# Patient Record
Sex: Male | Born: 1945 | Race: Black or African American | Hispanic: No | Marital: Married | Smoking: Former smoker
Health system: Southern US, Community
[De-identification: ages and names within clinical notes are randomized; demographics above are authoritative.]

## PROBLEM LIST (undated history)

## (undated) DIAGNOSIS — E785 Hyperlipidemia, unspecified: Secondary | ICD-10-CM

## (undated) DIAGNOSIS — M1990 Unspecified osteoarthritis, unspecified site: Secondary | ICD-10-CM

## (undated) DIAGNOSIS — F4310 Post-traumatic stress disorder, unspecified: Secondary | ICD-10-CM

## (undated) DIAGNOSIS — Z9989 Dependence on other enabling machines and devices: Secondary | ICD-10-CM

## (undated) DIAGNOSIS — G4733 Obstructive sleep apnea (adult) (pediatric): Secondary | ICD-10-CM

## (undated) DIAGNOSIS — H9190 Unspecified hearing loss, unspecified ear: Secondary | ICD-10-CM

## (undated) DIAGNOSIS — I10 Essential (primary) hypertension: Secondary | ICD-10-CM

## (undated) DIAGNOSIS — M109 Gout, unspecified: Secondary | ICD-10-CM

## (undated) DIAGNOSIS — C801 Malignant (primary) neoplasm, unspecified: Secondary | ICD-10-CM

## (undated) DIAGNOSIS — R0602 Shortness of breath: Secondary | ICD-10-CM

## (undated) HISTORY — PX: TONSILLECTOMY: SUR1361

## (undated) HISTORY — PX: SHOULDER OPEN ROTATOR CUFF REPAIR: SHX2407

## (undated) HISTORY — PX: JOINT REPLACEMENT: SHX530

## (undated) HISTORY — PX: TOTAL KNEE ARTHROPLASTY: SHX125

---

## 1968-12-21 HISTORY — PX: INGUINAL HERNIA REPAIR: SUR1180

## 2002-01-20 ENCOUNTER — Ambulatory Visit (HOSPITAL_COMMUNITY): Admission: RE | Admit: 2002-01-20 | Discharge: 2002-01-20 | Payer: Self-pay | Admitting: Gastroenterology

## 2002-04-01 ENCOUNTER — Ambulatory Visit (HOSPITAL_COMMUNITY): Admission: RE | Admit: 2002-04-01 | Discharge: 2002-04-01 | Payer: Self-pay | Admitting: Internal Medicine

## 2002-04-01 ENCOUNTER — Encounter: Payer: Self-pay | Admitting: Internal Medicine

## 2002-04-09 ENCOUNTER — Encounter: Admission: RE | Admit: 2002-04-09 | Discharge: 2002-04-09 | Payer: Self-pay | Admitting: Internal Medicine

## 2002-04-09 ENCOUNTER — Encounter: Payer: Self-pay | Admitting: Internal Medicine

## 2002-07-26 ENCOUNTER — Encounter: Payer: Self-pay | Admitting: Specialist

## 2002-07-28 ENCOUNTER — Observation Stay (HOSPITAL_COMMUNITY): Admission: RE | Admit: 2002-07-28 | Discharge: 2002-07-29 | Payer: Self-pay | Admitting: Specialist

## 2005-02-05 ENCOUNTER — Ambulatory Visit (HOSPITAL_BASED_OUTPATIENT_CLINIC_OR_DEPARTMENT_OTHER): Admission: RE | Admit: 2005-02-05 | Discharge: 2005-02-05 | Payer: Self-pay | Admitting: Internal Medicine

## 2005-02-17 ENCOUNTER — Ambulatory Visit: Payer: Self-pay | Admitting: Internal Medicine

## 2005-03-27 ENCOUNTER — Ambulatory Visit (HOSPITAL_COMMUNITY): Admission: RE | Admit: 2005-03-27 | Discharge: 2005-03-27 | Payer: Self-pay | Admitting: Urology

## 2006-04-22 DIAGNOSIS — C801 Malignant (primary) neoplasm, unspecified: Secondary | ICD-10-CM

## 2006-04-22 HISTORY — PX: ROBOT ASSISTED LAPAROSCOPIC RADICAL PROSTATECTOMY: SHX5141

## 2006-04-22 HISTORY — DX: Malignant (primary) neoplasm, unspecified: C80.1

## 2006-10-13 ENCOUNTER — Emergency Department (HOSPITAL_COMMUNITY): Admission: EM | Admit: 2006-10-13 | Discharge: 2006-10-13 | Payer: Self-pay | Admitting: Emergency Medicine

## 2006-10-27 ENCOUNTER — Ambulatory Visit (HOSPITAL_COMMUNITY): Admission: RE | Admit: 2006-10-27 | Discharge: 2006-10-27 | Payer: Self-pay | Admitting: Urology

## 2006-11-20 ENCOUNTER — Ambulatory Visit (HOSPITAL_COMMUNITY): Admission: RE | Admit: 2006-11-20 | Discharge: 2006-11-21 | Payer: Self-pay | Admitting: Specialist

## 2007-04-06 ENCOUNTER — Ambulatory Visit (HOSPITAL_COMMUNITY): Admission: RE | Admit: 2007-04-06 | Discharge: 2007-04-06 | Payer: Self-pay | Admitting: Urology

## 2007-06-12 ENCOUNTER — Ambulatory Visit (HOSPITAL_COMMUNITY): Admission: RE | Admit: 2007-06-12 | Discharge: 2007-06-13 | Payer: Self-pay | Admitting: Specialist

## 2007-08-12 ENCOUNTER — Inpatient Hospital Stay (HOSPITAL_COMMUNITY): Admission: RE | Admit: 2007-08-12 | Discharge: 2007-08-13 | Payer: Self-pay | Admitting: Urology

## 2007-08-12 ENCOUNTER — Encounter (INDEPENDENT_AMBULATORY_CARE_PROVIDER_SITE_OTHER): Payer: Self-pay | Admitting: Urology

## 2007-09-30 ENCOUNTER — Ambulatory Visit: Admission: RE | Admit: 2007-09-30 | Discharge: 2007-12-29 | Payer: Self-pay | Admitting: Radiation Oncology

## 2007-12-30 ENCOUNTER — Ambulatory Visit: Admission: RE | Admit: 2007-12-30 | Discharge: 2008-01-19 | Payer: Self-pay | Admitting: Radiation Oncology

## 2008-02-14 IMAGING — CR DG CHEST 2V
2 series · 2 of 2 positions shown · non-contrast
Comparison: 07/26/02.

CLINICAL DATA: Preop left rotator cuff tear.
 CHEST - 2 VIEW:

[view not recorded (1 of 2)]
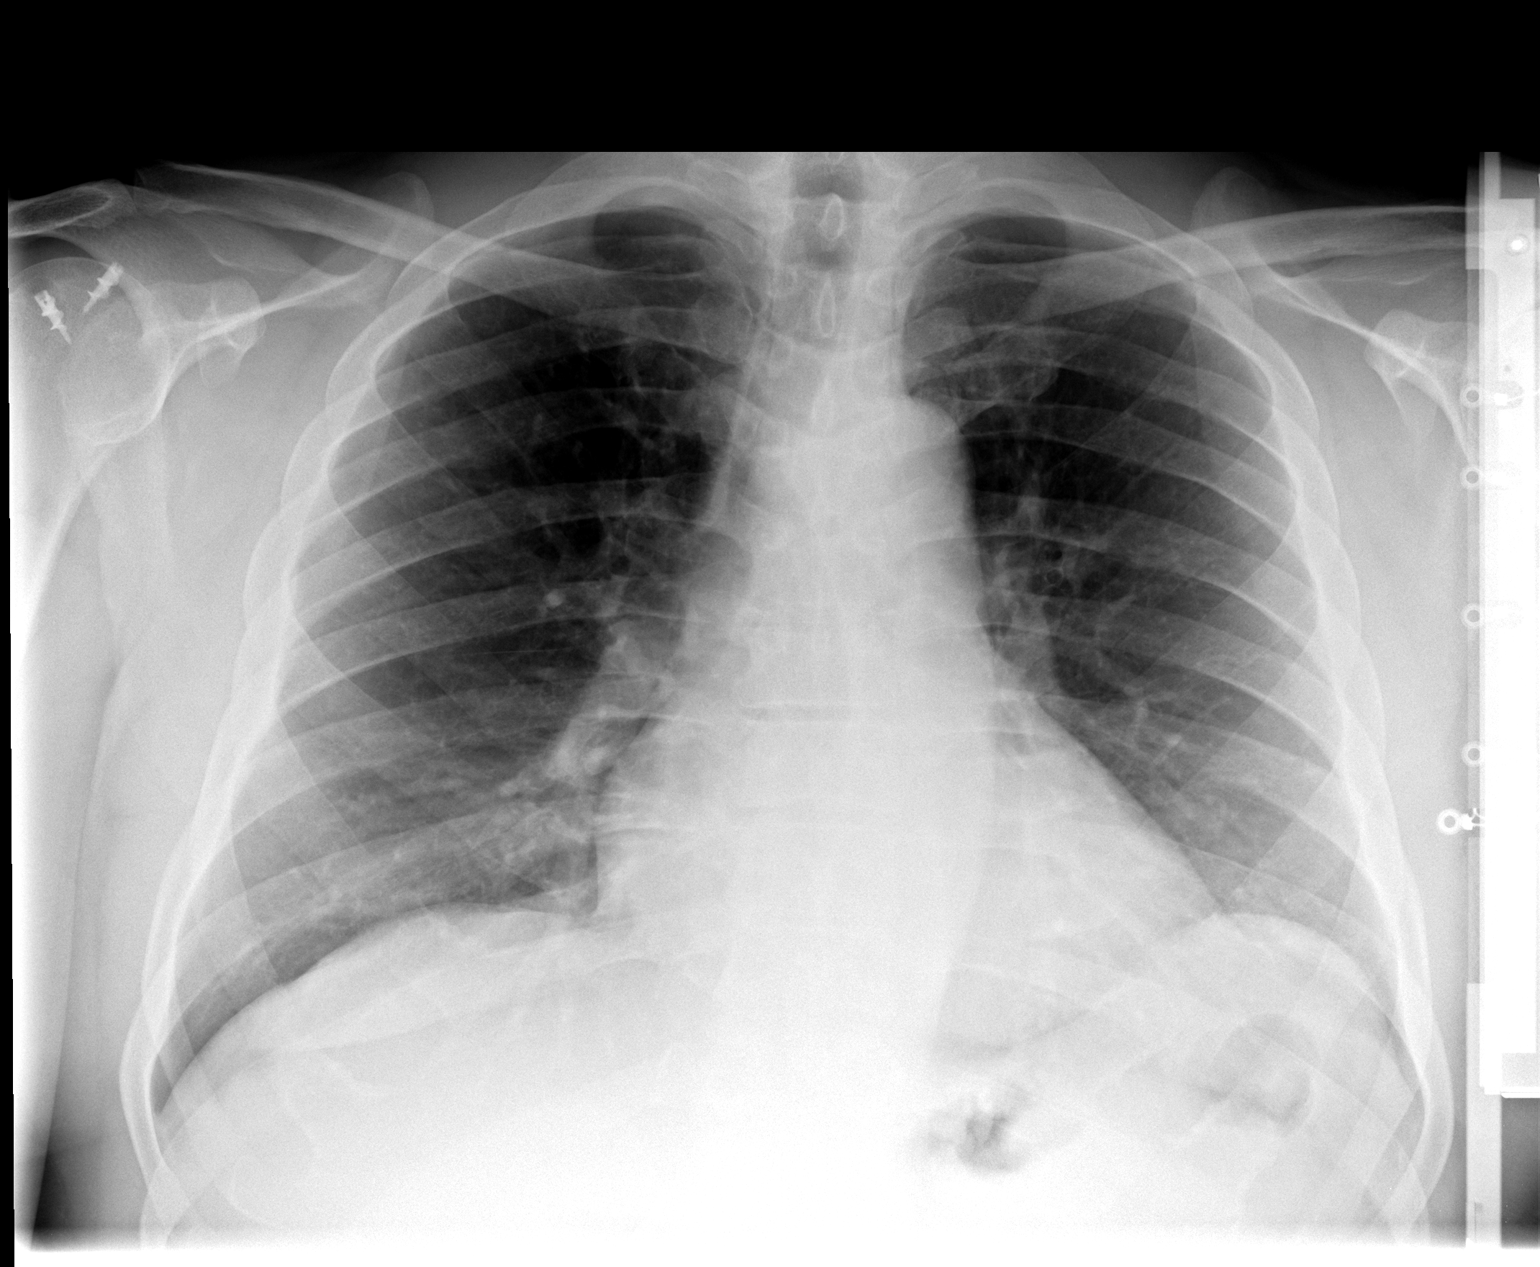

[view not recorded (2 of 2)]
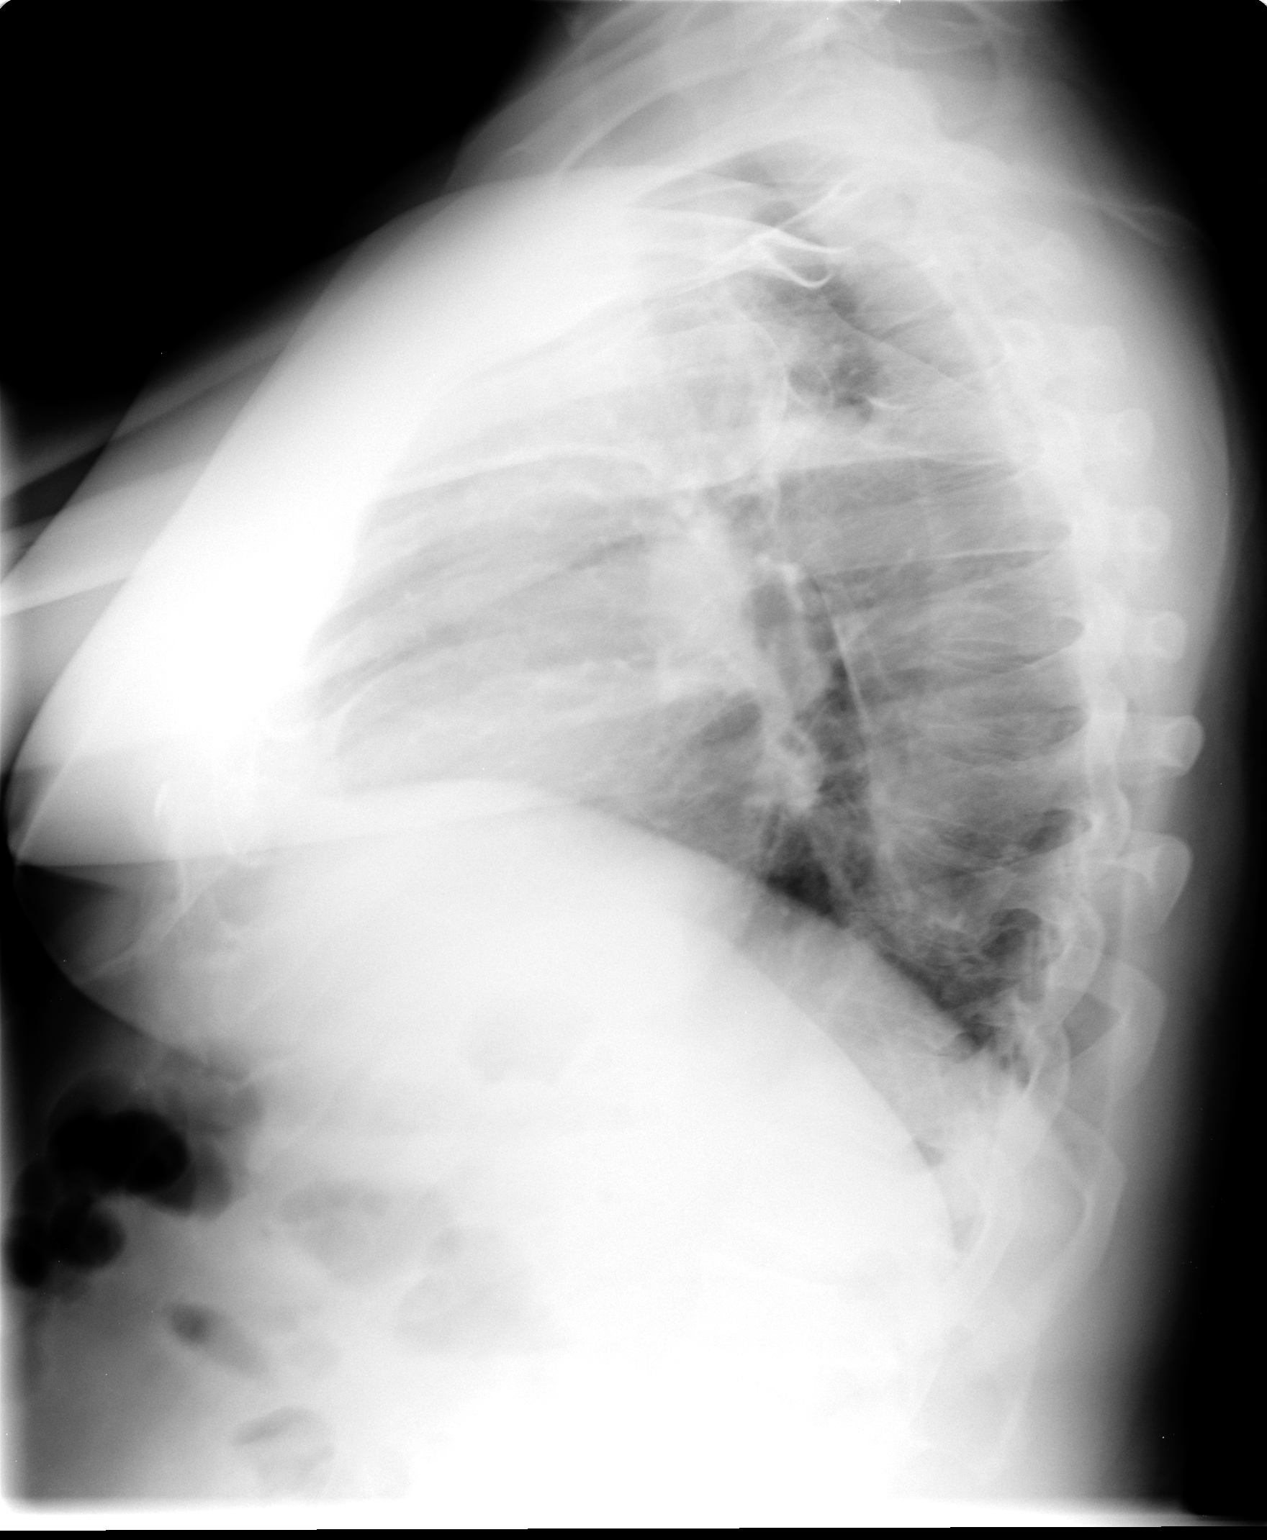

[2 of 2 positions shown; findings below may reference images not displayed]

FINDINGS: Trachea is midline. Heart size stable. Lungs are clear. No pleural fluid.
IMPRESSION: No acute findings.

## 2008-11-16 ENCOUNTER — Inpatient Hospital Stay (HOSPITAL_COMMUNITY): Admission: RE | Admit: 2008-11-16 | Discharge: 2008-11-18 | Payer: Self-pay | Admitting: Orthopedic Surgery

## 2010-05-14 ENCOUNTER — Encounter: Payer: Self-pay | Admitting: Orthopedic Surgery

## 2010-05-25 ENCOUNTER — Encounter (HOSPITAL_COMMUNITY): Payer: Medicare Other

## 2010-05-25 ENCOUNTER — Other Ambulatory Visit: Payer: Self-pay | Admitting: Orthopedic Surgery

## 2010-05-25 DIAGNOSIS — Z01812 Encounter for preprocedural laboratory examination: Secondary | ICD-10-CM | POA: Insufficient documentation

## 2010-05-25 LAB — COMPREHENSIVE METABOLIC PANEL
AST: 17 U/L (ref 0–37)
Albumin: 4.3 g/dL (ref 3.5–5.2)
Alkaline Phosphatase: 45 U/L (ref 39–117)
Chloride: 102 mEq/L (ref 96–112)
GFR calc Af Amer: 56 mL/min — ABNORMAL LOW (ref >60)
Potassium: 4.1 mEq/L (ref 3.5–5.1)
Sodium: 140 mEq/L (ref 135–145)
Total Bilirubin: 0.5 mg/dL (ref 0.3–1.2)
Total Protein: 7 g/dL (ref 6.0–8.3)

## 2010-05-25 LAB — URINALYSIS, ROUTINE W REFLEX MICROSCOPIC
Bilirubin Urine: NEGATIVE
Hgb urine dipstick: NEGATIVE
Ketones, ur: NEGATIVE mg/dL
Specific Gravity, Urine: 1.009 (ref 1.005–1.030)
Urine Glucose, Fasting: NEGATIVE mg/dL
pH: 6 (ref 5.0–8.0)

## 2010-05-25 LAB — CBC
HCT: 41.2 % (ref 39.0–52.0)
Hemoglobin: 14.1 g/dL (ref 13.0–17.0)
MCH: 27.6 pg (ref 26.0–34.0)
MCV: 80.8 fL (ref 78.0–100.0)
Platelets: 166 10*3/uL (ref 150–400)
RBC: 5.1 MIL/uL (ref 4.22–5.81)

## 2010-05-25 LAB — APTT: aPTT: 35 seconds (ref 24–37)

## 2010-05-25 LAB — PROTIME-INR: INR: 1.07 (ref 0.00–1.49)

## 2010-05-30 ENCOUNTER — Other Ambulatory Visit (HOSPITAL_COMMUNITY): Payer: Self-pay | Admitting: Orthopedic Surgery

## 2010-05-30 ENCOUNTER — Inpatient Hospital Stay (HOSPITAL_COMMUNITY)
Admission: RE | Admit: 2010-05-30 | Discharge: 2010-06-01 | DRG: 470 | Disposition: A | Discharge status: Home Health | Payer: Medicare Other | Source: Ambulatory Visit | Attending: Orthopedic Surgery | Admitting: Orthopedic Surgery

## 2010-05-30 ENCOUNTER — Inpatient Hospital Stay (HOSPITAL_COMMUNITY): Payer: Medicare Other

## 2010-05-30 ENCOUNTER — Ambulatory Visit (HOSPITAL_COMMUNITY)
Admission: RE | Admit: 2010-05-30 | Discharge: 2010-05-30 | Disposition: A | Discharge status: Home / Self Care | Payer: Medicare Other | Source: Ambulatory Visit | Attending: Orthopedic Surgery | Admitting: Orthopedic Surgery

## 2010-05-30 DIAGNOSIS — M1710 Unilateral primary osteoarthritis, unspecified knee: Principal | ICD-10-CM | POA: Diagnosis present

## 2010-05-30 DIAGNOSIS — M109 Gout, unspecified: Secondary | ICD-10-CM | POA: Diagnosis present

## 2010-05-30 DIAGNOSIS — F329 Major depressive disorder, single episode, unspecified: Secondary | ICD-10-CM | POA: Diagnosis present

## 2010-05-30 DIAGNOSIS — Z8546 Personal history of malignant neoplasm of prostate: Secondary | ICD-10-CM

## 2010-05-30 DIAGNOSIS — F3289 Other specified depressive episodes: Secondary | ICD-10-CM | POA: Diagnosis present

## 2010-05-30 DIAGNOSIS — I10 Essential (primary) hypertension: Secondary | ICD-10-CM | POA: Diagnosis present

## 2010-05-30 DIAGNOSIS — Z01812 Encounter for preprocedural laboratory examination: Secondary | ICD-10-CM

## 2010-05-30 DIAGNOSIS — F4310 Post-traumatic stress disorder, unspecified: Secondary | ICD-10-CM | POA: Diagnosis present

## 2010-05-30 DIAGNOSIS — Z96659 Presence of unspecified artificial knee joint: Secondary | ICD-10-CM

## 2010-05-30 LAB — TYPE AND SCREEN
ABO/RH(D): A POS
ABO/RH(D): A POS
Antibody Screen: NEGATIVE
Antibody Screen: NEGATIVE

## 2010-05-31 LAB — CBC
MCH: 26.5 pg (ref 26.0–34.0)
MCV: 80.6 fL (ref 78.0–100.0)
Platelets: 140 10*3/uL — ABNORMAL LOW (ref 150–400)
RBC: 4.53 MIL/uL (ref 4.22–5.81)
RDW: 13.2 % (ref 11.5–15.5)
WBC: 6.5 10*3/uL (ref 4.0–10.5)

## 2010-05-31 LAB — BASIC METABOLIC PANEL
CO2: 31 mEq/L (ref 19–32)
Calcium: 8.8 mg/dL (ref 8.4–10.5)
Creatinine, Ser: 1.28 mg/dL (ref 0.4–1.5)
GFR calc Af Amer: >60 mL/min (ref >60)
GFR calc non Af Amer: 57 mL/min — ABNORMAL LOW (ref >60)
Glucose, Bld: 142 mg/dL — ABNORMAL HIGH (ref 70–99)
Sodium: 135 mEq/L (ref 135–145)

## 2010-05-31 LAB — PROTIME-INR: Prothrombin Time: 15.9 seconds — ABNORMAL HIGH (ref 11.6–15.2)

## 2010-06-01 LAB — BASIC METABOLIC PANEL
BUN: 9 mg/dL (ref 6–23)
Calcium: 8.9 mg/dL (ref 8.4–10.5)
Creatinine, Ser: 1.16 mg/dL (ref 0.4–1.5)
GFR calc non Af Amer: >60 mL/min (ref >60)
Glucose, Bld: 117 mg/dL — ABNORMAL HIGH (ref 70–99)

## 2010-06-01 LAB — CBC
Hemoglobin: 12 g/dL — ABNORMAL LOW (ref 13.0–17.0)
MCH: 27 pg (ref 26.0–34.0)
MCHC: 33.5 g/dL (ref 30.0–36.0)
Platelets: 131 10*3/uL — ABNORMAL LOW (ref 150–400)
RDW: 13 % (ref 11.5–15.5)

## 2010-06-01 LAB — PROTIME-INR
INR: 1.24 (ref 0.00–1.49)
Prothrombin Time: 15.8 seconds — ABNORMAL HIGH (ref 11.6–15.2)

## 2010-06-11 NOTE — Op Note (Signed)
NAME:  Mathew Malone, Mathew Malone NO.:  [id number]  MEDICAL RECORD NO.:  [id number]           PATIENT TYPE:  I  LOCATION:  5004                         FACILITY:  [facility]  PHYSICIAN:  Loreta Ave, M.D. DATE OF BIRTH:  1946-04-08  DATE OF PROCEDURE:  05/30/2010 DATE OF DISCHARGE:                              OPERATIVE REPORT   PREOPERATIVE DIAGNOSIS:  End-stage degenerative arthritis, left knee varus alignment.  POSTOPERATIVE DIAGNOSIS:  End-stage degenerative arthritis, left knee varus alignment.  PROCEDURE:  Modified minimally invasive left total knee replacement. Soft tissue balancing.  Stryker triathlon prosthesis.  Cemented pegged posterior stabilized #6 femoral component.  Cemented #6 tibial component, 9-mm polyethylene insert.  Cemented resurfacing 38-mm patellar component.  SURGEON:  Loreta Ave, MD  ASSISTANT:  Genene Churn. Barry Dienes, [place], present throughout the entire case and necessary for timely completion of procedure.  ANESTHESIA:  General.  BLOOD LOSS:  Minimal.  SPECIMENS:  None.  CULTURES:  None.  COMPLICATIONS:  None.  DRESSING:  Soft compressive knee immobilizer.  DRAIN:  Hemovac x1.  TOURNIQUET TIME:  One hour and 10 minutes.  PROCEDURE:  The patient brought to the operating room, and after adequate anesthesia had been obtained, knee examined.  Just about full extension, varus alignment, correctable, flexion better than 100 degree. Tourniquet applied.  Prepped and draped in usual sterile fashion. Exsanguinated with elevation Esmarch, tourniquet inflated to 350 mmHg. Straight incision above the patella down to tibial tubercle.  Medial arthrotomy vastus splitting preserving quad tendon.  Remnants of menisci, periarticular spurs, cruciate ligaments removed.  Distal femur exposed.  Intramedullary guide.  A 10-mm resection, 5 degrees of valgus. Using epicondylar axis, sized, cut, and fitted for a posterior stabilized #6 component.   Proximal tibial resection extramedullary guide 3-degree posterior slope cut.  Sized to a #6 component as well.  Medial capsule release had already been done.  Patella exposed, posterior 10 mm removed.  Drilled, sized, and fitted for a 38-mm component.  Trial was put in place.  The #6 component on the femur, #6 on the tibia, and a 9- mm insert.  A 38 mm of the patella.  Tibia was marked for rotation. Very pleased with mechanical axis, alignment, stability, patellar tracking.  All trials had been removed and the tibia punched, rotation with several trials.  Copious irrigation with the pulse irrigating device.  Cement prepared and placed on all components, firmly seated. Polyethylene was attached to tibia, knee reduced.  Patella held with a clamp.  After the cement hardened, we examined.  Again pleased with mechanical axis, alignment, stability, motion, and tracking.  Hemovac placed through a separate stab wound.  Arthrotomy closed with #1 Vicryl. Skin and subcutaneous tissue with Vicryl and staples.  Knee injected with Marcaine and Hemovac clamp.  Sterile compressive dressing applied. Tourniquet deflated and removed.  Knee immobilizer applied.  Anesthesia reversed.  Brought to recovery room.  Tolerated surgery well.  No complications.     Loreta Ave, M.D.     DFM/MEDQ  D:  05/30/2010  T:  05/31/2010  Job:  [id number]  Electronically Signed by Reuel Boom  Enrika Aguado M.D. on 06/11/2010 02:58:55 PM

## 2010-07-29 LAB — APTT: aPTT: 42 seconds — ABNORMAL HIGH (ref 24–37)

## 2010-07-29 LAB — CBC
HCT: 32.5 % — ABNORMAL LOW (ref 39.0–52.0)
HCT: 34.8 % — ABNORMAL LOW (ref 39.0–52.0)
HCT: 40.8 % (ref 39.0–52.0)
Hemoglobin: 11.8 g/dL — ABNORMAL LOW (ref 13.0–17.0)
MCHC: 33.9 g/dL (ref 30.0–36.0)
MCHC: 33.9 g/dL (ref 30.0–36.0)
MCV: 85.9 fL (ref 78.0–100.0)
MCV: 86.3 fL (ref 78.0–100.0)
Platelets: 138 10*3/uL — ABNORMAL LOW (ref 150–400)
Platelets: 180 10*3/uL (ref 150–400)
RDW: 13.3 % (ref 11.5–15.5)
RDW: 13.3 % (ref 11.5–15.5)
WBC: 5.8 10*3/uL (ref 4.0–10.5)

## 2010-07-29 LAB — DIFFERENTIAL
Basophils Relative: 0 % (ref 0–1)
Eosinophils Relative: 2 % (ref 0–5)
Monocytes Absolute: 0.5 10*3/uL (ref 0.1–1.0)
Monocytes Relative: 8 % (ref 3–12)
Neutro Abs: 4.3 10*3/uL (ref 1.7–7.7)

## 2010-07-29 LAB — COMPREHENSIVE METABOLIC PANEL
AST: 24 U/L (ref 0–37)
Albumin: 4.4 g/dL (ref 3.5–5.2)
Alkaline Phosphatase: 46 U/L (ref 39–117)
BUN: 13 mg/dL (ref 6–23)
Chloride: 101 mEq/L (ref 96–112)
GFR calc Af Amer: >60 mL/min (ref >60)
Potassium: 4.2 mEq/L (ref 3.5–5.1)
Sodium: 137 mEq/L (ref 135–145)
Total Protein: 6.9 g/dL (ref 6.0–8.3)

## 2010-07-29 LAB — URINALYSIS, ROUTINE W REFLEX MICROSCOPIC
Bilirubin Urine: NEGATIVE
Glucose, UA: NEGATIVE mg/dL
Nitrite: NEGATIVE
Specific Gravity, Urine: 1.028 (ref 1.005–1.030)
pH: 7 (ref 5.0–8.0)

## 2010-07-29 LAB — BASIC METABOLIC PANEL
BUN: 9 mg/dL (ref 6–23)
CO2: 31 mEq/L (ref 19–32)
CO2: 31 mEq/L (ref 19–32)
Calcium: 9 mg/dL (ref 8.4–10.5)
Chloride: 100 mEq/L (ref 96–112)
Chloride: 98 mEq/L (ref 96–112)
Creatinine, Ser: 1.05 mg/dL (ref 0.4–1.5)
Glucose, Bld: 131 mg/dL — ABNORMAL HIGH (ref 70–99)
Glucose, Bld: 141 mg/dL — ABNORMAL HIGH (ref 70–99)
Potassium: 3.5 mEq/L (ref 3.5–5.1)
Sodium: 137 mEq/L (ref 135–145)

## 2010-07-29 LAB — TYPE AND SCREEN: Antibody Screen: NEGATIVE

## 2010-07-29 LAB — PROTIME-INR: Prothrombin Time: 16.1 seconds — ABNORMAL HIGH (ref 11.6–15.2)

## 2010-09-04 NOTE — Op Note (Signed)
NAME:  Mathew Malone, Mathew Malone NO.:  [id number]   MEDICAL RECORD NO.:  [id number]          PATIENT TYPE:  OIB   LOCATION:  1603                         FACILITY:  [facility]   PHYSICIAN:  Jene Every, M.D.    DATE OF BIRTH:  June 08, 1945   DATE OF PROCEDURE:  06/12/2007  DATE OF DISCHARGE:                               OPERATIVE REPORT   PREOPERATIVE DIAGNOSIS:  Impingement syndrome, rotator cuff arthropathy,  possible rotator cuff to the right shoulder.   POSTOPERATIVE DIAGNOSIS:  Impingement syndrome, rotator cuff  arthropathy, possible rotator cuff to the right shoulder, recurrent  small rotator cuff tear.   PROCEDURE PERFORMED:  Right arthroscopy, debridement of glenoid labral  tear, followed by subacromial debridement and decompression followed by  mini open re-do rotator cuff repair, debridement degenerative labrum of  the glenohumeral joint.   ANESTHESIA:  General.   SURGEON:  Jene Every, M.D.   ASSISTANT:  Roma Schanz, P.A.   BRIEF HISTORY AND INDICATION:  This is a 64 year old male with a history  of shoulder repair in the past that has had recurrent injury and pain.  MRI indicating rotator cuff arthropathy.  No evidence of definitive  tear.  He had radiographs which indicated previous suture anchors.  There was a medial suture anchor that was just a mm proud of the,  appeared to be a mm proud of the bony surface.  He was indicated for  diagnostic arthroscopy, debridement, possible open rotator cuff repair.  Risks and benefits were discussed including bleeding, infection, need  for open repair, DVT, PE or anesthetic complications, etc.   PROCEDURE IN DETAIL:  With the patient in supine beach-chair position  after adequate anesthesia, 1 gram Kefzol, the right shoulder and upper  extremity was prepped and draped in the usual sterile fashion.  We  ranged the shoulder.  There was no significant restriction in his range  of motion.  I used a standard  posterolateral incision through the skin  and portal and anterolateral incision through the skin and that portal.  We placed the arm in the 70/30 position and advanced the arthroscopic  cannula in the glenohumeral space, penetrated atraumatically.  Then made  a small anterior portal near the anterolateral aspect of the acromion  and under direct visualization passed the blunt cannula into the  glenohumeral joint just beneath the biceps tendon under direct  visualization.  Noted was just some degenerative fraying throughout the  whole biceps tendon.  There was degenerative tearing of the labrum.  Collene Mares was introduced and I shaved and debrided this to a stable base.  The base was probed and there was no detachment noted.  There was some  glenohumeral arthrosis noted.  We looked at the rotator cuff and there  appeared to be a small tearing in the rotator cuff noted.  Subscap was  unremarkable.  I redirected the camera in the subacromial space and used  the lateral portal.  There was hypertrophic synovitis and the shaver was  introduced and utilized to debride and shave this and redetached the CA  ligament.  With arthroscopic  evaluation there was a small what appeared  to be a full thickness tear longitudinally.  I then converted this into  a mini open and used the previous incision after quarter percent of  Marcaine with epinephrine was infiltrated into the joint.  I made a  small incision and developed the raphe between the anterolateral heads  and subperiosteally elevated over the lateral and anterolateral aspect  of the acromion.  I preserved the detachment and self-retaining  retractor was then placed.  The patient had a fairly tight subacromial  space and I digitally lysed adhesions here.  I removed a small spur with  a 3 mm Kerrison.  A full inspection revealed a small longitudinal near  full thickness tear of the rotator cuff.  This was debrided and repaired  side-to-side with #1  Vicryl interrupted figure-of-eight sutures and 0  Vicryl simple sutures.  I then digitally palpated the entire subacromial  surface.  The slightly proud medial suture anchor was not noted to be  causing any impingement.  I felt that to open up the cuff and remove it  would not be necessary.  There was a lateral sloping acromion though.  He had a fairly thin acromion so I did not feel that further  acromioplasty would be appropriate.  We initially and externally rotated  and completed some bursectomy and I saw no evidence of a condition or  tear.  The rotator cuff was just attenuated throughout.  I copiously  irrigated the wound.  Inspection revealed no tension on this side.  Again, it was longitudinal and side-to-side and no need for suture  anchors.  I repaired the raphe with #1 Vicryl interrupted figure-of-  eight sutures over the top and through the acromion.  Subcutaneous  tissue were reapproximated with 2-0 Vicryl and subcutaneous and skin  reapproximated with staples and dressed sterilely.  He was placed on an  abduction pillow, extubated without difficulty and transported to the  recovery room in satisfactory condition.  The patient tolerated the  procedure without complications.      Jene Every, M.D.  Electronically Signed     JB/MEDQ  D:  06/12/2007  T:  06/13/2007  Job:  [id number]

## 2010-09-04 NOTE — Op Note (Signed)
NAME:  RONTE, PARKER NO.:  [id number]   MEDICAL RECORD NO.:  [id number]          PATIENT TYPE:  OIB   LOCATION:  1534                         [facility]ACILITY:  [facility]   PHYSICIAN:  Jene Every, M.D.    DATE OF BIRTH:  [birth year]   DATE OF PROCEDURE:  11/20/2006  DATE OF DISCHARGE:                               OPERATIVE REPORT   PREOPERATIVE DIAGNOSIS:  Recurrent rotator cuff tear, left.   POSTOPERATIVE DIAGNOSIS:  Recurrent rotator cuff tear, left.   PROCEDURES:  1. Redo rotator cuff repair, left, utilizing two Mitek suture anchors.  2. Acromioplasty.   ANESTHESIA:  General.   ASSISTANT:  Roma Schanz, P.A.   BRIEF HISTORY:  The patient is a 65 year old with a history of rotator  cuff tear repaired, done well, recurrent tear was noted.  He is  indicated for repair.  Risks and benefits discussed including bleeding,  infection, suboptimal range of motion, need for revision, etc.   TECHNIQUE:  The patient in supine beach-chair position.  After induction  of adequate anesthesia and 1 gram Kefzol, he was placed in beach chair  position, left shoulder and upper extremities prepped and draped in the  usual sterile fashion.  Incision was made over the anterior aspect of  the acromion in Langer's lines utilizing preexisting surgical incision  slightly extending it distal approximated a centimeter.  Subcutaneous  tissue was dissected.  Electrocautery was used to achieve hemostasis.  The raphe between the anterior and lateral heads of the deltoid was  identified and divided.  Subperiosteal elevated from the anterolateral  aspect of the acromion.  We used a 3 mm Kerrison to remove an  anterolateral spur from the acromion.  We then used digital dissection  to lyse adhesions and to free up the subacromial space.  There was a  tear in the supraspinatus in its anterior attachment.  Slight retracted,  it was mobilized with an Allis laterally to the greater tuberosity.  There was some thinning of the lateral aspect of the tendon.  After  digital mobilization, we created a trough just at the junction between  the articular surface and the greater tuberosity.  This was with a Midwife.  Good bleeding tissue was identified.  We placed two Mitek  suture anchors approximately 2 cm apart in the bone with good purchase.  I was unable to pull out with testing.  We then advanced the tendon into  the trough and threaded the suture through good thick rotator cuff  tissue and advanced it and tied it with excellent purchase.  Full  coverage was noted, laterally was oversewn with 0 Vicryl interrupted  figure-of-eight sutures.  Full inspection revealed a complete coverage  and repair of the tear.  Next, wound was copiously irrigated.  Repaired  the raphe with #1 Vicryl figure-of-eight sutures.  Subcutaneous tissue  reapproximated with 2-0 Vicryl simple sutures as was the deltotrapezial  fascia.  Skin was reapproximate with 4-0 subcuticular  Prolene.  Wound reinforced with Steri-Strips.  Sterile dressing applied.  He was placed in an abduction pillow sling, extubated without  difficulty  and transported to the recovery room in satisfactory condition.   The patient tolerated the procedure well.  No complications.      Jene Every, M.D.  Electronically Signed     JB/MEDQ  D:  11/20/2006  T:  11/20/2006  Job:  [id number]

## 2010-09-04 NOTE — Op Note (Signed)
NAMECURLIE, Mathew NO.:  [id number]   MEDICAL RECORD NO.:  [id number]          PATIENT TYPE:  INP   LOCATION:  5030                         FACILITY:  [facility]   PHYSICIAN:  Loreta Ave, M.D. DATE OF BIRTH:  31-Oct-1945   DATE OF PROCEDURE:  11/16/2008  DATE OF DISCHARGE:                               OPERATIVE REPORT   PREOPERATIVE DIAGNOSIS:  End-stage degenerative arthritis right knee.  Varus alignment.   POSTOPERATIVE DIAGNOSIS:  End-stage degenerative arthritis right knee.  Varus alignment.   PROCEDURE:  Right total knee replacement Stryker Triathlon prosthesis.  Modified minimally invasive approach.  Cemented pegged posterior  stabilized #6 femoral component.  Cemented #6 tibial component with a 9-  mm polyethylene insert.  Cemented pegged medial offset 38-mm patellar  component.  Soft tissue balancing.   SURGEON:  Loreta Ave, MD   ASSISTANT:  Zonia Kief, PA present throughout the entire case necessary  for timely completion of procedure.   ANESTHESIA:  General.   BLOOD LOSS:  Minimal.   SPECIMENS:  None.   CULTURES:  None.   COMPLICATIONS:  None.   DRESSING:  Soft compressive with knee immobilizer.   DRAIN:  Hemovac x1.   TOURNIQUET TIME:  1 hour 10 minutes.   PROCEDURE:  The patient was brought to the operating room and placed on  the operating table in supine position.  After adequate anesthesia have  been obtained, knee examined.  Varus alignment.  A little correctable  minimal flexion contracture further flexion 100 degrees.  Stable  ligaments.  Tourniquet applied, prepped and draped in usual sterile  fashion.  Exsanguinated with elevation of Esmarch, tourniquet inflated  to 350 mmHg.  Straight incision above the patella down to the tibial  tubercle.  Medial arthrotomy vastus splitting preserving quad tendon.  Knee exposed.  Grade 4 changes most marked in the medial compartment.  Remnants of menisci, cruciate ligaments,  loose body spurs removed.  Intramedullary guide on the femur.  10-mm resection set at 5 degrees of  valgus.  Using epicondylar axis the femur was sized, cut and fitted with  a #6 component.  Extramedullary guide on the tibia.  3 degree posterior  slope cut.  Sufficient resection for a 9-mm insert.  Sized to #6  component.  Patella exposed, posterior 10 mm removed, sized, drilled and  fitted with a 38-mm component.  Trials put in place.  #6 on the femur,  #6 on the tibia, 38 mm on the patella and a 9-mm insert on the tibia.  Very pleased with mechanical axis, alignment and stability and I had  already done medial capsule release.  Tibia was marked for appropriate  rotation and hand reamed.  All trials removed.  Copious irrigation.  Cement prepared and placed on all components which were firmly seated.  Polyethylene attached to tibia and knee reduced.  Patella held in place  with a clamp.  Once the cement hardened, I reexamined the knee, again  very pleased with alignment, stability, mechanical axis and  patellofemoral tracking.  Hemovac placed and brought out through a  separate stab wound.  Arthrotomy closed with #1 Vicryl, skin and  subcutaneous tissue with Vicryl and staples.  Knee injected with  Marcaine.  Hemovac clamp.  Sterile compressive dressing applied.  Tourniquet was deflated and removed.  Knee immobilizer applied.  Anesthesia reversed.  Brought to the recovery room.  Tolerated the  surgery well.  No complications.      Loreta Ave, M.D.  Electronically Signed     DFM/MEDQ  D:  11/17/2008  T:  11/18/2008  Job:  [id number]

## 2010-09-04 NOTE — Op Note (Signed)
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   PROCEDURES:  1. Robotic assisted laparoscopic radical prostatectomy (non-nerve      sparing).  2. Bilateral laparoscopic pelvic lymphadenectomy.   SURGEON:  Heloise Purpura, M.D.   ASSISTANT:  Delman Kitten, M.D.   ANESTHESIA:  General.   COMPLICATIONS:  None.   ESTIMATED BLOOD LOSS:  200 mL.   SPECIMENS:  1. Prostate and seminal vesicles.  2. Right pelvic lymph nodes.  3. Left pelvic lymph nodes.   DISPOSITION OF SPECIMENS:  To pathology.   DRAINS:  1. 20 French Coude catheter.  2. #19 Blake pelvic drain.   INDICATION:  Mathew Malone is a 65 year old gentleman who was initially  diagnosed with prostate cancer in 2004.  He elected to delay his therapy  and presented to me recently after undergoing a repeat biopsy by Dr.  Aldean Malone which demonstrated cancer progression and development of a  higher grade tumor.  He was restaged and again appeared to have a  clinically localized adenocarcinoma.  We, therefore, discussed the  management options for treatment and he elected to proceed with surgical  therapy.  The potential risks, complications, and alternative treatment  options were discussed with the patient and informed consent was  obtained.   DESCRIPTION OF PROCEDURE:  The patient was taken to the operating room  and a general anesthetic was administered.  He was given preoperative  antibiotics, placed in the dorsal  lithotomy position, prepped and draped  in the usual sterile fashion.  Next, a preoperative time out was  performed.  A Foley catheter was inserted into the bladder and a site  was selected just superior to the umbilicus for placement of the camera  port.  This was placed using a standard open Hassan technique.  This  allowed entry into the peritoneal cavity under direct vision without  difficulty.  A 12 mm port was then placed and a pneumoperitoneum was  established.  The 0 degrees lens was used to inspect the abdomen and  there was no evidence for any intra-abdominal injuries or other  abnormalities.  The remaining ports were then placed.  Bilateral 8 mm  robotic ports were placed 10 cm lateral to and just inferior to the  camera port site.  An additional 8 mm robotic port was placed in the far  left lateral abdominal wall.  A 5 mm port was placed between the camera  port and the right robotic port.  An additional 12 mm port was placed in  the far right lateral abdominal wall for laparoscopic  assistance.  All  ports were placed under direct vision without difficulty.   The surgical cart was then docked.  With the aid of the cautery  scissors, the bladder was reflected posteriorly allowing entry into the  space of Retzius and identification of the endopelvic fascia.  The  patient was noted to have an extremely narrow pelvis with an overhanging  pubic symphysis.  The endopelvic fascia was exposed and was incised from  the apex back to the base of the prostate bilaterally and the underlying  levator muscle fibers were swept laterally off the prostate.  This  helped isolate the dorsal venous complex which was then stapled and  divided with a 45 mm flex ETS stapler.  The bladder neck was identified  with the aid of Foley catheter manipulation and was divided anteriorly,  thereby exposing the catheter.  The catheter balloon was deflated and  the catheter was brought into the operative field  and used to retract  the prostate anteriorly.  This exposed the posterior bladder neck which  was divided and dissection proceeded between the bladder and prostate  until the vasa deferentia and seminal vesicles were identified.  The  vasa deferentia were isolated, divided and lifted anteriorly.  The  seminal vesicles were dissected down to their tips with care to control  the seminal vesicle arterial blood supply and then lifted anteriorly.  This exposed the space between Denonvilliers' fascia and the anterior  rectum was then bluntly developed thereby isolating the vascular  pedicles of the prostate which were then ligated in a wide non-nerve  sparing fashion with Hem-A-Lock clips.  The urethra was then sharply  transected and the prostate specimen was disarticulated.  The pelvis was  then copiously irrigated and air was injected into the rectal catheter.  There was no evidence of a rectal injury.  Hemostasis was ensured.   Attention was then turned to the right pelvic sidewall.  The fibrofatty  tissue between the external iliac vein, confluence of the iliac vessels,  Cooper's ligament, and hypogastric artery was dissected free from the  pelvic sidewall with care to preserve the obturator nerve.  Hem-A-Lock  clips were used for lymphostasis and hemostasis.  An identical procedure  was then performed on the contralateral side.  The lymphatic packets  were marked for later identification and were placed into the Endopouch  retrieval bag along with the prostate.   Attention was turned to the urethral anastomosis.  A 2-0 Vicryl slip  knot was placed between Denonvilliers' fascia, posterior bladder neck,  and posterior urethra to reapproximate these structures.  A double-armed  3-0 Monocryl suture was then used perform a 360 degrees running tension  free anastomosis between the bladder neck and urethra.  A new 20-French  Coude catheter was inserted into the bladder and irrigated.  There  were  a few blood clots that were irrigated until the catheter was irrigating  freely and clear.  The anastomosis appeared to be watertight.  A #19  Blake drain was then brought into the pelvis via the left lateral  robotic port and appropriately positioned in the pelvis.  It was secured  to the skin with a nylon suture.   The surgical cart was then undocked.  The right lateral 12 mm port site  was closed with a 0 Vicryl suture placed with the aid of the suture  passer device.  All remaining ports were removed under direct vision.  The prostate specimen and lymph node packets were  removed within the  Endopouch retrieval bag via the periumbilical port site and this fascial  opening was closed with a running 0 Vicryl suture.  All port sites were  injected with 0.25%  Marcaine and reapproximated at the skin level with staples.  Sterile  dressings were applied.  The patient appeared to tolerate the procedure  well without complications.  He was able to be extubated and transferred  to the recovery unit in satisfactory condition.      Heloise Purpura, MD  Electronically Signed     LB/MEDQ  D:  08/12/2007  T:  08/12/2007  Job:  [id number]

## 2010-09-07 NOTE — H&P (Signed)
NAMEVASILY, FEDEWA NO.:  [id number]   MEDICAL RECORD NO.:  [id number]                   PATIENT TYPE:   LOCATION:                                       FACILITY:   PHYSICIAN:  Jene Every, M.D.                 DATE OF BIRTH:  12/01/1945   DATE OF ADMISSION:  07/28/2002  DATE OF DISCHARGE:                                HISTORY & PHYSICAL   CHIEF COMPLAINT:  Right shoulder pain.   HISTORY:  The patient is a 65 year old gentleman who initially hurt his  shoulder while working at the [employer].  He has had gradual onset of pain  in both shoulders for the past three to four years.  He states the pain is  worse with activity.  MRI of the shoulder initially done in January this  year showed just degenerative changes of the left.  MRI of the right  shoulder shows a __________ acromion with AC hypertrophy; however, no full  thickness tear was seen at that time.  On examination the patient has  positive impingement.  The patient was treated conservatively with  corticosteroid injection and physical therapy activity.  Repeat MRI was  obtained of the shoulder which demonstrates a full thickness tear  anterolaterally with a lateral sloughing acromion, mild AC arthrosis,  fibrocystic changes in the humeral head.  Due to the fact the patient has  failed conservative treatment and that repeat MRI does show a full thickness  tear, it is recommended that he undergo rotator cuff repair with subacromial  decompression.  Risks and benefits of surgery were discussed with the  patient by Jene Every, M.D. and he wishes to proceed.   PAST MEDICAL HISTORY:  Osteoarthritis.   PAST SURGICAL HISTORY:  None.   CURRENT MEDICATIONS:  1. Over-the-counter allergy medications.  2. Ultracet p.r.n.  3. Vicodin p.r.n.   ALLERGIES:  No known drug allergies.   SOCIAL HISTORY:  The patient denies any tobacco use.  He does admit to  occasional alcohol intake.  He is  married.  His wife will be his care giver  following surgery.   FAMILY HISTORY:  Father deceased at age 57 from prostate cancer.  He also  had a history of diabetes.  Mother is deceased at age 42 from acute coronary  event with CVA.   REVIEW OF SYSTEMS:  GENERAL:  No fevers, chills, night sweats, or bleeding  tendencies.  CNS:  No blurred or double vision, seizure, headache, or  paralysis.  RESPIRATORY:  No shortness of breath, productive cough, or  hemoptysis.  CARDIOVASCULAR:  No chest pain, angina, orthopnea.  GENITOURINARY:  No dysuria, hematuria, or discharge.  GASTROINTESTINAL:  No  nausea, vomiting, constipation, melena, bloody stool.  MUSCULOSKELETAL:  As  pertinent to HPI.   PHYSICAL EXAMINATION:  VITAL SIGNS:  Pulse 88, respirations 16, blood  pressure 170/100, repeat blood  pressure 180/120.  GENERAL:  This is a well-developed, well-nourished 65 year old gentleman in  no acute distress.  HEENT:  Atraumatic, normocephalic.  Pupils equal, round, reactive to light.  EOMs intact.  NECK:  Supple.  No lymphadenopathy.  No carotid bruits can be appreciated.  CHEST:  Clear to auscultation bilaterally.  No rhonchi, wheezes, rales.  HEART:  Regular rate and rhythm without murmurs, gallops, or rubs.  ABDOMEN:  Soft, nontender, nondistended.  Bowel sounds x4.  SKIN:  No rashes or lesions are noted.  EXTREMITIES:  The patient has positive impingement sign on the right with  secondary impingement sign with decreased internal rotation.   IMPRESSION:  1. Right rotator cuff repair.  2. Possible new onset hypertension.   PLAN:  The patient will be admitted to [facility] on 07/28/2002, to  undergo a right rotator cuff repair and subacromial decompression by Jene Every, M.D.  Due to the hypertensive events during history and physical, the  patient was sent over to Dr. Patty Sermons office for evaluation of this.     Roma Schanz, P.A.                   Jene Every,  M.D.    CS/MEDQ  D:  07/20/2002  T:  07/20/2002  Job:  [id number]

## 2010-09-07 NOTE — Op Note (Signed)
NAME:  Mathew Malone, MOSCATO NO.:  [id number]   MEDICAL RECORD NO.:  [id number]                   PATIENT TYPE:  OBV   LOCATION:  0452                                 FACILITY:  [facility]   PHYSICIAN:  Jene Every, M.D.                 DATE OF BIRTH:  1945-10-02   DATE OF PROCEDURE:  07/28/2002  DATE OF DISCHARGE:                                 OPERATIVE REPORT   PREOPERATIVE DIAGNOSIS:  Rotator cuff tear with impingement syndrome, left  shoulder.   POSTOPERATIVE DIAGNOSIS:  Rotator cuff tear with impingement syndrome, left  shoulder.   PROCEDURE PERFORMED:  1. Open rotator cuff repair.  2. Subacromial decompression.  3. Bursectomy.  4. Repair of rotator cuff.   ANESTHESIA:  General.   SURGEON:  Javier Docker, M.D.   ASSISTANT:  Roma Schanz, P.A.   BRIEF HISTORY AND INDICATIONS:  A 65 year old with rotator cuff tear.  Operative intervention was indicated for decompression and repair of the  rotator cuff at the lateral slope in the acromion.  Risks and benefits  discussed including bleeding, infection, damage to neurovascular structures,  no change in symptoms, need for repeat repair, etc.   TECHNIQUE:  The patient supine in beach chair position.  After induction of  adequate general anesthesia and 1 g of Kefzol, the left shoulder and upper  extremity was prepped and draped in the usual sterile fashion.  A surgical  monitor was utilized to delineate the acromion, coracoid, and AC joint.  Incision was made in Langer's lines over the anterior aspect of the  acromion.  Subcutaneous tissue was dissected.  Electrocautery was utilized  to achieve hemostasis.  Raphe between the anterior and lateral heads of the  deltoid was identified and divided, subperiosteally elevated from the  anterior aspect of the acromion to the [facility] joint through an anterolateral  aspect of the acromion.  We detached and resected the CA ligament.  There  was hypertrophic  bursa which I also incised as well.  There was a full-  thickness tear of the rotator cuff, measuring 1.5 x 1.5 cm.  The rotator  cuff edges were debrided side-to-side.  It was longitudinal amount of  retraction.  Good bleeding tissue was obtained and was repaired with #1  Vicryl interrupted figure-of-eight sutures.  We decompressed further the  subacromial region digitally and had removed the anterior spur prior to the  rotator cuff and then used a high-speed bur to perform an acromioplasty to  decompress this area.  The spur was right over the rotator cuff tear.  After  digitally mobilizing the subacromial joint, we examined the rest of the  rotator cuff, but there was no evidence of a tear and looked healthy.  There  was no impingement underneath the [facility] joint either.  Wound copiously  irrigated, full range of motion without impingement noted.  No tension upon  the repair.  Then repaired the raphe with #1 Vicryl interrupted figure-of-  eight sutures.  The subcutaneous tissue reapproximated with 2-0 Vicryl  simple sutures, and skin was reapproximated with 4-0  subcuticular Prolene.  The wound was reinforced with Steri-Strips.  Sterile  dressing applied.  Abduction pillow placed.  He was extubated without  difficulty and transported to the recovery room in satisfactory condition.   The patient tolerated the procedure well with no known complications.                                               Jene Every, M.D.    Cordelia Pen  D:  07/28/2002  T:  07/28/2002  Job:  [id number]

## 2010-09-07 NOTE — Op Note (Signed)
NAME:  Mathew Malone, Mathew Malone NO.:  [id number]   MEDICAL RECORD NO.:  [id number]                   PATIENT TYPE:  AMB   LOCATION:  ENDO                                 FACILITY:  [facility]   PHYSICIAN:  Charna Elizabeth, M.D.                   DATE OF BIRTH:  1945/10/28   DATE OF PROCEDURE:  01/20/2002  DATE OF DISCHARGE:                                 OPERATIVE REPORT   PROCEDURE PERFORMED:  Colonoscopy.   ENDOSCOPIST:  Charna Elizabeth, M.D.   INSTRUMENT USED:  Olympus video colonoscope.   INDICATIONS FOR PROCEDURE:  The patient is a 65 year old African-American  male with guaiac positive stool.  Rule out colonic polyps, masses,  hemorrhoids, etc.   PREPROCEDURE PREPARATION:  Informed consent was procured from the patient.  The patient was fasted for eight hours prior to the procedure and prepped  with a bottle of magnesium citrate and a gallon of NuLytely the night prior  to the procedure.   PREPROCEDURE PHYSICAL:  The patient had stable vital signs. Neck supple.  Chest clear to auscultation.  S1 and S2 regular.  Abdomen soft with normal  bowel sounds.   DESCRIPTION OF PROCEDURE:  The patient was placed in left lateral decubitus  position and sedated with 70 mg of Demerol and 7 mg of Versed intravenously.  Once the patient was adequately sedated and maintained on low flow oxygen  and continuous cardiac monitoring, the Olympus video colonoscope was  advanced from the rectum to the cecum with difficulty.  There was a large  amount of residual debris in the colon.  Multiple washes were done.  The  patient's position was changed from the left lateral to the supine position  to adequately reach the area of the cecal base.  The appendicular orifice  and the ileocecal valve were clearly visualized and photographed.  The  entire colonic mucosa seemed almost black in color and therefore small  lesions could have been missed.  No large masses or polyps could be seen.  There was no evidence of hemorrhoids on retroflexion.  The patient tolerated  the procedure well without complication.  No diverticular disease was  appreciated.   IMPRESSION:  1. 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     The entire colonic mucosa appeared almost black.  2. No masses or polyps seen.  3. Large amount of solid debris in the colon.  Small lesion could have     missed.   RECOMMENDATIONS:  1. Repeat guaiacs on an outpatient basis.  2.     Further recommendation made thereafter.  3. Avoid laxative use.  4. High fiber diet with liberal fluid intake has been advocated.  Further     recommendations made as needed.  Charna Elizabeth, M.D.    JM/MEDQ  D:  01/20/2002  T:  01/20/2002  Job:  [id number]   cc:   Wilson Singer, M.D.

## 2010-09-07 NOTE — Procedures (Signed)
NAME:  Mathew Malone, Mathew Malone NO.:  [id number]   MEDICAL RECORD NO.:  [id number]          PATIENT TYPE:  OUT   LOCATION:  SLEEP CENTER                 FACILITY:  [facility]   PHYSICIAN:  Clinton D. Maple Hudson, M.D. DATE OF BIRTH:  December 25, 1945   DATE OF STUDY:  02/05/2005                              NOCTURNAL POLYSOMNOGRAM   REFERRING PHYSICIAN:  Dr. Lilly Cove.   INDICATION FOR SURGERY:  Hypersomnia with sleep apnea.   EPWORTH SLEEPINESS SCORE:  18/24, BMI 32.8, weight 230 pounds.   DAYTIME MEDICATION:  Listed as high blood pressure.   SLEEP ARCHITECTURE:  Total sleep time 324 minutes with sleep efficiency 79%.  Stage I was 7%, stage II 76%, stages III and IV absent, REM 17% of total  sleep time.  Sleep latency 40 minutes, REM latency 82 minutes, awake after  sleep onset 48 minutes, arousal index increased at 70.6 per hour.  No  bedtime medication taken.   RESPIRATORY DATA:  Split study protocol.  Apnea-hypopnea index (AHI, RDI)  22.8 obstructive events per hour, indicating moderate obstructive sleep  apnea/hypopnea syndrome before CPAP.  This included 8 obstructive apneas and  38 hypopneas before CPAP.  Events were not positional.  REM AHI 32.9 per  hour. CPAP was titrated to 12 CWP, AHI 2 per hour.  A large Respironics  Comfort Light II nasal mask was used with heated humidifier and chin strap  because of oral venting.   OXYGEN DATA:  Moderate-to-loud snoring with oxygen desaturation to a nadir  of 82% before CPAP. After CPAP control, saturation held 94% to 98% on room  air.   CARDIAC DATA:  Sinus rhythm with PVCs.   MOVEMENT/PARASOMNIA:  Very frequent leg jerks with a total of 570 recorded  of which 46 were associated with arousal or awakening for periodic limb  movement with arousal index of 8.5 per hour.   IMPRESSION/RECOMMENDATION:  1.  Moderate obstructive sleep apnea/hypopnea syndrome, apnea-hypopnea index      22.8 per hour with moderate-to- loud snoring and  oxygen desaturation to      82%.  2.  Successful continuous positive airway pressure titration to 12      centimeters of water pressure, apnea-hypopnea index 2 per hour.  A large      Respironics Comfort Light II nasal mask was used with heated humidifier.      The technician also added a chin strap during the study.  3.  Periodic limb movement with arousal, 8.5 per hour.  This will be easier      to evaluate clinically once the CPAP is treated.  Specific therapy such      as Requip or clonazepam might be considered if necessary.      Clinton D. Maple Hudson, M.D.  Diplomate, Biomedical engineer of Sleep Medicine  Electronically Signed     CDY/MEDQ  D:  02/17/2005 19:04:44  T:  02/18/2005 05:09:01  Job:  [id number]

## 2010-11-30 ENCOUNTER — Ambulatory Visit (HOSPITAL_BASED_OUTPATIENT_CLINIC_OR_DEPARTMENT_OTHER)
Admission: RE | Admit: 2010-11-30 | Discharge: 2010-12-01 | Disposition: A | Discharge status: Home / Self Care | Payer: Medicare Other | Source: Ambulatory Visit | Attending: Urology | Admitting: Urology

## 2010-11-30 DIAGNOSIS — G4733 Obstructive sleep apnea (adult) (pediatric): Secondary | ICD-10-CM | POA: Insufficient documentation

## 2010-11-30 DIAGNOSIS — I10 Essential (primary) hypertension: Secondary | ICD-10-CM | POA: Insufficient documentation

## 2010-11-30 DIAGNOSIS — N529 Male erectile dysfunction, unspecified: Secondary | ICD-10-CM | POA: Insufficient documentation

## 2010-11-30 DIAGNOSIS — Z7982 Long term (current) use of aspirin: Secondary | ICD-10-CM | POA: Insufficient documentation

## 2010-11-30 DIAGNOSIS — Z79899 Other long term (current) drug therapy: Secondary | ICD-10-CM | POA: Insufficient documentation

## 2010-11-30 DIAGNOSIS — C61 Malignant neoplasm of prostate: Secondary | ICD-10-CM | POA: Insufficient documentation

## 2010-11-30 DIAGNOSIS — E7800 Pure hypercholesterolemia, unspecified: Secondary | ICD-10-CM | POA: Insufficient documentation

## 2010-11-30 LAB — POCT I-STAT, CHEM 8
BUN: 27 mg/dL — ABNORMAL HIGH (ref 6–23)
Calcium, Ion: 1.25 mmol/L (ref 1.12–1.32)
Chloride: 97 mEq/L (ref 96–112)
Creatinine, Ser: 1.6 mg/dL — ABNORMAL HIGH (ref 0.50–1.35)
Glucose, Bld: 114 mg/dL — ABNORMAL HIGH (ref 70–99)
HCT: 46 % (ref 39.0–52.0)
Hemoglobin: 15.6 g/dL (ref 13.0–17.0)
Potassium: 3.4 mEq/L — ABNORMAL LOW (ref 3.5–5.1)
Sodium: 140 mEq/L (ref 135–145)
TCO2: 31 mmol/L (ref 0–100)

## 2010-12-09 NOTE — Op Note (Signed)
NAME:  CRYSTIAN, FRITH NO.:  [id number]  MEDICAL RECORD NO.:  [id number]  LOCATION:                                 FACILITY:  PHYSICIAN:  Amreen Raczkowski C. Vernie Ammons, M.D.  DATE OF BIRTH:  Dec 12, 1945  DATE OF PROCEDURE:  11/30/2010 DATE OF DISCHARGE:                              OPERATIVE REPORT   PREOPERATIVE DIAGNOSIS:  Organic erectile dysfunction.  POSTOPERATIVE DIAGNOSIS:  Organic erectile dysfunction.  PROCEDURE:  Three-piece inflatable penile prosthesis implantation [employer]).  SURGEON:  Kelani Robart C. Vernie Ammons, MD  ASSISTANT:  Guy Sandifer.  ANESTHESIA:  General.  SPECIMENS:  None.  DRAINS:  A 16-French Foley catheter.  PROSTHESIS:  Reservoir 17 cc and rear-tip extenders 3 on each side of 3 cm, 2 cm, and 1 cm for a total of 6 cm.  BLOOD LOSS:  Minimal.  COMPLICATIONS:  None.  INDICATIONS:  The patient is a 65 year old male who underwent a radical prostatectomy and developed organic erectile dysfunction.  We have discussed the treatment options and he has elected to proceed with penile implantation, understanding the risks, complications, and alternatives.  He received Ancef 2 g and gentamicin 80 mg IV preoperatively.  DESCRIPTION OF OPERATION:  After informed consent, the patient was brought to the major OR, placed on table, administered general anesthesia.  His genitalia then was scrubbed with chlorhexidine and prepped in standard sterile fashion.  He was then draped and an official time-out was then performed.  A 16-French Foley catheter was then inserted in the bladder and the bladder was drained.  The catheter was plugged.  A scrotal incision was made in the midline following the median raphe from the base of the penis down the scrotum and combination of sharp and blunt technique was used to clear the surrounding tissue from the corpus cavernosum on each side of the corpus spongiosum.  Stay sutures were placed in the corpus cavernosum using 2-0 Vicryl  and then an incision was made in the corpora.  The corporotomy was first made on the left-hand side and lengthened with the curved Mayo scissors.  I then dilated the corpora with the Hegar dilators from 10 to 14 sequentially. This proceeded without difficulty.  I then irrigated the corpora and measured the corpora.  I noted the corpora measured 10 cm distally and 14 cm proximally.  The corpora was then irrigated again and attention was directed to the contralateral corpora, which was treated in identical fashion, dilated in identical fashion, and then measured in an identical fashion.  This side measured 10 cm distally and 14 cm proximally.  The corpora was then irrigated again and I chose an 18 cm cylinder with 6 cm total of rear-tip extenders and this was prepared.  Attention was directed to the inguinal region and through the scrotum, I was able to palpate the external inguinal ring on the right side.  I swept the cord laterally and at its most medial aspect, pierced the floor of the external inguinal ring with a clamp after completely emptying the bladder.  I then used digital dissection to develop the pocket easily.  The 75 cc reservoir was then placed through this opening in the  floor of the external inguinal ring and then I filled it with 75 cc of sterile saline.  There was no back pressure noted.  I redirected attention to the scrotum and developed a scrotal pocket in the right hemiscrotum for the pump.  I then proceeded to place the cylinders.  I placed the right cylinder first by loading the suture affixed to the distal aspect onto a Keith needle and then placing this in the [facility] inserter and passing it through the corporotomy up to the tip of the penis where the [place] needle was then advanced through the glans and the suture affixed was then clamped with a hemostat.  This was performed on the contralateral side in identical fashion.  I then placed the proximal portion of  the prosthesis with the rear-tip extenders in the corporotomy and then the distal portion first on the right side and then the left side.  I inflated the device and it appeared to inflate correctly with no kinking or aneurysmal areas along the course of the cylinders.  I, therefore, deflated the device and closed the corporotomies after irrigating again with antibiotic solution.  The corporotomies were closed with running 2-0 PDS suture.  After both corporotomies were closed, I then tested the device again by reinflating and this time I noted it did not appear to be inflating properly with some bending of the penis toward the right and it did not feel as if the cylinder was in a proper distal location on the left-hand side.  I, therefore, deflated the device and opened the left corporotomy and removed the cylinder as well as the attached rear-tip extenders.  I passed a #11 [employer] sound through the corporotomy on the left-hand side and it appeared to cross over.  I, therefore, inflated the device and with the cylinder inflated on the right-hand side, I then re-dilated the left-hand side by first passing the Metzenbaum scissors against the lateral aspect of the corpora to its most distal aspect and then dilated from 11 up to 14-French without difficulty.  Once this was performed, I noted with the cylinder inflated on the contralateral side, I could feel the Hegar dilator in a proper location at the distal aspect of the corpora on the left-hand side.  I, therefore, repassed the suture using the Keith needle and Furlow inserter and then reinserted the cylinder and then reinflated it.  In doing this, I noted a good straight erection with both cylinders equidistant beneath the glans at the end of the corpora on each side.  I therefore deflated the cylinders, closed the corporotomy on the left-hand side again and then reinflated the cylinders one last time for final test.  There was good  inflation, straight erection, and no torquing, bending, or discrepancy of the cylinders at that time.  I irrigated the scrotum copiously with antibiotic solution, dipped the pump in antibiotic solution, and agitated this and cleared it of all blood products and then inserted this in the scrotal pocket on the right- hand side.  A 3-0 chromic was then used to reapproximate the deep scrotal tissues through the tubing in order to maintain the pump in the scrotal pocket.  I then closed the scrotum in 3 layers.  The first 2 layers were running 3-0 chromic closing the deep and more superficial scrotal tissues and then I closed the scrotal skin with running 3-0 chromic.  Neosporin and Tegaderm dressing were then applied.  I then used Kerlix as a scrotal mummy wrap.  The catheter was connected to closed system drainage and the patient was awakened and taken to recovery room in stable and satisfactory condition.  He tolerated procedure well with no intraoperative complications.  He will be observed overnight and maintained on intravenous antibiotics overnight with the planned discharge in the morning.     Arian Mcquitty C. Vernie Ammons, M.D.     MCO/MEDQ  D:  11/30/2010  T:  12/01/2010  Job:  [id number]  Electronically Signed by Ihor Gully M.D. on 12/09/2010 12:58:31 PM

## 2010-12-22 HISTORY — PX: PENILE PROSTHESIS PLACEMENT: SHX739

## 2011-01-11 LAB — CBC
HCT: 42
Hemoglobin: 14.2
MCHC: 33.7
Platelets: 181
RDW: 14.3

## 2011-01-11 LAB — COMPREHENSIVE METABOLIC PANEL
Albumin: 3.8
Alkaline Phosphatase: 42
BUN: 11
Calcium: 9.3
Glucose, Bld: 131 — ABNORMAL HIGH
Potassium: 3.3 — ABNORMAL LOW
Sodium: 134 — ABNORMAL LOW
Total Protein: 6.8

## 2011-01-15 LAB — HEMOGLOBIN AND HEMATOCRIT, BLOOD
HCT: 36.7 — ABNORMAL LOW
HCT: 40.8
Hemoglobin: 12.4 — ABNORMAL LOW
Hemoglobin: 13.8

## 2011-01-15 LAB — CBC
HCT: 41.7
Hemoglobin: 13.9
MCHC: 33.4
MCV: 83.1
RBC: 5.01
RDW: 13.1

## 2011-01-15 LAB — BASIC METABOLIC PANEL
CO2: 27
Chloride: 102
GFR calc Af Amer: >60
Glucose, Bld: 119 — ABNORMAL HIGH
Sodium: 137

## 2011-01-15 LAB — POTASSIUM: Potassium: 4.1

## 2011-01-15 LAB — CREATININE, FLUID (PLEURAL, PERITONEAL, JP DRAINAGE): Creat, Fluid: 1.2

## 2011-02-04 LAB — CBC
HCT: 43.8
MCV: 81.8
Platelets: 149 — ABNORMAL LOW
RDW: 14.2 — ABNORMAL HIGH

## 2011-02-04 LAB — BASIC METABOLIC PANEL
BUN: 8
BUN: 11
CO2: 30
Calcium: 9.1
Creatinine, Ser: 1.15
GFR calc non Af Amer: >60
Glucose, Bld: 129 — ABNORMAL HIGH
Glucose, Bld: 107 — ABNORMAL HIGH
Potassium: 3.6
Sodium: 136

## 2011-02-04 LAB — APTT: aPTT: 31

## 2011-02-04 LAB — PROTIME-INR: Prothrombin Time: 13.6

## 2011-02-06 LAB — URINE MICROSCOPIC-ADD ON

## 2011-02-06 LAB — URINALYSIS, ROUTINE W REFLEX MICROSCOPIC
Bilirubin Urine: NEGATIVE
Glucose, UA: NEGATIVE
Ketones, ur: NEGATIVE
pH: 6

## 2011-02-06 LAB — URINE CULTURE: Colony Count: 15000

## 2011-03-12 ENCOUNTER — Other Ambulatory Visit: Payer: Self-pay | Admitting: Urology

## 2011-04-04 ENCOUNTER — Encounter (HOSPITAL_BASED_OUTPATIENT_CLINIC_OR_DEPARTMENT_OTHER): Payer: Self-pay | Admitting: *Deleted
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
## 2011-04-04 NOTE — Progress Notes (Signed)
To [facility] at 0815 ,Istat on arrival,  EKG,cxr with chart.Hibiclens shower evening prior and am of procedure.npo after mn.

## 2011-04-05 ENCOUNTER — Ambulatory Visit (HOSPITAL_BASED_OUTPATIENT_CLINIC_OR_DEPARTMENT_OTHER)
Admission: RE | Admit: 2011-04-05 | Discharge: 2011-04-05 | Disposition: A | Discharge status: Home / Self Care | Payer: Medicare Other | Source: Ambulatory Visit | Attending: Urology | Admitting: Urology

## 2011-04-05 ENCOUNTER — Ambulatory Visit (HOSPITAL_BASED_OUTPATIENT_CLINIC_OR_DEPARTMENT_OTHER): Payer: Medicare Other | Admitting: Anesthesiology

## 2011-04-05 ENCOUNTER — Encounter (HOSPITAL_BASED_OUTPATIENT_CLINIC_OR_DEPARTMENT_OTHER)
Admission: RE | Disposition: A | Discharge status: Home / Self Care | Payer: Self-pay | Source: Ambulatory Visit | Attending: Urology

## 2011-04-05 ENCOUNTER — Encounter (HOSPITAL_BASED_OUTPATIENT_CLINIC_OR_DEPARTMENT_OTHER): Payer: Self-pay | Admitting: Anesthesiology

## 2011-04-05 ENCOUNTER — Encounter (HOSPITAL_BASED_OUTPATIENT_CLINIC_OR_DEPARTMENT_OTHER): Payer: Self-pay | Admitting: *Deleted

## 2011-04-05 DIAGNOSIS — Y831 Surgical operation with implant of artificial internal device as the cause of abnormal reaction of the patient, or of later complication, without mention of misadventure at the time of the procedure: Secondary | ICD-10-CM | POA: Insufficient documentation

## 2011-04-05 DIAGNOSIS — I10 Essential (primary) hypertension: Secondary | ICD-10-CM | POA: Insufficient documentation

## 2011-04-05 DIAGNOSIS — T83490A Other mechanical complication of penile (implanted) prosthesis, initial encounter: Secondary | ICD-10-CM

## 2011-04-05 DIAGNOSIS — C61 Malignant neoplasm of prostate: Secondary | ICD-10-CM | POA: Insufficient documentation

## 2011-04-05 DIAGNOSIS — E7800 Pure hypercholesterolemia, unspecified: Secondary | ICD-10-CM | POA: Insufficient documentation

## 2011-04-05 DIAGNOSIS — Z79899 Other long term (current) drug therapy: Secondary | ICD-10-CM | POA: Insufficient documentation

## 2011-04-05 DIAGNOSIS — T8389XA Other specified complication of genitourinary prosthetic devices, implants and grafts, initial encounter: Secondary | ICD-10-CM | POA: Insufficient documentation

## 2011-04-05 DIAGNOSIS — F411 Generalized anxiety disorder: Secondary | ICD-10-CM | POA: Insufficient documentation

## 2011-04-05 DIAGNOSIS — N529 Male erectile dysfunction, unspecified: Secondary | ICD-10-CM | POA: Insufficient documentation

## 2011-04-05 HISTORY — DX: Essential (primary) hypertension: I10

## 2011-04-05 HISTORY — DX: Unspecified osteoarthritis, unspecified site: M19.90

## 2011-04-05 HISTORY — DX: Hyperlipidemia, unspecified: E78.5

## 2011-04-05 HISTORY — DX: Post-traumatic stress disorder, unspecified: F43.10

## 2011-04-05 HISTORY — PX: PENILE PROSTHESIS IMPLANT: SHX240

## 2011-04-05 HISTORY — DX: Malignant (primary) neoplasm, unspecified: C80.1

## 2011-04-05 HISTORY — DX: Unspecified hearing loss, unspecified ear: H91.90

## 2011-04-05 LAB — POCT I-STAT 4, (NA,K, GLUC, HGB,HCT)
Glucose, Bld: 115 mg/dL — ABNORMAL HIGH (ref 70–99)
HCT: 39 % (ref 39.0–52.0)
Hemoglobin: 13.3 g/dL (ref 13.0–17.0)
Potassium: 3.4 mEq/L — ABNORMAL LOW (ref 3.5–5.1)
Sodium: 143 mEq/L (ref 135–145)

## 2011-04-05 SURGERY — INSERTION, PENILE PROSTHESIS, INFLATABLE
Anesthesia: General | Site: Scrotum | Wound class: Clean

## 2011-04-05 MED ORDER — SODIUM CHLORIDE 0.9 % IR SOLN
Status: DC | PRN
  Administered 2011-04-05: 11:00:00

## 2011-04-05 MED ORDER — MEPERIDINE HCL 25 MG/ML IJ SOLN: 6.2500 mg | INTRAMUSCULAR | Status: DC | PRN

## 2011-04-05 MED ORDER — FENTANYL CITRATE 0.05 MG/ML IJ SOLN
INTRAMUSCULAR | Status: DC | PRN
  Administered 2011-04-05: 50 ug via INTRAVENOUS
  Administered 2011-04-05 (×2): 25 ug via INTRAVENOUS
  Administered 2011-04-05: 50 ug via INTRAVENOUS

## 2011-04-05 MED ORDER — HYDROCODONE-ACETAMINOPHEN 7.5-325 MG PO TABS: 1.0000 | ORAL_TABLET | ORAL | Status: AC | PRN

## 2011-04-05 MED ORDER — LACTATED RINGERS IV SOLN
INTRAVENOUS | Status: DC
  Administered 2011-04-05 (×3): via INTRAVENOUS

## 2011-04-05 MED ORDER — 0.9 % SODIUM CHLORIDE (POUR BTL) OPTIME
TOPICAL | Status: DC | PRN
  Administered 2011-04-05: 500 mL

## 2011-04-05 MED ORDER — LIDOCAINE HCL (CARDIAC) 20 MG/ML IV SOLN
INTRAVENOUS | Status: DC | PRN
  Administered 2011-04-05: 50 mg via INTRAVENOUS

## 2011-04-05 MED ORDER — LACTATED RINGERS IV SOLN: INTRAVENOUS | Status: DC

## 2011-04-05 MED ORDER — SUCCINYLCHOLINE CHLORIDE 20 MG/ML IJ SOLN
INTRAMUSCULAR | Status: DC | PRN
  Administered 2011-04-05: 180 mg via INTRAVENOUS

## 2011-04-05 MED ORDER — SULFAMETHOXAZOLE-TMP DS 800-160 MG PO TABS: 1.0000 | ORAL_TABLET | Freq: Two times a day (BID) | ORAL | Status: AC

## 2011-04-05 MED ORDER — DEXAMETHASONE SODIUM PHOSPHATE 4 MG/ML IJ SOLN
INTRAMUSCULAR | Status: DC | PRN
  Administered 2011-04-05: 4 mg via INTRAVENOUS

## 2011-04-05 MED ORDER — ONDANSETRON HCL 4 MG/2ML IJ SOLN
INTRAMUSCULAR | Status: DC | PRN
  Administered 2011-04-05: 4 mg via INTRAVENOUS

## 2011-04-05 MED ORDER — PROPOFOL 10 MG/ML IV EMUL
INTRAVENOUS | Status: DC | PRN
  Administered 2011-04-05: 75 mg via INTRAVENOUS
  Administered 2011-04-05: 200 mg via INTRAVENOUS
  Administered 2011-04-05: 100 mg via INTRAVENOUS
  Administered 2011-04-05: 50 mg via INTRAVENOUS

## 2011-04-05 MED ORDER — GENTAMICIN IN SALINE 1.6-0.9 MG/ML-% IV SOLN
80.0000 mg | INTRAVENOUS | Status: AC
  Administered 2011-04-05: 80 mg via INTRAVENOUS

## 2011-04-05 MED ORDER — PROMETHAZINE HCL 25 MG/ML IJ SOLN: 6.2500 mg | INTRAMUSCULAR | Status: DC | PRN

## 2011-04-05 MED ORDER — STERILE WATER FOR IRRIGATION IR SOLN
Status: DC | PRN
  Administered 2011-04-05: 500 mL

## 2011-04-05 MED ORDER — FENTANYL CITRATE 0.05 MG/ML IJ SOLN: 25.0000 ug | INTRAMUSCULAR | Status: DC | PRN

## 2011-04-05 MED ORDER — CEFAZOLIN SODIUM 1-5 GM-% IV SOLN
1.0000 g | INTRAVENOUS | Status: AC
  Administered 2011-04-05: 2 g via INTRAVENOUS

## 2011-04-05 SURGICAL SUPPLY — 63 items
ADH SKN CLS APL DERMABOND .7 (GAUZE/BANDAGES/DRESSINGS) ×1
APL SKNCLS STERI-STRIP NONHPOA (GAUZE/BANDAGES/DRESSINGS)
APPLICATOR COTTON TIP 6IN STRL (MISCELLANEOUS) IMPLANT
BAG URINE DRAINAGE (UROLOGICAL SUPPLIES) IMPLANT
BANDAGE CO FLEX L/F 2IN X 5YD (GAUZE/BANDAGES/DRESSINGS) IMPLANT
BANDAGE GAUZE ELAST BULKY 4 IN (GAUZE/BANDAGES/DRESSINGS) ×2 IMPLANT
BENZOIN TINCTURE PRP APPL 2/3 (GAUZE/BANDAGES/DRESSINGS) IMPLANT
BLADE HEX COATED 2.75 (ELECTRODE) ×2 IMPLANT
BLADE SURG 10 STRL SS (BLADE) IMPLANT
BLADE SURG 15 STRL LF DISP TIS (BLADE) ×2 IMPLANT
BLADE SURG 15 STRL SS (BLADE) ×4
BLADE SURG ROTATE 9660 (MISCELLANEOUS) IMPLANT
CANISTER SUCTION 1200CC (MISCELLANEOUS) IMPLANT
CANISTER SUCTION 2500CC (MISCELLANEOUS) IMPLANT
CATH FOLEY 2WAY SLVR  5CC 16FR (CATHETERS) ×1
CATH FOLEY 2WAY SLVR 5CC 16FR (CATHETERS) ×1 IMPLANT
CHLORAPREP W/TINT 26ML (MISCELLANEOUS) ×2 IMPLANT
CLOTH BEACON ORANGE TIMEOUT ST (SAFETY) ×2 IMPLANT
COVER MAYO STAND STRL (DRAPES) ×4 IMPLANT
COVER TABLE BACK 60X90 (DRAPES) ×2 IMPLANT
DERMABOND ADVANCED (GAUZE/BANDAGES/DRESSINGS) ×1
DERMABOND ADVANCED .7 DNX12 (GAUZE/BANDAGES/DRESSINGS) ×1 IMPLANT
DISSECTOR ROUND CHERRY 3/8 STR (MISCELLANEOUS) IMPLANT
DRAPE EXTREMITY T 121X128X90 (DRAPE) ×2 IMPLANT
DRAPE LAPAROTOMY TRNSV 102X78 (DRAPE) IMPLANT
DRSG TEGADERM 4X4.75 (GAUZE/BANDAGES/DRESSINGS) ×2 IMPLANT
ELECT REM PT RETURN 9FT ADLT (ELECTROSURGICAL) ×2
ELECTRODE REM PT RTRN 9FT ADLT (ELECTROSURGICAL) ×1 IMPLANT
GLOVE BIO SURGEON STRL SZ8 (GLOVE) ×3 IMPLANT
GLOVE INDICATOR 6.5 STRL GRN (GLOVE) ×2 IMPLANT
GOWN PREVENTION PLUS LG XLONG (DISPOSABLE) ×2 IMPLANT
GOWN STRL REIN XL XLG (GOWN DISPOSABLE) ×2 IMPLANT
IV NS IRRIG 3000ML ARTHROMATIC (IV SOLUTION) IMPLANT
NS IRRIG 500ML POUR BTL (IV SOLUTION) ×2 IMPLANT
PACK BASIN DAY SURGERY FS (CUSTOM PROCEDURE TRAY) ×2 IMPLANT
PENCIL BUTTON HOLSTER BLD 10FT (ELECTRODE) ×2 IMPLANT
PLUG CATH AND CAP STER (CATHETERS) ×2 IMPLANT
RETRACTOR WILSON SYSTEM (INSTRUMENTS) ×2 IMPLANT
SPONGE LAP 18X18 X RAY DECT (DISPOSABLE) IMPLANT
SPONGE LAP 4X18 X RAY DECT (DISPOSABLE) ×5 IMPLANT
STRIP CLOSURE SKIN 1/2X4 (GAUZE/BANDAGES/DRESSINGS) IMPLANT
SUPPORT SCROTAL LG STRP (MISCELLANEOUS) ×2 IMPLANT
SUPPORT SCROTAL LRG NO STRP (SOFTGOODS) ×1 IMPLANT
SURGILUBE 2OZ TUBE FLIPTOP (MISCELLANEOUS) ×2 IMPLANT
SUT CHROMIC 3 0 SH 27 (SUTURE) ×5 IMPLANT
SUT PDS AB 2-0 CT2 27 (SUTURE) ×4 IMPLANT
SUT VIC AB 2-0 UR6 27 (SUTURE) ×16 IMPLANT
SUT VIC AB 3-0 SH 27 (SUTURE)
SUT VIC AB 3-0 SH 27X BRD (SUTURE) IMPLANT
SUT VICRYL 0 UR6 27IN ABS (SUTURE) IMPLANT
SUT VICRYL 4-0 PS2 18IN ABS (SUTURE) IMPLANT
SYR 20CC LL (SYRINGE) ×2 IMPLANT
SYR 50ML LL SCALE MARK (SYRINGE) ×4 IMPLANT
SYR BULB IRRIGATION 50ML (SYRINGE) ×2 IMPLANT
SYRINGE 10CC LL (SYRINGE) ×2 IMPLANT
TITAN ×1 IMPLANT
TOWEL OR 17X24 6PK STRL BLUE (TOWEL DISPOSABLE) ×5 IMPLANT
TRAY DSU PREP LF (CUSTOM PROCEDURE TRAY) ×2 IMPLANT
TUBE CONNECTING 12X1/4 (SUCTIONS) ×2 IMPLANT
WATER STERILE IRR 3000ML UROMA (IV SOLUTION) IMPLANT
WATER STERILE IRR 500ML POUR (IV SOLUTION) ×2 IMPLANT
YANKAUER SUCT BULB TIP NO VENT (SUCTIONS) ×2 IMPLANT
titan ×1 IMPLANT

## 2011-04-05 NOTE — Op Note (Signed)
PATIENT:  Mathew Malone  PRE-OPERATIVE DIAGNOSIS:  Malfunctioning 3 piece inflatable penile prosthesis  POST-OPERATIVE DIAGNOSIS:  Same  PROCEDURE:  Procedure(s): 1. Scrotal exploration. 2. Replacement of penile prosthesis pump.  SURGEON:  Garnett Farm  INDICATION: He underwent IPP placement on 11/30/10. The device functioned however inflation seemed excessively difficult and it was felt revision was indicated.  ANESTHESIA:  General  EBL:  Minimal  DRAINS: None  LOCAL MEDICATIONS USED:  None  SPECIMEN: Prosthesis pump  DISPOSITION OF SPECIMEN:  Sent back to the company for analysis of malfunctioning  Description of procedure: After informed consent the patient was brought to the major or, placed on the table and administered general anesthesia. His genitalia was then sterilely scrubbed for 10 minutes and prepped followed by sterile draping. An official timeout was then performed.  A 16 French Foley catheter was then placed in the bladder the bladder was drained and the catheter was plugged. I first attempted to deflate the device and noted some resistance to deflation. I was able to get it inflated and then pumped the device. Pumped up with some resistance but completely filled and the cylinders were noted to be in good position. Equidistant in the glans and the erection was straight without any SST deformity or other abnormality. I then deflated the device again. A median raphae scrotal incision was then made and carried down to the pump which was exposed. There was a small amount of clear fluid surrounding the pump but no evidence of infection. Once the pump was freed I then re\re inflated and then deflated the device. There was some slight resistance but it seemed to inflate easily and then deflated with some slight resistance. I felt the resistance of greater than it should be. It was felt replacement of the pump was indicated.  I dissected down the tubing was attached to the pump  to the level of the corpora on each side. I did not make any corporal incisions. I then placed a shod hemostat on the tubing exiting each of the corpora and on the tubing connected to the reservoir. I then cut each of these tubes approximately 2 cm from the shod hemostat. A new pump was prepped and then placed in the location it was to lay within the scrotum. Shod hemostat applied to each of the tubes and the excess tubing was excised. I then attached the to clear tubes to the to clear tubes of the cylinders with the connection supplied and then connected the black tubing from the reservoir. I removed all of the shod hemostat and cycled the device. It inflated without significant difficulty although there was a very mild amount of resistance this was felt to be a normal resistance of the system and not abnormal in any way. I then deflated the device and allowed it to the first to deflate spontaneously and felt that about 70% of the fluid drained from the cylinders spontaneously without any pressure. I then applied pressure to the cylinders and was able to drain approximately 90% of the fluid easily indicating there was no capsule formation around the reservoir.  I developed the scrotal pocket somewhat more superficial in order to make the pump easier to use. The scrotum was then copiously irrigated with antibiotic solution. I then closed the scrotum in 3 layers. The first layer was reapproximated in the deep scrotum with running 3-0 chromic. The superficial scrotal tissue was then reapproximated again with a running 3-0 chromic and the skin was reapproximated  with running 3-0 chromic as well. I then applied antibiotic ointment, a sterile 4 x 4, and Tegaderm and then flushed Kerlix and secured this with a Kerlix wrap. The patient was then awakened after his catheter was removed and taken to the recovery room in stable and satisfactory condition. He tolerated the procedure well with no intraoperative complications.  Needle sponge and instrument counts were reportedly correct x2 at the end of the operation.  PLAN OF CARE: Discharge to home after PACU  PATIENT DISPOSITION:  PACU - hemodynamically stable.

## 2011-04-05 NOTE — Anesthesia Procedure Notes (Addendum)
Procedure Name: Intubation Date/Time: 04/05/2011 10:15 AM Performed by: Lorrin Jackson Pre-anesthesia Checklist: Patient identified, Emergency Drugs available, Suction available and Patient being monitored Patient Re-evaluated:Patient Re-evaluated prior to inductionOxygen Delivery Method: Circle System Utilized Preoxygenation: Pre-oxygenation with 100% oxygen Intubation Type: IV induction Ventilation: Mask ventilation without difficulty Laryngoscope Size: Mac and 3 Grade View: Grade I Tube type: Oral Number of attempts: 1 Airway Equipment and Method: stylet and oral airway Placement Confirmation: ETT inserted through vocal cords under direct vision,  positive ETCO2 and breath sounds checked- equal and bilateral Tube secured with: Tape Dental Injury: Teeth and Oropharynx as per pre-operative assessment  Comments: First Attempt with #4 LMA Supreme, 2nd Attempt LMA #5 without success.Continous Mask ventilation. Intubated by Dr. Delane Ginger

## 2011-04-05 NOTE — Anesthesia Postprocedure Evaluation (Signed)
  Anesthesia Post-op Note  Patient: Mathew Malone  Procedure(s) Performed:  PENILE PROTHESIS INFLATABLE -  EXPLORATION AND REVISION OF INFLATABLE PENILE   PROSTHESIS    PUMP  Patient Location: PACU  Anesthesia Type: General  Level of Consciousness: awake and alert   Airway and Oxygen Therapy: Patient Spontanous Breathing  Post-op Pain: mild  Post-op Assessment: Post-op Vital signs reviewed, Patient's Cardiovascular Status Stable, Respiratory Function Stable, Patent Airway and No signs of Nausea or vomiting  Post-op Vital Signs: stable  Complications: No apparent anesthesia complications

## 2011-04-05 NOTE — Transfer of Care (Signed)
Immediate Anesthesia Transfer of Care Note  Patient: Mathew Malone  Procedure(s) Performed:  PENILE PROTHESIS INFLATABLE -  EXPLORATION AND REVISION OF INFLATABLE PENILE   PROSTHESIS    PUMP  Patient Location: Patient transported to PACU with oxygen via face mask at 6 Liters / Min  Anesthesia Type: General  Level of Consciousness: awake and alert   Airway & Oxygen Therapy: Patient Spontanous Breathing and Patient connected to face mask oxygen Post-op Assessment: Report given to PACU RN and Post -op Vital signs reviewed and stable  Post vital signs: Reviewed and stable  Complications: No apparent anesthesia complications

## 2011-04-05 NOTE — Anesthesia Preprocedure Evaluation (Addendum)
Anesthesia Evaluation  Patient identified by MRN, date of birth, ID band Patient awake    Reviewed: Allergy & Precautions, H&P , NPO status , Patient's Chart, lab work & pertinent test results  Airway Mallampati: II TM Distance: >3 FB Neck ROM: full    Dental No notable dental hx.    Pulmonary neg pulmonary ROS, sleep apnea and Continuous Positive Airway Pressure Ventilation ,  clear to auscultation  Pulmonary exam normal       Cardiovascular Exercise Tolerance: Good hypertension, Pt. on medications neg cardio ROS regular Normal    Neuro/Psych Negative Neurological ROS  Negative Psych ROS   GI/Hepatic negative GI ROS, Neg liver ROS,   Endo/Other  Negative Endocrine ROS  Renal/GU negative Renal ROS  Genitourinary negative   Musculoskeletal   Abdominal   Peds  Hematology negative hematology ROS (+)   Anesthesia Other Findings   Reproductive/Obstetrics negative OB ROS                          Anesthesia Physical Anesthesia Plan  ASA: II  Anesthesia Plan: General   Post-op Pain Management:    Induction:   Airway Management Planned:   Additional Equipment:   Intra-op Plan:   Post-operative Plan:   Informed Consent: I have reviewed the patients History and Physical, chart, labs and discussed the procedure including the risks, benefits and alternatives for the proposed anesthesia with the patient or authorized representative who has indicated his/her understanding and acceptance.   Dental Advisory Given  Plan Discussed with: CRNA  Anesthesia Plan Comments:         Anesthesia Quick Evaluation

## 2011-04-05 NOTE — Progress Notes (Signed)
Spoke with patient's wife by phone to have her return to hospital for discharge instructions.  She will be unable to come to hospital until 2:15.

## 2011-04-05 NOTE — Progress Notes (Signed)
Reported to M. Shuntavia Yerby, RN as caregiver 

## 2011-04-10 ENCOUNTER — Encounter (HOSPITAL_BASED_OUTPATIENT_CLINIC_OR_DEPARTMENT_OTHER): Payer: Self-pay | Admitting: Urology

## 2011-05-30 ENCOUNTER — Encounter (HOSPITAL_COMMUNITY): Payer: Self-pay | Admitting: *Deleted

## 2011-05-30 ENCOUNTER — Emergency Department (HOSPITAL_COMMUNITY)
Admission: EM | Admit: 2011-05-30 | Discharge: 2011-05-30 | Disposition: A | Discharge status: Home / Self Care | Payer: No Typology Code available for payment source | Attending: Emergency Medicine | Admitting: Emergency Medicine

## 2011-05-30 ENCOUNTER — Emergency Department (HOSPITAL_COMMUNITY): Payer: No Typology Code available for payment source

## 2011-05-30 DIAGNOSIS — T148XXA Other injury of unspecified body region, initial encounter: Secondary | ICD-10-CM | POA: Insufficient documentation

## 2011-05-30 DIAGNOSIS — Z9889 Other specified postprocedural states: Secondary | ICD-10-CM | POA: Insufficient documentation

## 2011-05-30 DIAGNOSIS — M79609 Pain in unspecified limb: Secondary | ICD-10-CM | POA: Insufficient documentation

## 2011-05-30 DIAGNOSIS — I10 Essential (primary) hypertension: Secondary | ICD-10-CM | POA: Insufficient documentation

## 2011-05-30 DIAGNOSIS — S6390XA Sprain of unspecified part of unspecified wrist and hand, initial encounter: Secondary | ICD-10-CM | POA: Insufficient documentation

## 2011-05-30 DIAGNOSIS — E785 Hyperlipidemia, unspecified: Secondary | ICD-10-CM | POA: Insufficient documentation

## 2011-05-30 DIAGNOSIS — M129 Arthropathy, unspecified: Secondary | ICD-10-CM | POA: Insufficient documentation

## 2011-05-30 DIAGNOSIS — Z8546 Personal history of malignant neoplasm of prostate: Secondary | ICD-10-CM | POA: Insufficient documentation

## 2011-05-30 DIAGNOSIS — S63609A Unspecified sprain of unspecified thumb, initial encounter: Secondary | ICD-10-CM

## 2011-05-30 DIAGNOSIS — Z79899 Other long term (current) drug therapy: Secondary | ICD-10-CM | POA: Insufficient documentation

## 2011-05-30 DIAGNOSIS — M25569 Pain in unspecified knee: Secondary | ICD-10-CM | POA: Insufficient documentation

## 2011-05-30 MED ORDER — HYDROCODONE-ACETAMINOPHEN 5-325 MG PO TABS
1.0000 | ORAL_TABLET | Freq: Once | ORAL | Status: AC
  Administered 2011-05-30: 1 via ORAL
  Filled 2011-05-30: qty 1
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
## 2011-05-30 NOTE — Progress Notes (Signed)
Orthopedic Tech Progress Note Patient Details:  Mathew Malone [birth year] [id number]  Type of Splint: Thumb velcro Splint Location: right hand Splint Interventions: Application    [name] 05/30/2011, 10:36 PM
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
## 2011-06-03 NOTE — ED Provider Notes (Signed)
Medical screening examination/treatment/procedure(s) were performed by non-physician practitioner and as supervising physician I was immediately available for consultation/collaboration  Gerhard Munch, MD 06/03/11 1046

## 2011-10-16 ENCOUNTER — Other Ambulatory Visit: Payer: Self-pay | Admitting: Urology

## 2011-10-17 ENCOUNTER — Encounter (HOSPITAL_BASED_OUTPATIENT_CLINIC_OR_DEPARTMENT_OTHER): Payer: Self-pay | Admitting: *Deleted

## 2011-10-17 NOTE — Progress Notes (Addendum)
To [facility] at 0915-Istat on arrival,Ekg,Cxr in epic-Npo after Mn-to take shower with hibiclens the evening prior and morning of procedure,also bring CPAP and mask-guidelines for RCC reviewed.

## 2011-10-18 NOTE — H&P (Signed)
History of Present Illness            Mathew Malone is a 66 year old with the following urologic history:  1) Prostate cancer: He was initially diangosed in 2004 and delayed treatment. He is s/p a NNS RAL radical prostatectomy on August 12, 2007. He did undergo a preoperative staging evaluation including a bone scan and CT scan which were negative. He has received adjuvant radiation therapy (68.4 Gy) completed in September 2009. His PSA has been undetectable since treatment.  TNM stage: pT3a N0 Mx Gleason score: 4+3=7 Surgical margins: Positive (bilateral) Pretreatment PSA: 11.42  2) Erectile dysfunction: He had erectile dysfunction even prior to his prostatecotmy. In 8/12 he underwent a 3 piece inflatable penile prosthesis implantation. When he returned for followup to begin inflating the device I noted the device was cycling properly. He returned having noted he was having significant difficulty inflating the device himself and it did seem there may have been some resistance so the device was revised surgically in 12/12 with the placement of a new pump. He's had bilateral shoulder surgery and has lost strength in his hands.  Interval history: He remains unable to pump his prosthesis. He continues to note at night and in the morning that he describes having an erection despite having a prosthesis in the after he urinates his erection resolves.    Past Medical History  Problems  1. History of  Anxiety (Symptom) 300.00 2. History of  Hypercholesterolemia 272.0 3. History of  Hypertension 401.9 4. History of  Post-traumatic Stress Disorder 309.81  Surgical History Problems  1. History of  Prostatect Retropubic Radical W/ Nerve Sparing Laparoscopic 2. History of  Rotator Cuff Repair Bilateral 3. History of  Shoulder Surgery Right 4. History of  Surg Penis Insertion Of Penile Prosthesis 5. History of  Surg Penis Replacement Of Inflatable Penile Prosthesis 6. History of  Surgery Scrotum  Exploration  Current Meds 1. Aspirin Low Dose 81 MG Oral Tablet; Therapy: (Recorded:27Jun2008) to 2. Hydrochlorothiazide CAPS; Therapy: (Recorded:27Jun2008) to 3. Hydrocodone-Acetaminophen 5-500 MG Oral Tablet; TAKE 1-2 EVERY 4 HOURS PRN; Therapy:  28Apr2009 to (Last Rx:20Aug2012) 4. Myrbetriq 25 MG Oral Tablet Extended Release 24 Hour; Take 1 tablet daily; Therapy: 21Dec2012  to (Evaluate:28Dec2013)  Requested for: 02Jan2013; Last Rx:02Jan2013 5. Trimix (PGE 40, PAP 30, PHE 0.5); Prostaglandin , PAP-30 mg, PHE 1 mg.  Use as  directed. Please supply needles and syringes for the patient; Therapy: 14Nov2011 to (Last  Rx:14Nov2011) 6. Zoloft TABS; Therapy: (Recorded:03Mar2009) to  Allergies Medication  1. No Known Drug Allergies  Family History Problems  1. Family history of  Congestive Heart Failure 2. Family history of  Family Health Status Number Of Children 1 son 2 daughters  Social History Problems  1. Alcohol Use occ wine 2. Marital History - Currently Married 3. Occupation: post office 4. Tobacco Use V15.82 smoked 1ppd x 10 yrs quit 25yrs ago  Review of Systems Genitourinary, constitutional, skin, eye, otolaryngeal, hematologic/lymphatic, cardiovascular, pulmonary, endocrine, musculoskeletal, gastrointestinal, neurological and psychiatric system(s) were reviewed and pertinent findings if present are noted.  Genitourinary: penile pain and scrotal swelling.    Vitals Vital Signs  Blood Pressure: 134 / 81 Temperature: 98.9 F Heart Rate: 69  BMI Calculated: 36.65 BSA Calculated: 2.32 Height: 5 ft 10 in Weight: 256 lb   Physical Exam Constitutional: Well nourished and well developed . No acute distress.  ENT:. The ears and nose are normal in appearance.  Neck: The appearance of the neck is normal  and no neck mass is present.  Pulmonary: No respiratory distress and normal respiratory rhythm and effort.  Cardiovascular:. No peripheral edema.  Skin: Normal skin  turgor, no visible rash and no visible skin lesions.  Neuro/Psych:. Mood and affect are appropriate.  GU: His prosthesis is in good position within the corpora. There is no tenderness or sign of infection. The scrotum is also free of any sign of infection. His pump is in the right hemiscrotum. It cannot be depressed despite a great deal of effort.  Assessment Assessed  1. Mechanical Complication Of Genitourinary Device 996.30       I examined him and noted his pump is in excellent position. There is no way I can pump the device however and it is clearly not functioning properly despite the fact that I had replaced the pump. The issue is not from the reservoir back to the pump but has to be between the pump and the cylinders. This would tend to make me think that there is some kinking or scar tissue that has enveloped the tubing from the pump to the cylinders. I told him that correction of this would require another surgery and that what I would do is have to cut down to the corpora and possibly even replace the cylinders but at the very least make sure there is no kinking or compression of the tubing. We discussed the procedure in detail and I have gone over the risks and complications. Answered all his questions as well. In the meantime I gave to give him some samples of Levitra and see if this works to his satisfaction.   Plan    1. Samples of Levitra 20 mg were given today. 2. He is scheduled him for exploration and replacement of his prosthesis.

## 2011-10-21 ENCOUNTER — Encounter (HOSPITAL_BASED_OUTPATIENT_CLINIC_OR_DEPARTMENT_OTHER)
Admission: RE | Disposition: A | Discharge status: Home / Self Care | Payer: Self-pay | Source: Ambulatory Visit | Attending: Urology

## 2011-10-21 ENCOUNTER — Encounter (HOSPITAL_BASED_OUTPATIENT_CLINIC_OR_DEPARTMENT_OTHER): Payer: Self-pay | Admitting: Anesthesiology

## 2011-10-21 ENCOUNTER — Ambulatory Visit (HOSPITAL_BASED_OUTPATIENT_CLINIC_OR_DEPARTMENT_OTHER): Payer: Medicare Other | Admitting: Anesthesiology

## 2011-10-21 ENCOUNTER — Ambulatory Visit (HOSPITAL_BASED_OUTPATIENT_CLINIC_OR_DEPARTMENT_OTHER)
Admission: RE | Admit: 2011-10-21 | Discharge: 2011-10-21 | Disposition: A | Discharge status: Home / Self Care | Payer: Medicare Other | Source: Ambulatory Visit | Attending: Urology | Admitting: Urology

## 2011-10-21 ENCOUNTER — Encounter (HOSPITAL_BASED_OUTPATIENT_CLINIC_OR_DEPARTMENT_OTHER): Payer: Self-pay | Admitting: *Deleted

## 2011-10-21 DIAGNOSIS — C61 Malignant neoplasm of prostate: Secondary | ICD-10-CM | POA: Insufficient documentation

## 2011-10-21 DIAGNOSIS — E7800 Pure hypercholesterolemia, unspecified: Secondary | ICD-10-CM | POA: Insufficient documentation

## 2011-10-21 DIAGNOSIS — N529 Male erectile dysfunction, unspecified: Secondary | ICD-10-CM | POA: Insufficient documentation

## 2011-10-21 DIAGNOSIS — Y831 Surgical operation with implant of artificial internal device as the cause of abnormal reaction of the patient, or of later complication, without mention of misadventure at the time of the procedure: Secondary | ICD-10-CM | POA: Insufficient documentation

## 2011-10-21 DIAGNOSIS — T8389XA Other specified complication of genitourinary prosthetic devices, implants and grafts, initial encounter: Secondary | ICD-10-CM | POA: Insufficient documentation

## 2011-10-21 DIAGNOSIS — T83490A Other mechanical complication of penile (implanted) prosthesis, initial encounter: Secondary | ICD-10-CM

## 2011-10-21 DIAGNOSIS — I10 Essential (primary) hypertension: Secondary | ICD-10-CM | POA: Insufficient documentation

## 2011-10-21 DIAGNOSIS — Z79899 Other long term (current) drug therapy: Secondary | ICD-10-CM | POA: Insufficient documentation

## 2011-10-21 HISTORY — PX: PENILE PROSTHESIS IMPLANT: SHX240

## 2011-10-21 LAB — POCT I-STAT 4, (NA,K, GLUC, HGB,HCT)
Glucose, Bld: 109 mg/dL — ABNORMAL HIGH (ref 70–99)
HCT: 44 % (ref 39.0–52.0)
Hemoglobin: 15 g/dL (ref 13.0–17.0)
Potassium: 3.1 mEq/L — ABNORMAL LOW (ref 3.5–5.1)
Sodium: 141 mEq/L (ref 135–145)

## 2011-10-21 SURGERY — INSERTION, PENILE PROSTHESIS, INFLATABLE
Anesthesia: General | Site: Penis | Wound class: Clean

## 2011-10-21 MED ORDER — PROPOFOL 10 MG/ML IV EMUL
INTRAVENOUS | Status: DC | PRN
  Administered 2011-10-21: 300 mg via INTRAVENOUS
  Administered 2011-10-21 (×2): 100 mg via INTRAVENOUS

## 2011-10-21 MED ORDER — SODIUM CHLORIDE 0.9 % IR SOLN
Status: DC | PRN
  Administered 2011-10-21: 11:00:00

## 2011-10-21 MED ORDER — GENTAMICIN SULFATE 40 MG/ML IJ SOLN
120.0000 mg | INTRAVENOUS | Status: AC
  Administered 2011-10-21: 120 mg via INTRAVENOUS

## 2011-10-21 MED ORDER — ONDANSETRON HCL 4 MG/2ML IJ SOLN
INTRAMUSCULAR | Status: DC | PRN
  Administered 2011-10-21: 4 mg via INTRAVENOUS

## 2011-10-21 MED ORDER — OXYCODONE-ACETAMINOPHEN 10-325 MG PO TABS: 1.0000 | ORAL_TABLET | ORAL | Status: AC | PRN

## 2011-10-21 MED ORDER — SODIUM CHLORIDE 0.9 % IJ SOLN
INTRAMUSCULAR | Status: DC | PRN
  Administered 2011-10-21: 70 mL

## 2011-10-21 MED ORDER — CEFAZOLIN SODIUM-DEXTROSE 2-3 GM-% IV SOLR
2.0000 g | INTRAVENOUS | Status: AC
  Administered 2011-10-21: 2 g via INTRAVENOUS

## 2011-10-21 MED ORDER — CIPROFLOXACIN HCL 500 MG PO TABS: 500.0000 mg | ORAL_TABLET | Freq: Two times a day (BID) | ORAL | Status: AC

## 2011-10-21 MED ORDER — SUCCINYLCHOLINE CHLORIDE 20 MG/ML IJ SOLN
INTRAMUSCULAR | Status: DC | PRN
  Administered 2011-10-21: 100 mg via INTRAVENOUS

## 2011-10-21 MED ORDER — PROMETHAZINE HCL 25 MG/ML IJ SOLN
6.2500 mg | INTRAMUSCULAR | Status: DC | PRN
Start: 2011-10-21 — End: 2011-10-21

## 2011-10-21 MED ORDER — MIDAZOLAM HCL 5 MG/5ML IJ SOLN
INTRAMUSCULAR | Status: DC | PRN
  Administered 2011-10-21: 2 mg via INTRAVENOUS

## 2011-10-21 MED ORDER — OXYCODONE-ACETAMINOPHEN 5-325 MG PO TABS
1.0000 | ORAL_TABLET | Freq: Once | ORAL | Status: AC
  Administered 2011-10-21: 1 via ORAL

## 2011-10-21 MED ORDER — OXYCODONE HCL 5 MG PO TABS
5.0000 mg | ORAL_TABLET | Freq: Once | ORAL | Status: AC
  Administered 2011-10-21: 5 mg via ORAL

## 2011-10-21 MED ORDER — CEFAZOLIN SODIUM 1-5 GM-% IV SOLN: 1.0000 g | INTRAVENOUS | Status: DC

## 2011-10-21 MED ORDER — LIDOCAINE HCL (CARDIAC) 20 MG/ML IV SOLN
INTRAVENOUS | Status: DC | PRN
  Administered 2011-10-21: 50 mg via INTRAVENOUS

## 2011-10-21 MED ORDER — DEXAMETHASONE SODIUM PHOSPHATE 4 MG/ML IJ SOLN
INTRAMUSCULAR | Status: DC | PRN
  Administered 2011-10-21: 10 mg via INTRAVENOUS

## 2011-10-21 MED ORDER — FENTANYL CITRATE 0.05 MG/ML IJ SOLN: 25.0000 ug | INTRAMUSCULAR | Status: DC | PRN

## 2011-10-21 MED ORDER — CHLORHEXIDINE GLUCONATE 4 % EX LIQD: CUTANEOUS | Status: DC

## 2011-10-21 MED ORDER — FENTANYL CITRATE 0.05 MG/ML IJ SOLN
INTRAMUSCULAR | Status: DC | PRN
  Administered 2011-10-21 (×2): 50 ug via INTRAVENOUS
  Administered 2011-10-21: 100 ug via INTRAVENOUS

## 2011-10-21 MED ORDER — LACTATED RINGERS IV SOLN
INTRAVENOUS | Status: DC
  Administered 2011-10-21 (×2): via INTRAVENOUS

## 2011-10-21 MED ORDER — EPHEDRINE SULFATE 50 MG/ML IJ SOLN
INTRAMUSCULAR | Status: DC | PRN
  Administered 2011-10-21: 5 mg via INTRAVENOUS

## 2011-10-21 MED ORDER — BACITRACIN-NEOMYCIN-POLYMYXIN OINTMENT TUBE
TOPICAL_OINTMENT | CUTANEOUS | Status: DC | PRN
  Administered 2011-10-21: 1 via TOPICAL

## 2011-10-21 MED ORDER — 0.9 % SODIUM CHLORIDE (POUR BTL) OPTIME
TOPICAL | Status: DC | PRN
  Administered 2011-10-21: 500 mL

## 2011-10-21 SURGICAL SUPPLY — 64 items
ADH SKN CLS APL DERMABOND .7 (GAUZE/BANDAGES/DRESSINGS)
APL SKNCLS STERI-STRIP NONHPOA (GAUZE/BANDAGES/DRESSINGS)
APPLICATOR COTTON TIP 6IN STRL (MISCELLANEOUS) IMPLANT
BAG DECANTER FOR FLEXI CONT (MISCELLANEOUS) IMPLANT
BAG URINE DRAINAGE (UROLOGICAL SUPPLIES) ×1 IMPLANT
BANDAGE CO FLEX L/F 2IN X 5YD (GAUZE/BANDAGES/DRESSINGS) IMPLANT
BANDAGE GAUZE ELAST BULKY 4 IN (GAUZE/BANDAGES/DRESSINGS) ×2 IMPLANT
BENZOIN TINCTURE PRP APPL 2/3 (GAUZE/BANDAGES/DRESSINGS) ×1 IMPLANT
BLADE HEX COATED 2.75 (ELECTRODE) ×2 IMPLANT
BLADE SURG 10 STRL SS (BLADE) IMPLANT
BLADE SURG 15 STRL LF DISP TIS (BLADE) ×2 IMPLANT
BLADE SURG 15 STRL SS (BLADE) ×4
BLADE SURG ROTATE 9660 (MISCELLANEOUS) ×1 IMPLANT
CANISTER SUCTION 1200CC (MISCELLANEOUS) ×1 IMPLANT
CANISTER SUCTION 2500CC (MISCELLANEOUS) IMPLANT
CATH FOLEY 2WAY SLVR  5CC 16FR (CATHETERS) ×1
CATH FOLEY 2WAY SLVR 5CC 16FR (CATHETERS) ×1 IMPLANT
CHLORAPREP W/TINT 26ML (MISCELLANEOUS) ×2 IMPLANT
CLOTH BEACON ORANGE TIMEOUT ST (SAFETY) ×2 IMPLANT
COVER MAYO STAND STRL (DRAPES) ×4 IMPLANT
COVER TABLE BACK 60X90 (DRAPES) ×2 IMPLANT
DERMABOND ADVANCED (GAUZE/BANDAGES/DRESSINGS)
DERMABOND ADVANCED .7 DNX12 (GAUZE/BANDAGES/DRESSINGS) IMPLANT
DISSECTOR ROUND CHERRY 3/8 STR (MISCELLANEOUS) IMPLANT
DRAPE EXTREMITY T 121X128X90 (DRAPE) ×2 IMPLANT
DRAPE LAPAROTOMY TRNSV 102X78 (DRAPE) IMPLANT
DRSG TEGADERM 4X4.75 (GAUZE/BANDAGES/DRESSINGS) ×2 IMPLANT
ELECT REM PT RETURN 9FT ADLT (ELECTROSURGICAL) ×2
ELECTRODE REM PT RTRN 9FT ADLT (ELECTROSURGICAL) ×1 IMPLANT
GAUZE SPONGE 4X4 12PLY STRL LF (GAUZE/BANDAGES/DRESSINGS) ×2 IMPLANT
GLOVE BIO SURGEON STRL SZ8 (GLOVE) ×2 IMPLANT
GOWN PREVENTION PLUS LG XLONG (DISPOSABLE) ×2 IMPLANT
GOWN STRL REIN XL XLG (GOWN DISPOSABLE) ×2 IMPLANT
HOLDER FOLEY CATH W/STRAP (MISCELLANEOUS) ×1 IMPLANT
NS IRRIG 500ML POUR BTL (IV SOLUTION) ×2 IMPLANT
PACK BASIN DAY SURGERY FS (CUSTOM PROCEDURE TRAY) ×2 IMPLANT
PENCIL BUTTON HOLSTER BLD 10FT (ELECTRODE) ×2 IMPLANT
PLUG CATH AND CAP STER (CATHETERS) ×2 IMPLANT
RETRACTOR WILSON SYSTEM (INSTRUMENTS) ×2 IMPLANT
SET TITAN CYLINDER SCROTAL 18C ×1 IMPLANT
SPONGE LAP 18X18 X RAY DECT (DISPOSABLE) IMPLANT
SPONGE LAP 4X18 X RAY DECT (DISPOSABLE) ×4 IMPLANT
STRIP CLOSURE SKIN 1/2X4 (GAUZE/BANDAGES/DRESSINGS) IMPLANT
SUPPORT SCROTAL LG STRP (MISCELLANEOUS) IMPLANT
SURGILUBE 2OZ TUBE FLIPTOP (MISCELLANEOUS) ×2 IMPLANT
SUT CHROMIC 3 0 SH 27 (SUTURE) ×3 IMPLANT
SUT PDS AB 2-0 CT2 27 (SUTURE) ×6 IMPLANT
SUT VIC AB 2-0 UR6 27 (SUTURE) ×12 IMPLANT
SUT VIC AB 3-0 SH 27 (SUTURE)
SUT VIC AB 3-0 SH 27X BRD (SUTURE) IMPLANT
SUT VICRYL 0 UR6 27IN ABS (SUTURE) IMPLANT
SUT VICRYL 4-0 PS2 18IN ABS (SUTURE) IMPLANT
SYR 20CC LL (SYRINGE) ×2 IMPLANT
SYR 50ML LL SCALE MARK (SYRINGE) ×4 IMPLANT
SYR BULB IRRIGATION 50ML (SYRINGE) ×2 IMPLANT
SYRINGE 10CC LL (SYRINGE) ×2 IMPLANT
TITAN SCROTAL ZERO DEGREE ANGLE CYLINDER SET WITH PUMP 18CM ×1 IMPLANT
TOWEL OR 17X24 6PK STRL BLUE (TOWEL DISPOSABLE) ×5 IMPLANT
TRAY DSU PREP LF (CUSTOM PROCEDURE TRAY) ×2 IMPLANT
TUBE CONNECTING 12X1/4 (SUCTIONS) ×2 IMPLANT
Titan Patient record label/assembly kit ×1 IMPLANT
WATER STERILE IRR 3000ML UROMA (IV SOLUTION) IMPLANT
WATER STERILE IRR 500ML POUR (IV SOLUTION) ×2 IMPLANT
YANKAUER SUCT BULB TIP NO VENT (SUCTIONS) ×2 IMPLANT

## 2011-10-21 NOTE — Anesthesia Preprocedure Evaluation (Addendum)
Anesthesia Evaluation  Patient identified by MRN, date of birth, ID band Patient awake    Reviewed: Allergy & Precautions, H&P , NPO status , Patient's Chart, lab work & pertinent test results  Airway Mallampati: II TM Distance: >3 FB Neck ROM: Full    Dental No notable dental hx.    Pulmonary sleep apnea , former smoker breath sounds clear to auscultation  Pulmonary exam normal       Cardiovascular Exercise Tolerance: Good hypertension, Pt. on medications Rhythm:Regular Rate:Normal     Neuro/Psych PSYCHIATRIC DISORDERS PTSDnegative neurological ROS     GI/Hepatic negative GI ROS, Neg liver ROS,   Endo/Other  negative endocrine ROS  Renal/GU negative Renal ROS  negative genitourinary   Musculoskeletal negative musculoskeletal ROS (+)   Abdominal (+) + obese,   Peds negative pediatric ROS (+)  Hematology negative hematology ROS (+)   Anesthesia Other Findings   Reproductive/Obstetrics negative OB ROS                          Anesthesia Physical Anesthesia Plan  ASA: II  Anesthesia Plan: General   Post-op Pain Management:    Induction: Intravenous  Airway Management Planned:   Additional Equipment:   Intra-op Plan:   Post-operative Plan: Extubation in OR  Informed Consent: I have reviewed the patients History and Physical, chart, labs and discussed the procedure including the risks, benefits and alternatives for the proposed anesthesia with the patient or authorized representative who has indicated his/her understanding and acceptance.   Dental advisory given  Plan Discussed with: CRNA  Anesthesia Plan Comments: (In December 2012, LMA #4 supreme and LMA #5 tried without success, then intubated without difficulty.)        Anesthesia Quick Evaluation

## 2011-10-21 NOTE — Anesthesia Procedure Notes (Addendum)
Procedure Name: Intubation Date/Time: 10/21/2011 10:05 AM Performed by: Maris Berger T Pre-anesthesia Checklist: Patient identified, Emergency Drugs available, Suction available and Patient being monitored Patient Re-evaluated:Patient Re-evaluated prior to inductionOxygen Delivery Method: Circle System Utilized Preoxygenation: Pre-oxygenation with 100% oxygen Intubation Type: IV induction Ventilation: Mask ventilation without difficulty Laryngoscope Size: Mac and 3 Grade View: Grade I Tube type: Oral Tube size: 8.0 mm Number of attempts: 1 Airway Equipment and Method: stylet and Stylet Placement Confirmation: ETT inserted through vocal cords under direct vision,  positive ETCO2 and breath sounds checked- equal and bilateral Secured at: 22 cm Tube secured with: Tape Dental Injury: Teeth and Oropharynx as per pre-operative assessment  Comments: LMA 5 inserted, met with resistance after tip passed uvula, poor seal.  Removed and reinserted with resistance.  Easily mask ventilated, IV sux and intubated easily.  Epiglottis long, narrow and stiff, probably why LMA could not seat.

## 2011-10-21 NOTE — Discharge Instructions (Signed)
HOME CARE INSTRUCTIONS FOR SCROTAL PROCEDURES  Wound Care & Hygiene: You may apply an ice bag to the scrotum.  This may help decrease swelling.  You may have a dressing held in place by an athletic supporter.  You may remove the dressing in 24 hours and shower in 48 hours.  Continue to use the athletic supporter until your follow-up visit with your doctor.  Activity: Rest today - not necessarily flat bed rest.  Just take it easy.  You should not do strenuous activities until your follow-up visit with your doctor.  You may resume light activity in 48 hours.  Return to Work:  Your doctor will advise you of this depending on the type of work you do  Diet: Drink liquids or eat a light diet this evening.  You may resume a regular diet tomorrow.  General Expectations: You may have a small amount of bleeding.  The scrotum may be swollen or bruised for about a week.  Call your Doctor if these occur:  -persistent or heavy bleeding  -temperature of 101 degrees or more  -severe pain, not relieved by your pain medication  Return to Doctor's Office:  *** Call to set up and appointment.  Patient Signature:  __________________________________________________  Nurse's Signature:  __________________________________________________   Post Anesthesia Home Care Instructions  Activity: Get plenty of rest for the remainder of the day. A responsible adult should stay with you for 24 hours following the procedure.  For the next 24 hours, DO NOT: -Drive a car -Advertising copywriter -Drink alcoholic beverages -Take any medication unless instructed by your physician -Make any legal decisions or sign important papers.  Meals: Start with liquid foods such as gelatin or soup. Progress to regular foods as tolerated. Avoid greasy, spicy, heavy foods. If nausea and/or vomiting occur, drink only clear liquids until the nausea and/or vomiting subsides. Call your physician if vomiting continues.  Special  Instructions/Symptoms: Your throat may feel dry or sore from the anesthesia or the breathing tube placed in your throat during surgery. If this causes discomfort, gargle with warm salt water. The discomfort should disappear within 24 hours.

## 2011-10-21 NOTE — Op Note (Signed)
PATIENT:  Mathew Malone  PRE-OPERATIVE DIAGNOSIS:  Malfunctioning inflatable penile prosthesis  POST-OPERATIVE DIAGNOSIS:  Same  PROCEDURE:  Procedure(s): 1. Removal of pump and cylinders of 3 piece inflatable in all prosthesis (Coloplast) 2. Replacement of pump and cylinders of three-piece inflatable prosthesis.  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 He had difficulty inflating and deflating the device and therefore underwent revision by removal and replacement of his scrotal pump but still was unable to inflated deflate the device. We therefore discussed removal and replacement of all components except the reservoir if it appeared to be functioning properly. He understands and elected to proceed.  ANESTHESIA:  General  EBL:  Minimal  Device: Coloplast Titan 18 cm cylinders with 6 cm rear-tip extenders bilaterally.  LOCAL MEDICATIONS USED:  None  SPECIMEN: Explanted cylinders and pump  DISPOSITION OF SPECIMEN:  Device sent back to the company.  Description of procedure: The patient was taken to the major operating room, placed on the table and administered general anesthesia in the supine position. His genitalia was then scrubbed for 10 minutes, painted and then sterilely draped. An official timeout was then performed.  A 16 French Foley catheter was placed in the bladder and the bladder was drained and the catheter was plugged. I attempted to inflate and deflate the device and found there was very great difficulty but was able to get the device to cycle.  A midline median raphae scrotal incision was then made and I then cut down on the pump with the Bovie electrocautery unit. There was no evidence of periodic material surrounding the device. A ring retractor was used for exposure and I then cut down on the tubing from the pump to the right corpus cavernosum.  I was able to then incised the corpus cavernosum distally from the level of the tubing and placed a right angle clamp beneath the prosthesis and extracted it. I irrigated the corpus cavernosum proximally and distally with antibiotic solution. I then measured the distance proximally and distally and found, from stay sutures placed in the corpus cavernosum, he measured 12 cm proximally and 12 cm distally. I irrigated the corpus cavernosum again and directed my attention to the left cylinder tubing and cut down on to the corpus cavernosum in an identical fashion. I extracted the cylinder and all of the rear-tip extenders on both sides. I irrigated and measured this side and again found an identical measurements of 24 cm.  I placed a shod hemostat on the tubing from the reservoir. I then connected a 60 cc syringe to the tubing of the reservoir and drained the reservoir of 60 cc of clear fluid. I then filled the reservoir with 70 cc of clear sterile saline. There was no back pressure. A shod hemostat was therefore placed on the tubing and it was my impression that the reservoir was functioning properly with no evidence of leak or capsule formation.  I then chose an 18 cm cylinder and placed a 1 cm then a 2 cm then a 3 cm rear-tip extender on each side for a total of 6 cm and a total length of 24 cm. I used a Keith needle and the [employer] inserter to pass the suture affixed to the distal aspect of the right cylinder into the right distal corpus cavernosum. I then performed an identical procedure on the left-hand side. I placed the proximal portion of the cylinder in the  corporotomy and used the affixed suture to position the distal portion of the cylinder in the corpus cavernosum on both sides. I then inflated the device and found that it required 53 cc. I inflated it by pumping the pump and found no abnormal resistance. I then deflated the device and proceeded to close the corporotomies. This was performed by using  running 3-0 PDS suture. Both corporotomies were closed after irrigation with antibiotic solution and I then proceeded to attach the pump tubing and reservoir tubing.  I excised the excess tubing from the pump after having placed a shod hemostat on the tubing. I used a straight connector to connect the 2 pieces of tubing. I checked the integrity of the connection and noted it was strong. I removed the shod hemostat and again inflated and deflated the device without difficulty. I deflated the device and pumped it up about three quarters of the way to aid with hemostasis. I irrigated the scrotum once again with antibiotic solution.  I then redeveloped the scrotal pocket in the right hemiscrotum and placed the pump in this location. I irrigated the wound one last time with antibiotic irrigation and then closed the deep scrotal tissue over the tubing and pump with running 3-0 chromic suture. A second layer was then closed over this first layer with running 3-0 chromic and then the skin was closed with running 3-0 chromic. I then inflated and deflated the device one last time and it appeared to be functioning properly. Antibiotic ointment was applied to the suture line as well as a sterile gauze dressing. Fluffed Curlex and a scrotal support were then applied and the catheter was removed after the bladder was drained one final time. The patient was awakened and taken recovery room in stable and satisfactory condition. He tolerated the procedure well and there were no intraoperative complications. Needle sponge and instrument counts were correct at the end of the operation.  PLAN OF CARE: He will be discharged home after he fully recovers.  PATIENT DISPOSITION:  PACU - hemodynamically stable.

## 2011-10-21 NOTE — Anesthesia Postprocedure Evaluation (Signed)
  Anesthesia Post-op Note  Patient: Mathew Malone  Procedure(s) Performed: Procedure(s) (LRB): PENILE PROTHESIS INFLATABLE (N/A)  Patient Location: PACU  Anesthesia Type: General  Level of Consciousness: awake and alert   Airway and Oxygen Therapy: Patient Spontanous Breathing  Post-op Pain: mild  Post-op Assessment: Post-op Vital signs reviewed, Patient's Cardiovascular Status Stable, Respiratory Function Stable, Patent Airway and No signs of Nausea or vomiting  Post-op Vital Signs: stable  Complications: No apparent anesthesia complications

## 2011-10-21 NOTE — Interval H&P Note (Signed)
History and Physical Interval Note:  10/21/2011 9:41 AM  Gust Brooms III  has presented today for surgery, with the diagnosis of Malfunctioning Inflatable Penile Prosthesis  The various methods of treatment have been discussed with the patient and family. After consideration of risks, benefits and other options for treatment, the patient has consented to  Procedure(s) (LRB): PENILE PROTHESIS INFLATABLE (N/A) as a surgical intervention .  The patient's history has been reviewed, patient examined, no change in status, stable for surgery.  I have reviewed the patients' chart and labs.  Questions were answered to the patient's satisfaction.     Garnett Farm

## 2011-10-21 NOTE — Transfer of Care (Signed)
Immediate Anesthesia Transfer of Care Note  Patient: Mathew Malone  Procedure(s) Performed: Procedure(s) (LRB): PENILE PROTHESIS INFLATABLE (N/A)  Patient Location: PACU  Anesthesia Type: General  Level of Consciousness: sedated and responds to stimulation  Airway & Oxygen Therapy: Patient Spontanous Breathing and Patient connected to face mask oxygen  Post-op Assessment: Report given to PACU RN  Post vital signs: Reviewed and stable  Complications: No apparent anesthesia complications

## 2011-10-28 ENCOUNTER — Encounter (HOSPITAL_BASED_OUTPATIENT_CLINIC_OR_DEPARTMENT_OTHER): Payer: Self-pay

## 2011-10-29 ENCOUNTER — Encounter (HOSPITAL_BASED_OUTPATIENT_CLINIC_OR_DEPARTMENT_OTHER): Payer: Self-pay | Admitting: Urology

## 2012-04-01 ENCOUNTER — Other Ambulatory Visit: Payer: Self-pay | Admitting: Urology

## 2012-05-06 ENCOUNTER — Emergency Department (HOSPITAL_COMMUNITY)
Admission: EM | Admit: 2012-05-06 | Discharge: 2012-05-06 | Disposition: A | Discharge status: Home / Self Care | Payer: Medicare Other | Attending: Emergency Medicine | Admitting: Emergency Medicine

## 2012-05-06 ENCOUNTER — Other Ambulatory Visit: Payer: Self-pay

## 2012-05-06 ENCOUNTER — Encounter (HOSPITAL_COMMUNITY): Payer: Self-pay | Admitting: *Deleted

## 2012-05-06 DIAGNOSIS — Z8669 Personal history of other diseases of the nervous system and sense organs: Secondary | ICD-10-CM | POA: Insufficient documentation

## 2012-05-06 DIAGNOSIS — Z87891 Personal history of nicotine dependence: Secondary | ICD-10-CM | POA: Insufficient documentation

## 2012-05-06 DIAGNOSIS — T50901A Poisoning by unspecified drugs, medicaments and biological substances, accidental (unintentional), initial encounter: Secondary | ICD-10-CM

## 2012-05-06 DIAGNOSIS — Y9389 Activity, other specified: Secondary | ICD-10-CM | POA: Insufficient documentation

## 2012-05-06 DIAGNOSIS — Y929 Unspecified place or not applicable: Secondary | ICD-10-CM | POA: Insufficient documentation

## 2012-05-06 DIAGNOSIS — T43624A Poisoning by amphetamines, undetermined, initial encounter: Secondary | ICD-10-CM | POA: Insufficient documentation

## 2012-05-06 DIAGNOSIS — E785 Hyperlipidemia, unspecified: Secondary | ICD-10-CM | POA: Insufficient documentation

## 2012-05-06 DIAGNOSIS — Z8546 Personal history of malignant neoplasm of prostate: Secondary | ICD-10-CM | POA: Insufficient documentation

## 2012-05-06 DIAGNOSIS — F4310 Post-traumatic stress disorder, unspecified: Secondary | ICD-10-CM | POA: Insufficient documentation

## 2012-05-06 DIAGNOSIS — I10 Essential (primary) hypertension: Secondary | ICD-10-CM | POA: Insufficient documentation

## 2012-05-06 DIAGNOSIS — T43601A Poisoning by unspecified psychostimulants, accidental (unintentional), initial encounter: Secondary | ICD-10-CM | POA: Insufficient documentation

## 2012-05-06 DIAGNOSIS — Z7982 Long term (current) use of aspirin: Secondary | ICD-10-CM | POA: Insufficient documentation

## 2012-05-06 DIAGNOSIS — M129 Arthropathy, unspecified: Secondary | ICD-10-CM | POA: Insufficient documentation

## 2012-05-06 DIAGNOSIS — Z79899 Other long term (current) drug therapy: Secondary | ICD-10-CM | POA: Insufficient documentation

## 2012-05-06 LAB — CBC WITH DIFFERENTIAL/PLATELET
Basophils Absolute: 0 10*3/uL (ref 0.0–0.1)
Eosinophils Relative: 2 % (ref 0–5)
Lymphocytes Relative: 19 % (ref 12–46)
Neutro Abs: 5.8 10*3/uL (ref 1.7–7.7)
Neutrophils Relative %: 72 % (ref 43–77)
Platelets: 177 10*3/uL (ref 150–400)
RDW: 13.5 % (ref 11.5–15.5)
WBC: 8 10*3/uL (ref 4.0–10.5)

## 2012-05-06 LAB — COMPREHENSIVE METABOLIC PANEL
ALT: 18 U/L (ref 0–53)
AST: 21 U/L (ref 0–37)
CO2: 26 mEq/L (ref 19–32)
Calcium: 10.1 mg/dL (ref 8.4–10.5)
Chloride: 95 mEq/L — ABNORMAL LOW (ref 96–112)
GFR calc non Af Amer: 51 mL/min — ABNORMAL LOW (ref >90)
Sodium: 135 mEq/L (ref 135–145)

## 2012-05-06 LAB — POCT I-STAT 3, VENOUS BLOOD GAS (G3P V)
Acid-Base Excess: 3 mmol/L — ABNORMAL HIGH (ref 0.0–2.0)
O2 Saturation: 45 %
pO2, Ven: 26 mmHg — CL (ref 30.0–45.0)

## 2012-05-06 LAB — ACETAMINOPHEN LEVEL: Acetaminophen (Tylenol), Serum: <15 ug/mL (ref 10–30)

## 2012-05-06 NOTE — ED Notes (Signed)
Pt denies any pain or requests at this time, resting at present, NAD noted.

## 2012-05-06 NOTE — ED Provider Notes (Signed)
MSE was initiated and I personally evaluated the patient and placed orders (if any) at  3:12 PM on May 06, 2012.  Mathew Malone is a 67 y.o. male complaining of accidental overdose at 12:30 PM today. Patient puts his daily pills and vitamins into old bottles and takes with him when he leaves the house. He thought he was taking his daily dosage and accidentally took 14 qsymia (phentermine and topiramate extended-release) pills. Patient denies any agitation, nausea vomiting, chest pain, shortness of breath, abdominal pain, change in bowel or bladder habits.  Workup initiated a CBC, CMET, VBG, 12 lead and cardiac monitoring. VSS  The patient appears stable so that the remainder of the MSE may be completed by another provider.  Wynetta Emery, PA-C 05/06/12 1654

## 2012-05-06 NOTE — ED Notes (Signed)
Pt took 15 tablets of Qsymia 7.5mg /46mg  at 1130 by accident, pt thought he was taking his daily vitamins.  No complaints

## 2012-05-06 NOTE — ED Provider Notes (Signed)
History     CSN: [id number]  Arrival date & time 05/06/12  1425   First MD Initiated Contact with Patient 05/06/12 1434      Chief Complaint  Patient presents with  . Drug Overdose    (Consider location/radiation/quality/duration/timing/severity/associated sxs/prior treatment) Patient is a 67 y.o. male presenting with Ingested Medication. The history is provided by the patient. No language interpreter was used.  Ingestion This is a new problem. The current episode started today. The problem occurs constantly. The problem has been unchanged. Pertinent negatives include no abdominal pain, chest pain, chills, congestion, coughing, fever, headaches, nausea, rash, sore throat or vomiting. Nothing aggravates the symptoms. He has tried nothing for the symptoms. The treatment provided no relief.    Past Medical History  Diagnosis Date  . Hyperlipidemia   . Hypertension     stress test- 2011  . Cancer 2008    prostate  . Arthritis   . Sleep apnea     cpap at times  . Post traumatic stress disorder (PTSD)   . HOH (hard of hearing)     bilateral    Past Surgical History  Procedure Date  . Joint replacement     lt knee-2012  / rt knee -2011  . Shoulder arthroscopy w/ rotator cuff repair     bilateral  . Robot assisted laparoscopic radical prostatectomy 2008  . Penile prosthesis placement 12/2010  . Penile prosthesis implant 04/05/2011    Procedure: PENILE PROTHESIS INFLATABLE;  Surgeon: Garnett Farm, MD;  Location: [facility];  Service: Urology;  Laterality: N/A;   EXPLORATION AND REVISION OF INFLATABLE PENILE   PROSTHESIS    PUMP  . Penile prosthesis implant 10/21/2011    Procedure: PENILE PROTHESIS INFLATABLE;  Surgeon: Garnett Farm, MD;  Location: [facility];  Service: Urology;  Laterality: N/A;   COLOPLAST      No family history on file.  History  Substance Use Topics  . Smoking status: Former Smoker    Quit date: 04/04/1971  .  Smokeless tobacco: Not on file  . Alcohol Use: No      Review of Systems  Constitutional: Negative for fever and chills.  HENT: Negative for congestion and sore throat.   Respiratory: Negative for cough and shortness of breath.   Cardiovascular: Negative for chest pain and leg swelling.  Gastrointestinal: Negative for nausea, vomiting, abdominal pain, diarrhea and constipation.  Genitourinary: Negative for dysuria and frequency.  Skin: Negative for color change and rash.  Neurological: Negative for dizziness and headaches.  Psychiatric/Behavioral: Negative for confusion and agitation.  All other systems reviewed and are negative.    Allergies  Review of patient's allergies indicates no known allergies.  Home Medications   Current Outpatient Rx  Name  Route  Sig  Dispense  Refill  . ASCORBIC ACID 250 MG PO CHEW   Oral   Chew 250 mg by mouth daily.           . ASPIRIN 81 MG PO CHEW   Oral   Chew 81 mg by mouth daily.         Marland Kitchen CALCIUM CARBONATE-VITAMIN D 500-200 MG-UNIT PO TABS   Oral   Take 1 tablet by mouth daily.         Marland Kitchen COENZYME Q10 30 MG PO CAPS   Oral   Take 30 mg by mouth daily.          Marland Kitchen DICLOFENAC SODIUM 75 MG PO TBEC  Oral   Take 75 mg by mouth 2 (two) times daily.          Marland Kitchen GABAPENTIN 600 MG PO TABS   Oral   Take 600 mg by mouth 3 (three) times daily.          Marland Kitchen GALANTAMINE HYDROBROMIDE ER 16 MG PO CP24   Oral   Take 16 mg by mouth daily with breakfast.         . HYDROCODONE-ACETAMINOPHEN 5-500 MG PO TABS   Oral   Take 1 tablet by mouth every 6 (six) hours as needed. For pain         . METHYLCELLULOSE 1 % OP SOLN   Both Eyes   Place 1 drop into both eyes as needed. For dryness         . ADULT MULTIVITAMIN W/MINERALS CH   Oral   Take 1 tablet by mouth daily.         Marland Kitchen FISH OIL 1000 MG PO CAPS   Oral   Take 1 capsule by mouth daily.          Marland Kitchen PHENTERMINE-TOPIRAMATE 7.5-46 MG PO CP24   Oral   Take 1 capsule by  mouth daily. Take 1 capsule by mouth daily start day 15 after 14 day course.         Marland Kitchen SERTRALINE HCL 100 MG PO TABS   Oral   Take 100 mg by mouth 2 (two) times daily.          Marland Kitchen SIMVASTATIN 20 MG PO TABS   Oral   Take 20 mg by mouth at bedtime.          . TRIAMTERENE-HCTZ 75-50 MG PO TABS   Oral   Take 1 tablet by mouth 2 (two) times daily.            BP 155/91  Pulse 80  Temp 98.6 F (37 C) (Oral)  Resp 18  SpO2 99%  Physical Exam  Constitutional: He is oriented to person, place, and time. He appears well-developed and well-nourished. No distress.  HENT:  Head: Normocephalic and atraumatic.  Eyes: EOM are normal. Pupils are equal, round, and reactive to light.  Neck: Normal range of motion. Neck supple.  Cardiovascular: Normal rate and regular rhythm.   Pulmonary/Chest: Effort normal. No respiratory distress.  Abdominal: Soft. He exhibits no distension.  Musculoskeletal: Normal range of motion. He exhibits no edema.  Neurological: He is alert and oriented to person, place, and time. He has normal strength. No cranial nerve deficit or sensory deficit. Coordination normal. GCS eye subscore is 4. GCS verbal subscore is 5. GCS motor subscore is 6.  Skin: Skin is warm and dry.  Psychiatric: He has a normal mood and affect. His behavior is normal. His mood appears not anxious. His speech is not rapid and/or pressured.    ED Course  Procedures (including critical care time)  Results for orders placed during the hospital encounter of 05/06/12  CBC WITH DIFFERENTIAL      Component Value Range   WBC 8.0  4.0 - 10.5 K/uL   RBC 5.18  4.22 - 5.81 MIL/uL   Hemoglobin 14.3  13.0 - 17.0 g/dL   HCT 16.1  09.6 - 04.5 %   MCV 79.7  78.0 - 100.0 fL   MCH 27.6  26.0 - 34.0 pg   MCHC 34.6  30.0 - 36.0 g/dL   RDW 40.9  81.1 - 91.4 %   Platelets 177  150 - 400 K/uL   Neutrophils Relative 72  43 - 77 %   Neutro Abs 5.8  1.7 - 7.7 K/uL   Lymphocytes Relative 19  12 - 46 %    Lymphs Abs 1.5  0.7 - 4.0 K/uL   Monocytes Relative 7  3 - 12 %   Monocytes Absolute 0.6  0.1 - 1.0 K/uL   Eosinophils Relative 2  0 - 5 %   Eosinophils Absolute 0.1  0.0 - 0.7 K/uL   Basophils Relative 1  0 - 1 %   Basophils Absolute 0.0  0.0 - 0.1 K/uL  COMPREHENSIVE METABOLIC PANEL      Component Value Range   Sodium 135  135 - 145 mEq/L   Potassium 3.1 (*) 3.5 - 5.1 mEq/L   Chloride 95 (*) 96 - 112 mEq/L   CO2 26  19 - 32 mEq/L   Glucose, Bld 99  70 - 99 mg/dL   BUN 31 (*) 6 - 23 mg/dL   Creatinine, Ser 1.61 (*) 0.50 - 1.35 mg/dL   Calcium 09.6  8.4 - 04.5 mg/dL   Total Protein 8.1  6.0 - 8.3 g/dL   Albumin 4.5  3.5 - 5.2 g/dL   AST 21  0 - 37 U/L   ALT 18  0 - 53 U/L   Alkaline Phosphatase 61  39 - 117 U/L   Total Bilirubin 0.6  0.3 - 1.2 mg/dL   GFR calc non Af Amer 51 (*) >90 mL/min   GFR calc Af Amer 59 (*) >90 mL/min  POCT I-STAT 3, BLOOD GAS (G3P V)      Component Value Range   pH, Ven 7.372 (*) 7.250 - 7.300   pCO2, Ven 50.5 (*) 45.0 - 50.0 mmHg   pO2, Ven 26.0 (*) 30.0 - 45.0 mmHg   Bicarbonate 29.3 (*) 20.0 - 24.0 mEq/L   TCO2 31  0 - 100 mmol/L   O2 Saturation 45.0     Acid-Base Excess 3.0 (*) 0.0 - 2.0 mmol/L   Sample type VENOUS     Comment NOTIFIED PHYSICIAN    ACETAMINOPHEN LEVEL      Component Value Range   Acetaminophen (Tylenol), Serum <15.0  10 - 30 ug/mL  SALICYLATE LEVEL      Component Value Range   Salicylate Lvl <2.0 (*) 2.8 - 20.0 mg/dL    No results found.  Date: 05/07/2012  Rate: 73  Rhythm: normal sinus rhythm  QRS Axis: normal  Intervals: normal  ST/T Wave abnormalities: normal  Conduction Disutrbances:none  Narrative Interpretation:   Old EKG Reviewed: none available    No diagnosis found.    MDM  Pt w/ PMHx of HTN, HLD now w/ accidental overdose. Pt states he combines all of his pills in one bottle. Took his normal dose of Qsymia 7.5/46 - phentermine/topiramate - wt loss pill at 11am. Pt subsequently went to take the  remainder of his daily meds at 1300 and accidentally took the pills from his Qsymia bottle - 14 tablets. Pt denies agitation, chest pain, dyspnea or seizure activity. Denies any complaints whatsoever. Denies SI.   Exam: afebrile, BP 155/91, pulse 80, no resp distress or hypoxia. PERRLA - 3mm, no focal neuro deficit or termor noted.   Plan: accidental overdose. Doesn't appear to be suicide attempt. Will check tylenol, ASA level, CMP, CBC, VBG and ECG and d/w poison control.  Course: reassessed vitals stable, pt denies any complaints. Tylenol and ASA level normal, no metabolic  acidosis, blood gas and CBC unremarkable. ECG - no interval changes. Pt observed for 6hrs from onset of ingestion per recommendation of poison control w/out change in status. Stable for d/c home. Given return precautions and follow up instructions.   1. Accidental drug ingestion    Hoyle Sauer, MD [address] [facility] [place] [address] [phone]  Schedule an appointment as soon as possible for a visit         Audelia Hives, MD 05/07/12 0104

## 2012-05-06 NOTE — ED Notes (Signed)
Pt instructed to follow up with any worsening symptoms.

## 2012-05-06 NOTE — ED Notes (Addendum)
Pt states he took 14 Qsymia 7.5-46 mgby accident @ aournd 12:30. He states he mistook them for a bottle he carries his other daily medication in. Pt. Denies pain, sob, and headache at this time.

## 2012-05-07 NOTE — ED Provider Notes (Signed)
Medical screening examination/treatment/procedure(s) were performed by non-physician practitioner and as supervising physician I was immediately available for consultation/collaboration.   Lyanne Co, MD 05/07/12 [phone]

## 2012-05-08 ENCOUNTER — Encounter (HOSPITAL_BASED_OUTPATIENT_CLINIC_OR_DEPARTMENT_OTHER): Payer: Self-pay | Admitting: *Deleted

## 2012-05-08 NOTE — ED Provider Notes (Signed)
I have personally seen and examined the patient.  I have discussed the plan of care with the resident.  I have reviewed the documentation on PMH/FH/Soc. History.  I have reviewed the documentation of the resident and agree.  I have reviewed and agree with the ECG interpretation(s) documented by the resident.  Pt stable in ED.  Low suspicion for intentional OD.  Stable for d/c  Joya Gaskins, MD 05/08/12 1148

## 2012-05-08 NOTE — Progress Notes (Addendum)
Pt instructed npo p mn 1/23 x zoloft, zocor, neurontin, razadyne w sip of water.  To [facility] 1/24 @ 0615.  Needs istat on arrival. Ekg, cxr in epic.  Pt requests a return phone call on 1/23 to remind him of his preop instructions.

## 2012-05-14 NOTE — H&P (Signed)
Mathew Malone is a 67 year old male patient who underwent prosthesis implantation for erectile dysfunction.   History of Present Illness         1) Prostate cancer: He was initially diangosed in 2004 and delayed treatment. He is s/p a NNS RAL radical prostatectomy on August 12, 2007. He did undergo a preoperative staging evaluation including a bone scan and CT scan which were negative. He has received adjuvant radiation therapy (68.4 Gy) completed in September 2009. His PSA has been undetectable since treatment.  TNM stage: pT3a N0 Mx Gleason score: 4+3=7 Surgical margins: Positive (bilateral) Pretreatment PSA: 11.42  2) Erectile dysfunction: He had erectile dysfunction even prior to his prostatecotmy. In 8/12 he underwent a 3 piece inflatable penile prosthesis implantation. When he returned for followup to begin inflating the device  It was noted that the device was cycling properly. He then returned having noted he was having significant difficulty inflating the device himself and it did seem there may have been some resistance so the device was revised surgically in 12/12 with the placement of a new pump.  the device remained extremely difficult to inflate and deflate and clearly was not functioning properly. Therefore on 10/21/11 I removed and replaced his cylinders as well as pump. The device functioned completely normally at the end of his surgery.  Interval history: He reports that after he went home, having seen me last, he tried to inflate his device and after a couple of tries was unsuccessful. He's developed a little bit of mild discomfort in the area of the pump but I think that may been because he's been trying to pump it and has not been successful. He tells me that the pump now seems to be remaining depressed. In addition he said that he seemed to note less length in his penis with erection after his last surgery.    Past Medical History Problems  1. History of  Anxiety (Symptom)  300.00 2. History of  Hypercholesterolemia 272.0 3. History of  Hypertension 401.9 4. History of  Mechanical Complication Of Genitourinary Device 996.30 5. History of  Post-traumatic Stress Disorder 309.81 6. Prostate Cancer 185  Surgical History Problems  1. History of  Prostatect Retropubic Radical W/ Nerve Sparing Laparoscopic 2. History of  Removal & Replacement Of Inflatable Sphincter (Same Session) 3. History of  Rotator Cuff Repair Bilateral 4. History of  Shoulder Surgery Right 5. History of  Surg Penis Insertion Of Penile Prosthesis 6. History of  Surg Penis Replacement Of Inflatable Penile Prosthesis 7. History of  Surgery Scrotum Exploration  Current Meds 1. Aspirin Low Dose 81 MG Oral Tablet; Therapy: (Recorded:27Jun2008) to 2. Hydrochlorothiazide CAPS; Therapy: (Recorded:20Jun2013) to 3. Hydrocodone-Acetaminophen 5-500 MG Oral Tablet; TAKE 1-2 EVERY 4 HOURS PRN; Therapy:  28Apr2009 to (Last Rx:20Aug2012) 4. Myrbetriq 25 MG Oral Tablet Extended Release 24 Hour; Take 1 tablet daily; Therapy: 21Dec2012  to (Evaluate:20Nov2014)  Requested for: 25Nov2013; Last Rx:25Nov2013 5. Trimix (PGE 40, PAP 30, PHE 0.5); Prostaglandin , PAP-30 mg, PHE 1 mg.  Use as  directed. Please supply needles and syringes for the patient; Therapy: 14Nov2011 to (Last  Rx:14Nov2011) 6. Zoloft TABS; Therapy: (Recorded:03Mar2009) to  Allergies Medication  1. No Known Drug Allergies  Family History Problems  1. Family history of  Congestive Heart Failure 2. Family history of  Family Health Status Number Of Children 1 son 2 daughters  Social History Problems  1. Alcohol Use occ wine 2. Marital History - Currently Married 3. Occupation: post office  4. Tobacco Use V15.82 smoked 1ppd x 10 yrs quit 87yrs ago  Review of Systems Genitourinary, constitutional, skin, eye, otolaryngeal, hematologic/lymphatic, cardiovascular, pulmonary, endocrine, musculoskeletal, gastrointestinal,  neurological and psychiatric system(s) were reviewed and pertinent findings if present are noted.    Vitals Vital Signs  Blood Pressure: 131 / 75 Temperature: 97.6 F Heart Rate: 64  Physical Exam Constitutional: Well nourished and well developed . No acute distress.  ENT:. The ears and nose are normal in appearance.  Neck: The appearance of the neck is normal and no neck mass is present.  Pulmonary: No respiratory distress and normal respiratory rhythm and effort.  Cardiovascular:. No peripheral edema.  Genitourinary: Examination of the penis demonstrates no lesions and a normal meatus. The penis is circumcised.   Skin: Normal skin turgor, no visible rash and no visible skin lesions.  Neuro/Psych:. Mood and affect are appropriate.   Scrotum reveals no sign of induration or fluctuance. The pump is easily palpable. It's freely mobile. It is located in the right hemiscrotum and in fact when depressed it remains depressed consistent with loss of fluid.   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 Mechanical Complication Of Genitourinary Device 996.30   We discussed the fact that I did not alter the cylinder length at the time of his second procedure. He said after the prosthesis was inserted initially he had good length but after his second surgery he seemed to feel the length was not the same.  Of equal concern to me is the fact that the device seems to have malfunctioned. I'm almost certain there is a leak in the system and I therefore have discussed the fact that repair would require exploration and at the least I would change the pump however I may have to replace one or both cylinders and possibly the reservoir. I told him I did not know until I got him. I did offer a second opinion however he elected to proceed with surgical exploration. He understands that I will assess his cylinder length and the filling of the cylinders at the time of surgery. I told him that I felt that one  possible explanation for a perceived decrease in penile length may have been incomplete inflation of the cylinders.   Plan    He elected to proceed with exploration and replacement of malfunctioning components of his inflatable penile prosthesis.

## 2012-05-15 ENCOUNTER — Encounter (HOSPITAL_BASED_OUTPATIENT_CLINIC_OR_DEPARTMENT_OTHER): Admission: RE | Payer: Self-pay | Source: Ambulatory Visit

## 2012-05-15 ENCOUNTER — Ambulatory Visit (HOSPITAL_BASED_OUTPATIENT_CLINIC_OR_DEPARTMENT_OTHER)
Admission: RE | Admit: 2012-05-15 | Payer: No Typology Code available for payment source | Source: Ambulatory Visit | Admitting: Urology

## 2012-05-15 SURGERY — INSERTION, PENILE PROSTHESIS
Anesthesia: General

## 2013-04-28 ENCOUNTER — Encounter (HOSPITAL_COMMUNITY): Payer: Self-pay | Admitting: General Practice

## 2013-04-28 ENCOUNTER — Inpatient Hospital Stay (HOSPITAL_COMMUNITY)
Admission: AD | Admit: 2013-04-28 | Discharge: 2013-04-29 | DRG: 287 | Disposition: A | Discharge status: Home / Self Care | Payer: Medicare Other | Source: Ambulatory Visit | Attending: Internal Medicine | Admitting: Internal Medicine

## 2013-04-28 DIAGNOSIS — E1129 Type 2 diabetes mellitus with other diabetic kidney complication: Secondary | ICD-10-CM | POA: Diagnosis present

## 2013-04-28 DIAGNOSIS — I200 Unstable angina: Secondary | ICD-10-CM | POA: Diagnosis present

## 2013-04-28 DIAGNOSIS — E785 Hyperlipidemia, unspecified: Secondary | ICD-10-CM | POA: Diagnosis present

## 2013-04-28 DIAGNOSIS — E669 Obesity, unspecified: Secondary | ICD-10-CM | POA: Diagnosis present

## 2013-04-28 DIAGNOSIS — N189 Chronic kidney disease, unspecified: Principal | ICD-10-CM

## 2013-04-28 DIAGNOSIS — N181 Chronic kidney disease, stage 1: Secondary | ICD-10-CM | POA: Diagnosis present

## 2013-04-28 DIAGNOSIS — I509 Heart failure, unspecified: Principal | ICD-10-CM | POA: Diagnosis present

## 2013-04-28 DIAGNOSIS — I2589 Other forms of chronic ischemic heart disease: Secondary | ICD-10-CM | POA: Diagnosis present

## 2013-04-28 DIAGNOSIS — Z79899 Other long term (current) drug therapy: Secondary | ICD-10-CM

## 2013-04-28 DIAGNOSIS — I130 Hypertensive heart and chronic kidney disease with heart failure and stage 1 through stage 4 chronic kidney disease, or unspecified chronic kidney disease: Principal | ICD-10-CM | POA: Diagnosis present

## 2013-04-28 DIAGNOSIS — G4733 Obstructive sleep apnea (adult) (pediatric): Secondary | ICD-10-CM | POA: Diagnosis present

## 2013-04-28 HISTORY — DX: Dependence on other enabling machines and devices: Z99.89

## 2013-04-28 HISTORY — DX: Obstructive sleep apnea (adult) (pediatric): G47.33

## 2013-04-28 HISTORY — DX: Gout, unspecified: M10.9

## 2013-04-28 HISTORY — DX: Shortness of breath: R06.02

## 2013-04-28 LAB — CBC WITH DIFFERENTIAL/PLATELET
BASOS ABS: 0 10*3/uL (ref 0.0–0.1)
Basophils Relative: 0 % (ref 0–1)
EOS PCT: 3 % (ref 0–5)
Eosinophils Absolute: 0.2 10*3/uL (ref 0.0–0.7)
HCT: 46.4 % (ref 39.0–52.0)
Hemoglobin: 15.9 g/dL (ref 13.0–17.0)
Lymphocytes Relative: 22 % (ref 12–46)
Lymphs Abs: 1.5 10*3/uL (ref 0.7–4.0)
MCH: 26.5 pg (ref 26.0–34.0)
MCHC: 34.3 g/dL (ref 30.0–36.0)
MCV: 77.2 fL — AB (ref 78.0–100.0)
Monocytes Absolute: 0.5 10*3/uL (ref 0.1–1.0)
Monocytes Relative: 7 % (ref 3–12)
NEUTROS PCT: 68 % (ref 43–77)
Neutro Abs: 4.8 10*3/uL (ref 1.7–7.7)
Platelets: 124 10*3/uL — ABNORMAL LOW (ref 150–400)
RBC: 6.01 MIL/uL — ABNORMAL HIGH (ref 4.22–5.81)
RDW: 14.4 % (ref 11.5–15.5)
WBC: 7.1 10*3/uL (ref 4.0–10.5)

## 2013-04-28 LAB — COMPREHENSIVE METABOLIC PANEL
ALBUMIN: 3.9 g/dL (ref 3.5–5.2)
ALT: 20 U/L (ref 0–53)
AST: 18 U/L (ref 0–37)
Alkaline Phosphatase: 65 U/L (ref 39–117)
BUN: 15 mg/dL (ref 6–23)
CALCIUM: 10 mg/dL (ref 8.4–10.5)
CO2: 29 mEq/L (ref 19–32)
CREATININE: 1.34 mg/dL (ref 0.50–1.35)
Chloride: 95 mEq/L — ABNORMAL LOW (ref 96–112)
GFR calc Af Amer: 63 mL/min — ABNORMAL LOW (ref >90)
GFR, EST NON AFRICAN AMERICAN: 54 mL/min — AB (ref >90)
Glucose, Bld: 98 mg/dL (ref 70–99)
Potassium: 3.8 mEq/L (ref 3.7–5.3)
SODIUM: 140 meq/L (ref 137–147)
TOTAL PROTEIN: 7.5 g/dL (ref 6.0–8.3)
Total Bilirubin: 1 mg/dL (ref 0.3–1.2)

## 2013-04-28 LAB — PROTIME-INR
INR: 1.2 (ref 0.00–1.49)
Prothrombin Time: 14.9 seconds (ref 11.6–15.2)

## 2013-04-28 LAB — TROPONIN I

## 2013-04-28 MED ORDER — ZOLPIDEM TARTRATE 5 MG PO TABS: 5.0000 mg | ORAL_TABLET | Freq: Every evening | ORAL | Status: DC | PRN

## 2013-04-28 MED ORDER — NITROGLYCERIN IN D5W 200-5 MCG/ML-% IV SOLN
3.0000 ug/min | INTRAVENOUS | Status: DC
Start: 2013-04-28 — End: 2013-04-29

## 2013-04-28 MED ORDER — ASPIRIN 81 MG PO CHEW
324.0000 mg | CHEWABLE_TABLET | ORAL | Status: AC
  Administered 2013-04-29: 324 mg via ORAL
  Filled 2013-04-28: qty 4

## 2013-04-28 MED ORDER — GABAPENTIN 600 MG PO TABS
600.0000 mg | ORAL_TABLET | Freq: Three times a day (TID) | ORAL | Status: DC
  Administered 2013-04-29 (×2): 600 mg via ORAL
  Filled 2013-04-28 (×4): qty 1

## 2013-04-28 MED ORDER — INSULIN ASPART 100 UNIT/ML ~~LOC~~ SOLN: 0.0000 [IU] | SUBCUTANEOUS | Status: DC

## 2013-04-28 MED ORDER — NITROGLYCERIN 0.4 MG SL SUBL: 0.4000 mg | SUBLINGUAL_TABLET | SUBLINGUAL | Status: DC | PRN

## 2013-04-28 MED ORDER — ASPIRIN 81 MG PO CHEW
81.0000 mg | CHEWABLE_TABLET | Freq: Every day | ORAL | Status: DC
  Administered 2013-04-29: 81 mg via ORAL
  Filled 2013-04-28 (×2): qty 1

## 2013-04-28 MED ORDER — CARVEDILOL 6.25 MG PO TABS
6.2500 mg | ORAL_TABLET | Freq: Two times a day (BID) | ORAL | Status: DC
  Filled 2013-04-28 (×3): qty 1

## 2013-04-28 MED ORDER — COENZYME Q10 30 MG PO CAPS: 30.0000 mg | ORAL_CAPSULE | Freq: Every day | ORAL | Status: DC

## 2013-04-28 MED ORDER — POLYVINYL ALCOHOL 1.4 % OP SOLN
1.0000 [drp] | OPHTHALMIC | Status: DC | PRN
  Filled 2013-04-28: qty 15

## 2013-04-28 MED ORDER — ALPRAZOLAM 0.25 MG PO TABS: 0.2500 mg | ORAL_TABLET | Freq: Two times a day (BID) | ORAL | Status: DC | PRN

## 2013-04-28 MED ORDER — HYDROCODONE-ACETAMINOPHEN 5-325 MG PO TABS: 1.0000 | ORAL_TABLET | Freq: Four times a day (QID) | ORAL | Status: DC | PRN

## 2013-04-28 MED ORDER — HEPARIN BOLUS VIA INFUSION
4000.0000 [IU] | Freq: Once | INTRAVENOUS | Status: AC
  Administered 2013-04-28: 4000 [IU] via INTRAVENOUS
  Filled 2013-04-28: qty 4000

## 2013-04-28 MED ORDER — ONDANSETRON HCL 4 MG/2ML IJ SOLN: 4.0000 mg | Freq: Four times a day (QID) | INTRAMUSCULAR | Status: DC | PRN

## 2013-04-28 MED ORDER — SERTRALINE HCL 100 MG PO TABS
100.0000 mg | ORAL_TABLET | Freq: Two times a day (BID) | ORAL | Status: DC
  Administered 2013-04-29: 100 mg via ORAL
  Filled 2013-04-28 (×3): qty 1

## 2013-04-28 MED ORDER — HEPARIN (PORCINE) IN NACL 100-0.45 UNIT/ML-% IJ SOLN
1600.0000 [IU]/h | INTRAMUSCULAR | Status: DC
  Administered 2013-04-28: 1300 [IU]/h via INTRAVENOUS
  Filled 2013-04-28 (×2): qty 250

## 2013-04-28 MED ORDER — ACETAMINOPHEN 325 MG PO TABS: 650.0000 mg | ORAL_TABLET | ORAL | Status: DC | PRN

## 2013-04-28 MED ORDER — GALANTAMINE HYDROBROMIDE ER 16 MG PO CP24
16.0000 mg | ORAL_CAPSULE | Freq: Every day | ORAL | Status: DC
  Filled 2013-04-28 (×2): qty 1

## 2013-04-28 MED ORDER — ATORVASTATIN CALCIUM 80 MG PO TABS
80.0000 mg | ORAL_TABLET | Freq: Every day | ORAL | Status: DC
  Administered 2013-04-29: 80 mg via ORAL
  Filled 2013-04-28 (×2): qty 1

## 2013-04-28 MED ORDER — ASPIRIN 300 MG RE SUPP
300.0000 mg | RECTAL | Status: AC
  Filled 2013-04-28: qty 1

## 2013-04-28 MED ORDER — METHYLCELLULOSE 1 % OP SOLN: 1.0000 [drp] | OPHTHALMIC | Status: DC | PRN

## 2013-04-28 NOTE — Progress Notes (Signed)
ANTICOAGULATION CONSULT NOTE - Initial Consult  Pharmacy Consult for heparin Indication: chest pain/ACS  No Known Allergies  Patient Measurements:   Heparin Dosing Weight: 99.2 kg  Vital Signs: Temp: 98.3 F (36.8 C) (01/07 1900) Temp src: Oral (01/07 1900) BP: 138/86 mmHg (01/07 1900) Pulse Rate: 92 (01/07 1900)  Labs: No results found for this basename: HGB, HCT, PLT, APTT, LABPROT, INR, HEPARINUNFRC, CREATININE, CKTOTAL, CKMB, TROPONINI,  in the last 72 hours  The CrCl is unknown because both a height and weight (above a minimum accepted value) are required for this calculation.   Medical History: Past Medical History  Diagnosis Date  . Hyperlipidemia   . Hypertension     stress test- 2011  . Cancer 2008    prostate  . HOH (hard of hearing)     bilateral  . Exertional shortness of breath     "here lately" (04/28/2013)  . OSA on CPAP   . Arthritis     "fingers; knees; right shoulder" (04/28/2013)  . Gout   . Post traumatic stress disorder (PTSD)     Medications:  Prescriptions prior to admission  Medication Sig Dispense Refill  . allopurinol (ZYLOPRIM) 100 MG tablet Take 100 mg by mouth daily.      . ARIPiprazole (ABILIFY) 30 MG tablet Take 30 mg by mouth daily.      Marland Kitchen ascorbic acid (VITAMIN C) 500 MG tablet Take 500 mg by mouth 2 (two) times daily.      Marland Kitchen aspirin 81 MG chewable tablet Chew 162 mg by mouth at bedtime.      Marland Kitchen co-enzyme Q-10 30 MG capsule Take 30 mg by mouth daily.       Marland Kitchen gabapentin (NEURONTIN) 600 MG tablet Take 1,200 mg by mouth 3 (three) times daily.      Marland Kitchen galantamine (RAZADYNE ER) 8 MG 24 hr capsule Take 8 mg by mouth daily with breakfast.      . mirabegron ER (MYRBETRIQ) 25 MG TB24 tablet Take 25 mg by mouth daily.      . Multiple Vitamin (MULITIVITAMIN WITH MINERALS) TABS Take 1 tablet by mouth daily.      Marland Kitchen omega-3 acid ethyl esters (LOVAZA) 1 G capsule Take 1 g by mouth at bedtime.      Marland Kitchen oxybutynin (DITROPAN) 5 MG tablet Take 5 mg by  mouth daily.      . polyvinyl alcohol (LIQUIFILM TEARS) 1.4 % ophthalmic solution Place into both eyes 2 (two) times daily as needed for dry eyes.      . simvastatin (ZOCOR) 40 MG tablet Take 40 mg by mouth daily.      Marland Kitchen triamterene-hydrochlorothiazide (MAXZIDE) 75-50 MG per tablet Take 0.5 tablets by mouth 2 (two) times daily.      . [DISCONTINUED] calcium-vitamin D (OSCAL WITH D) 500-200 MG-UNIT per tablet Take 1 tablet by mouth daily.      . [DISCONTINUED] Phentermine-Topiramate (QSYMIA) 7.5-46 MG CP24 Take 1 capsule by mouth daily. Take 1 capsule by mouth daily start day 15 after 14 day course.      . [DISCONTINUED] simvastatin (ZOCOR) 20 MG tablet Take 40 mg by mouth at bedtime.         Assessment: Pt with ACS/STEMI rule out, starting heparin drip, hgb 15.9, plts 124, INR 1.2.  Goal of Therapy:  Heparin level 0.3-0.7 units/ml Monitor platelets by anticoagulation protocol: Yes   Plan:  Heparin bolus 4000 units Heparin 1300 units/hr Daily HL Daily CBC   Hughes Better, PharmD,  BCPS Clinical Pharmacist 04/28/2013 8:05 PM

## 2013-04-28 NOTE — H&P (Addendum)
Mathew Malone is an 68 y.o. male.   Chief Complaint: Chest pain HPI: The patient is a 68 year old male who presents with chest pain. [name] is a 36 African-American male with history of hypertension, diabetes mellitus, chronic renal insufficiency, obesity and obstructive sleep apnea is referred to me for urgent evaluation of chest pain and abnormal EKG.  Patient was the past one month has been having exertional chest heaviness in the middle of the chest without any radiation. This was associated with shortness of breath. However in the past 2 weeks even minimal activities around the house, walking up in one half flight of stairs he gets extremely short-winded along with heaviness in the chest and has to sit down. There is no associated nausea, vomiting or diaphoresis. He was evaluated earlier this morning by his primary care physician Dr. Berneta Sages and was found to have nonspecific ST elevations in V1, V2 with T wave inversion that was not evident on prior EKG that was performed on 07/20/2012.  He is accompanied by his wife at the bedside. Denies symptoms of TIA or claudication. Use of CPAP on a regular basis. He is a nonsmoker with regards to tobacco. No history of alcohol or illicit drug use. No recent weight changes. No PND or orthopnea, no leg edema, dizziness, hemoptysis or syncope.  Past Medical History  Diagnosis Date  . Hyperlipidemia   . Hypertension     stress test- 2011  . Cancer 2008    prostate  . HOH (hard of hearing)     bilateral  . Exertional shortness of breath     "here lately" (04/28/2013)  . OSA on CPAP   . Arthritis     "fingers; knees; right shoulder" (04/28/2013)  . Gout   . Post traumatic stress disorder (PTSD)     Past Surgical History  Procedure Laterality Date  . Joint replacement    . Shoulder open rotator cuff repair Bilateral 2007-2008    "twice on each side"  . Robot assisted laparoscopic radical prostatectomy  2008  . Penile prosthesis  placement  12/2010  . Penile prosthesis implant  04/05/2011    Procedure: PENILE PROTHESIS INFLATABLE;  Surgeon: Claybon Jabs, MD;  Location: [facility];  Service: Urology;  Laterality: N/A;   EXPLORATION AND REVISION OF INFLATABLE PENILE   PROSTHESIS    PUMP  . Penile prosthesis implant  10/21/2011    Procedure: PENILE PROTHESIS INFLATABLE;  Surgeon: Claybon Jabs, MD;  Location: [facility];  Service: Urology;  Laterality: N/A;     . Tonsillectomy    . Inguinal hernia repair Right 1970's  . Total knee arthroplasty Bilateral 2011-2012    lt knee-2012  / rt knee -2011    History reviewed. No pertinent family history. Social History:  reports that he quit smoking about 42 years ago. His smoking use included Cigarettes. He has a .48 pack-year smoking history. He has never used smokeless tobacco. He reports that he drinks about 0.6 ounces of alcohol per week. He reports that he uses illicit drugs (Marijuana).  Allergies: No Known Allergies  Medications Prior to Admission  Medication Sig Dispense Refill  . allopurinol (ZYLOPRIM) 100 MG tablet Take 100 mg by mouth daily.      . ARIPiprazole (ABILIFY) 30 MG tablet Take 30 mg by mouth daily.      Marland Kitchen ascorbic acid (VITAMIN C) 500 MG tablet Take 500 mg by mouth 2 (two) times daily.      Marland Kitchen  aspirin 81 MG chewable tablet Chew 162 mg by mouth at bedtime.      Marland Kitchen co-enzyme Q-10 30 MG capsule Take 30 mg by mouth daily.       Marland Kitchen gabapentin (NEURONTIN) 600 MG tablet Take 1,200 mg by mouth 3 (three) times daily.      Marland Kitchen galantamine (RAZADYNE ER) 8 MG 24 hr capsule Take 8 mg by mouth daily with breakfast.      . mirabegron ER (MYRBETRIQ) 25 MG TB24 tablet Take 25 mg by mouth daily.      . Multiple Vitamin (MULITIVITAMIN WITH MINERALS) TABS Take 1 tablet by mouth daily.      Marland Kitchen omega-3 acid ethyl esters (LOVAZA) 1 G capsule Take 1 g by mouth at bedtime.      Marland Kitchen oxybutynin (DITROPAN-XL) 5 MG 24 hr tablet Take 5 mg by mouth daily.       . polyvinyl alcohol (LIQUIFILM TEARS) 1.4 % ophthalmic solution Place into both eyes 2 (two) times daily as needed for dry eyes.      . simvastatin (ZOCOR) 40 MG tablet Take 40 mg by mouth daily.      Marland Kitchen triamterene-hydrochlorothiazide (MAXZIDE) 75-50 MG per tablet Take 0.5 tablets by mouth 2 (two) times daily.      . [DISCONTINUED] calcium-vitamin D (OSCAL WITH D) 500-200 MG-UNIT per tablet Take 1 tablet by mouth daily.      . [DISCONTINUED] Phentermine-Topiramate (QSYMIA) 7.5-46 MG CP24 Take 1 capsule by mouth daily. Take 1 capsule by mouth daily start day 15 after 14 day course.      . [DISCONTINUED] simvastatin (ZOCOR) 20 MG tablet Take 40 mg by mouth at bedtime.           Review of Systems -   Blood pressure 126/78, pulse 96, temperature 98.1 F (36.7 C), temperature source Oral, resp. rate 20, SpO2 96.00%. General: Well built and Moderately obese body habitus who is in no acute distress. Appears stated age. Alert Ox3.   There is no cyanosis. HEENT: normal limits. PERRLA, No JVD.   CARDIAC EXAM: S1, S2 normal, no gallop present. No murmur.   CHEST EXAM: No tenderness of chest wall. LUNGS: Clear to percuss and auscultate.  ABDOMEN: Pannus present. No obvious hepatosplenomegaly. BS normal in all 4 quadrants. Abdomen is non-tender.   EXTREMITY: Full range of movementes, No edema. MUSCULOSKELETAL EXAM: Intact with full range of motion in all 4 extremities.   NEUROLOGIC EXAM: Grossly intact without any focal deficits. Alert O x 3.   VASCULAR EXAM: No skin breakdown. Carotids normal. Extremities: Femoral pulse distant and feeble without bruit. Popliteal pulse not felt due to bodily habitus; Pedal pulse Otherwise No prominent pulse felt in the abdomen. No varicose veins.  Results for orders placed during the hospital encounter of 04/28/13 (from the past 48 hour(s))  TROPONIN I     Status: None   Collection Time    04/28/13  8:06 PM      Result Value Range   Troponin  I <0.30  <0.30 ng/mL   Comment:            Due to the release kinetics of cTnI,     a negative result within the first hours     of the onset of symptoms does not rule out     myocardial infarction with certainty.     If myocardial infarction is still suspected,     repeat the test at appropriate intervals.  COMPREHENSIVE METABOLIC PANEL  Status: Abnormal   Collection Time    04/28/13  8:06 PM      Result Value Range   Sodium 140  137 - 147 mEq/L   Potassium 3.8  3.7 - 5.3 mEq/L   Chloride 95 (*) 96 - 112 mEq/L   CO2 29  19 - 32 mEq/L   Glucose, Bld 98  70 - 99 mg/dL   BUN 15  6 - 23 mg/dL   Creatinine, Ser 1.34  0.50 - 1.35 mg/dL   Calcium 10.0  8.4 - 10.5 mg/dL   Total Protein 7.5  6.0 - 8.3 g/dL   Albumin 3.9  3.5 - 5.2 g/dL   AST 18  0 - 37 U/L   ALT 20  0 - 53 U/L   Alkaline Phosphatase 65  39 - 117 U/L   Total Bilirubin 1.0  0.3 - 1.2 mg/dL   GFR calc non Af Amer 54 (*) >90 mL/min   GFR calc Af Amer 63 (*) >90 mL/min   Comment: (NOTE)     The eGFR has been calculated using the CKD EPI equation.     This calculation has not been validated in all clinical situations.     eGFR's persistently <90 mL/min signify possible Chronic Kidney     Disease.  PROTIME-INR     Status: None   Collection Time    04/28/13  8:06 PM      Result Value Range   Prothrombin Time 14.9  11.6 - 15.2 seconds   INR 1.20  0.00 - 1.49  CBC WITH DIFFERENTIAL     Status: Abnormal   Collection Time    04/28/13  8:06 PM      Result Value Range   WBC 7.1  4.0 - 10.5 K/uL   RBC 6.01 (*) 4.22 - 5.81 MIL/uL   Hemoglobin 15.9  13.0 - 17.0 g/dL   HCT 46.4  39.0 - 52.0 %   MCV 77.2 (*) 78.0 - 100.0 fL   MCH 26.5  26.0 - 34.0 pg   MCHC 34.3  30.0 - 36.0 g/dL   RDW 14.4  11.5 - 15.5 %   Platelets 124 (*) 150 - 400 K/uL   Neutrophils Relative % 68  43 - 77 %   Neutro Abs 4.8  1.7 - 7.7 K/uL   Lymphocytes Relative 22  12 - 46 %   Lymphs Abs 1.5  0.7 - 4.0 K/uL   Monocytes Relative 7  3 - 12 %    Monocytes Absolute 0.5  0.1 - 1.0 K/uL   Eosinophils Relative 3  0 - 5 %   Eosinophils Absolute 0.2  0.0 - 0.7 K/uL   Basophils Relative 0  0 - 1 %   Basophils Absolute 0.0  0.0 - 0.1 K/uL   No results found.  Labs:   Lab Results  Component Value Date   WBC 7.1 04/28/2013   HGB 15.9 04/28/2013   HCT 46.4 04/28/2013   MCV 77.2* 04/28/2013   PLT 124* 04/28/2013    Recent Labs Lab 04/28/13 2006  NA 140  K 3.8  CL 95*  CO2 29  BUN 15  CREATININE 1.34  CALCIUM 10.0  PROT 7.5  BILITOT 1.0  ALKPHOS 65  ALT 20  AST 18  GLUCOSE 98   Lab Results  Component Value Date   TROPONINI <0.30 04/28/2013    EKG:  EKG 1/70/2015: Normal sinus rhythm, nonspecific ST segment change noted in V1 to V3 with nonspecific  T-wave inversion in anterior leads, cannot exclude ischemia. PACs. No reciprocal changes to suggest acute myocardial infarction. No significant change from prior EKG that was obtained earlier this morning. Minimal ST elevation in the anterior leads V1-2 does not meet criteria for STEMI. ST-T changes not evident on prior EKG that was performed on 07/20/2012.  Assessment/Plan 1. Angina pectoris, crescendo (411.1), Unstable angina pectoris with dynamic EKG changes and suspect LAD lesion.  2. Hyperlipemia (272.4)  3. Obesity (BMI 35.0-39.9 without comorbidity) (278.00)  4. 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 CMP otherwise normal. CBC normal. Total cholesterol 167, triglycerides 86, HDL 44, LDL 106. TSH was normal. Hemoglobin A1c was 6.0%.   Recommendation: Mechanism of underlying disease process and action of medications discussed with the patient. I discussed primary/secondary prevention and also dietary counceling was done. Patient presents to me for evaluation of abnormal EKG and chest pain  which is classic for angina pectoris. Minimal activities is causing to have chest pain and I'm very concerned that with EKG abnormalities that he is having active unstable angina pectoris.  I also obtained a stat serum troponin I, which was elevated at 0.27 (abnormal), but arrangements were already made for patient to be admitted, patient's wife is accompanying the patient personally to the hospital and I have advised them clearly not to walk and to ask for wheelchair support. Patient will be admitted as non-ST elevation myocardial infarction and will be scheduled for coronary angiography in the morning. However admission S. Troponin negative.   Schedule for cardiac catheterization, and possible angioplasty. We discussed regarding risks, benefits, alternatives to this including stress testing, CTA and continued medical therapy. Patient wants to proceed. Understands <1-2% risk of death, stroke, MI, urgent CABG, bleeding, infection, 2-3% risk of renal failure but not limited to these. Video recording of the procedure shown to the patient   Laverda Page, MD 04/28/2013, 9:19 PM [facility] [place] Pager: [phone] Office: [phone] If no answer: Cell:  [phone]

## 2013-04-29 ENCOUNTER — Encounter (HOSPITAL_COMMUNITY)
Admission: AD | Disposition: A | Discharge status: Home / Self Care | Payer: Self-pay | Source: Ambulatory Visit | Attending: Internal Medicine

## 2013-04-29 ENCOUNTER — Ambulatory Visit (HOSPITAL_COMMUNITY): Admit: 2013-04-29 | Payer: Self-pay | Admitting: Cardiology

## 2013-04-29 HISTORY — PX: LEFT HEART CATHETERIZATION WITH CORONARY ANGIOGRAM: SHX5451

## 2013-04-29 LAB — CBC
HCT: 42.4 % (ref 39.0–52.0)
Hemoglobin: 14.7 g/dL (ref 13.0–17.0)
MCH: 26.6 pg (ref 26.0–34.0)
MCHC: 34.7 g/dL (ref 30.0–36.0)
MCV: 76.7 fL — ABNORMAL LOW (ref 78.0–100.0)
PLATELETS: 125 10*3/uL — AB (ref 150–400)
RBC: 5.53 MIL/uL (ref 4.22–5.81)
RDW: 14.3 % (ref 11.5–15.5)
WBC: 7 10*3/uL (ref 4.0–10.5)

## 2013-04-29 LAB — GLUCOSE, CAPILLARY: Glucose-Capillary: 140 mg/dL — ABNORMAL HIGH (ref 70–99)

## 2013-04-29 LAB — TROPONIN I: Troponin I: <0.3 ng/mL (ref <0.30)

## 2013-04-29 LAB — HEPARIN LEVEL (UNFRACTIONATED): HEPARIN UNFRACTIONATED: 0.2 [IU]/mL — AB (ref 0.30–0.70)

## 2013-04-29 SURGERY — LEFT HEART CATHETERIZATION WITH CORONARY ANGIOGRAM
Anesthesia: LOCAL

## 2013-04-29 MED ORDER — SIMVASTATIN 20 MG PO TABS: 20.0000 mg | ORAL_TABLET | Freq: Every day | ORAL | Status: DC

## 2013-04-29 MED ORDER — ONDANSETRON HCL 4 MG/2ML IJ SOLN: 4.0000 mg | Freq: Four times a day (QID) | INTRAMUSCULAR | Status: DC | PRN

## 2013-04-29 MED ORDER — HYDROCODONE-ACETAMINOPHEN 5-500 MG PO TABS: 1.0000 | ORAL_TABLET | Freq: Four times a day (QID) | ORAL | Status: DC | PRN

## 2013-04-29 MED ORDER — HEPARIN (PORCINE) IN NACL 2-0.9 UNIT/ML-% IJ SOLN
INTRAMUSCULAR | Status: AC
  Filled 2013-04-29: qty 1500

## 2013-04-29 MED ORDER — SERTRALINE HCL 100 MG PO TABS: 100.0000 mg | ORAL_TABLET | Freq: Two times a day (BID) | ORAL | Status: DC

## 2013-04-29 MED ORDER — HEPARIN SODIUM (PORCINE) 1000 UNIT/ML IJ SOLN
INTRAMUSCULAR | Status: AC
  Filled 2013-04-29: qty 1

## 2013-04-29 MED ORDER — MIDAZOLAM HCL 2 MG/2ML IJ SOLN
INTRAMUSCULAR | Status: AC
  Filled 2013-04-29: qty 2

## 2013-04-29 MED ORDER — VERAPAMIL HCL 2.5 MG/ML IV SOLN
INTRAVENOUS | Status: AC
  Filled 2013-04-29: qty 2

## 2013-04-29 MED ORDER — SODIUM CHLORIDE 0.9 % IJ SOLN: 3.0000 mL | INTRAMUSCULAR | Status: DC | PRN

## 2013-04-29 MED ORDER — SODIUM CHLORIDE 0.9 % IV SOLN: INTRAVENOUS | Status: AC

## 2013-04-29 MED ORDER — SODIUM CHLORIDE 0.9 % IV SOLN: 250.0000 mL | INTRAVENOUS | Status: DC | PRN

## 2013-04-29 MED ORDER — SODIUM CHLORIDE 0.9 % IJ SOLN: 3.0000 mL | Freq: Two times a day (BID) | INTRAMUSCULAR | Status: DC

## 2013-04-29 MED ORDER — SODIUM CHLORIDE 0.9 % IV SOLN
INTRAVENOUS | Status: DC
  Administered 2013-04-29: via INTRAVENOUS

## 2013-04-29 MED ORDER — CALCIUM CARBONATE-VITAMIN D 500-200 MG-UNIT PO TABS: 1.0000 | ORAL_TABLET | Freq: Every day | ORAL | Status: DC

## 2013-04-29 MED ORDER — PHENTERMINE-TOPIRAMATE ER 7.5-46 MG PO CP24: 1.0000 | ORAL_CAPSULE | Freq: Every day | ORAL | Status: DC

## 2013-04-29 MED ORDER — ACETAMINOPHEN 325 MG PO TABS: 650.0000 mg | ORAL_TABLET | ORAL | Status: DC | PRN

## 2013-04-29 MED ORDER — NITROGLYCERIN 0.2 MG/ML ON CALL CATH LAB
INTRAVENOUS | Status: AC
  Filled 2013-04-29: qty 1

## 2013-04-29 MED ORDER — CARVEDILOL 6.25 MG PO TABS: 6.2500 mg | ORAL_TABLET | Freq: Two times a day (BID) | ORAL | Status: DC

## 2013-04-29 MED ORDER — HYDROMORPHONE HCL PF 2 MG/ML IJ SOLN
INTRAMUSCULAR | Status: AC
  Filled 2013-04-29: qty 1

## 2013-04-29 MED ORDER — LIDOCAINE HCL (PF) 1 % IJ SOLN
INTRAMUSCULAR | Status: AC
  Filled 2013-04-29: qty 30

## 2013-04-29 MED ORDER — GALANTAMINE HYDROBROMIDE 4 MG PO TABS
8.0000 mg | ORAL_TABLET | Freq: Two times a day (BID) | ORAL | Status: DC
  Filled 2013-04-29 (×3): qty 2

## 2013-04-29 NOTE — Progress Notes (Signed)
ANTICOAGULATION CONSULT NOTE - Follow Up Consult  Pharmacy Consult for heparin Indication: USAP  Labs:  Recent Labs  04/28/13 2006 04/29/13 0120  HGB 15.9 14.7  HCT 46.4 42.4  PLT 124* 125*  LABPROT 14.9  --   INR 1.20  --   HEPARINUNFRC  --  0.20*  CREATININE 1.34  --   TROPONINI <0.30  --     Assessment: 67yo male subtherapeutic on heparin with initial dosing for CP; lab drawn early.  Goal of Therapy:  Heparin level 0.3-0.7 units/ml   Plan:  Will increase heparin gtt by 3 units/kg/hr to 1600 units/hr and check level in 6hr.  Wynona Neat, PharmD, BCPS  04/29/2013,1:57 AM

## 2013-04-29 NOTE — Interval H&P Note (Signed)
History and Physical Interval Note:  04/29/2013 6:35 AM  Mathew Malone  has presented today for surgery, with the diagnosis of Chest pain  The various methods of treatment have been discussed with the patient and family. After consideration of risks, benefits and other options for treatment, the patient has consented to  Procedure(s): LEFT HEART CATHETERIZATION WITH CORONARY ANGIOGRAM (N/A) and possible PCI s a surgical intervention .  The patient's history has been reviewed, patient examined, no change in status, stable for surgery.  I have reviewed the patient's chart and labs.  Questions were answered to the patient's satisfaction.   Cath Lab Visit (complete for each Cath Lab visit)  Clinical Evaluation Leading to the Procedure:   ACS: yes  Non-ACS:    Anginal Classification: CCS IV  Anti-ischemic medical therapy: No Therapy  Non-Invasive Test Results: No non-invasive testing performed  Prior CABG: No previous CABG         [facility]

## 2013-04-29 NOTE — CV Procedure (Signed)
Procedure performed:  Left heart catheterization including hemodynamic monitoring of the left ventricle, LV gram, selective right and left coronary arteriography.  Indication patient is a 68 year-old AA male with history of hypertension,  hyperlipidemia,  Diabetes Mellitus   who presents with chest pain with non specific ST elevation in the anterior leads that are new from prior EKG with T wave inversion in V1 to V3 with chest discomfort even on minimal exertion worse in the last 2 weeks. Acute coronary syndrome is suspected, patient was admitted directly to the hospital under telemetry, ruled out for myocardial infarction, brought to the coronary angiography suite to evaluate his coronary anatomy.   Hemodynamic data:  Left ventricular pressure was 108/4 with LVEDP of 10 mm mercury. Aortic pressure was 105/78 with a mean of 84 mm mercury. There was no pressure gradient across the aortic valve  Left ventricle: Performed in the RAO projection revealed LVEF of 60%. There was No MR. No wall motion abnormality.  Right coronary artery: The vessel is smooth, normal,  Dominant. Moderate sized PL and PDA branches, tortuosity evident.  Left main coronary artery is large and normal.  Circumflex coronary artery: A large vessel giving origin to a large obtuse marginal 1, which has secondary branch. AV nodal branch is moderate size. The vessels are tortuous. Otherwise no symptoms stenosis evident.  LAD:  LAD gives origin to  Moderate size D1 and D2 and several small anterolateral branches, LAD is tortuous. No symptoms stenosis evident.   Impression:  Slow flow was evident throughout the coronary vessels, findings are consistent with hypertensive heart disease. Patient will be discharged home today with outpatient followup. Continued primary prevention is indicated. A total of 55 cc of contrast was utilized for diagnostic angiography.  Technique: Under sterile precautions using a 6 French right radial   arterial access, a 6 French sheath was introduced into the right radial artery. A 5 [place] Tig 4 catheter was advanced into the ascending aorta selective  right coronary artery and left coronary artery was cannulated and angiography was performed in multiple views. The catheter was pulled back Out of the body over exchange length J-wire. Same Catheter was used to perform LV gram which was performed in RAO projection. Catheter exchanged out of the body over J-Wire. NO immediate complications noted. Patient tolerated the procedure well.   Disposition: Will be discharged home today with outpatient follow up.

## 2013-04-29 NOTE — Progress Notes (Signed)
The Chaplain offered emotional and spiritual support to the patient today. The patient was very positive in his interaction with the Chaplain in their visit together. The patient expressed to the Chaplain "he is very happy that he is being released from the hospital today." He also expressed to the Chaplain that "he had a short stay at the hospital and that he is grateful that his stay at the hospital was not long at all." The Chaplain prayed with the patient before they closed their visit out together.  Chaplain Clista Bernhardt Alecsander Hattabaugh

## 2013-04-29 NOTE — Discharge Summary (Signed)
Physician Discharge Summary  Patient ID: Mathew Malone MRN: [id number] DOB/AGE: 06/27/1945 68 y.o.  Admit date: 04/28/2013 Discharge date: 04/29/2013  Primary Discharge Diagnosis Chest pain due to subendocardial myocardial ischemia secondary to hypertension with hypertensive heart disease and endothelial dysfunction Secondary Discharge Diagnosis Diabetes mellitus type 2 controlled Chronic renal insufficiency due to diabetes and hypertension Obstructive sleep apnea on CPAP Moderate obesity Hyperlipidemia  Significant Diagnostic Studies: 04/29/2013: coronary angiography Left ventricular pressure was 108/4 with LVEDP of 10 mm mercury. Aortic pressure was 105/78 with a mean of 84 mm mercury. There was no pressure gradient across the aortic valve  Left ventricle: Performed in the RAO projection revealed LVEF of 60%. There was No MR. No wall motion abnormality.  Right coronary artery: The vessel is smooth, normal, Dominant. Moderate sized PL and PDA branches, tortuosity evident.  Left main coronary artery is large and normal.  Circumflex coronary artery: A large vessel giving origin to a large obtuse marginal 1, which has secondary branch. AV nodal branch is moderate size. The vessels are tortuous. Otherwise no symptoms stenosis evident.  LAD: LAD gives origin to Moderate size D1 and D2 and several small anterolateral branches, LAD is tortuous. No symptoms stenosis evident.   Consults:   Hospital Course:  Patient was admitted from the office to the hospital directly with EKG changes and with a suspicion for ACS. A stat serum troponin that was obtained in the outpatient setting was abnormal. Patient underwent coronary angiography the following morning on 04/29/2013:     Recommendations on discharge:  Slow flow was evident throughout the coronary vessels, findings are consistent with hypertensive heart disease. Patient will be discharged home today with outpatient followup. Continued primary prevention  is indicated. A total of 55 cc of contrast was utilized for diagnostic angiography. Suspect the chest pain is due to hypertensive heart disease and diastolic dysfunction. Patient will be discharged home on home medications, Coreg 6.25 mg by mouth twice a day was added.  Discharge Exam: Blood pressure 126/78, pulse 86, temperature 97.9 F (36.6 C), temperature source Oral, resp. rate 20, SpO2 98.00%.There is no cyanosis. HEENT: normal limits. PERRLA, No JVD.  CARDIAC EXAM: S1, S2 normal, no gallop present. No murmur.  CHEST EXAM: No tenderness of chest wall. LUNGS: Clear to percuss and auscultate.  ABDOMEN: Pannus present. No obvious hepatosplenomegaly. BS normal in all 4 quadrants. Abdomen is non-tender.  EXTREMITY: Full range of movementes, No edema. MUSCULOSKELETAL EXAM: Intact with full range of motion in all 4 extremities.  NEUROLOGIC EXAM: Grossly intact without any focal deficits. Alert O x 3.  VASCULAR EXAM: No skin breakdown. Carotids normal. Extremities: Femoral pulse distant and feeble without bruit. Popliteal pulse not felt due to bodily habitus; Pedal pulse Otherwise No prominent pulse felt in the abdomen. No varicose veins.    Labs:   Lab Results  Component Value Date   WBC 7.0 04/29/2013   HGB 14.7 04/29/2013   HCT 42.4 04/29/2013   MCV 76.7* 04/29/2013   PLT 125* 04/29/2013    Recent Labs Lab 04/28/13 2006  NA 140  K 3.8  CL 95*  CO2 29  BUN 15  CREATININE 1.34  CALCIUM 10.0  PROT 7.5  BILITOT 1.0  ALKPHOS 65  ALT 20  AST 18  GLUCOSE 98   Lab Results  Component Value Date   TROPONINI <0.30 04/29/2013    EKG: normal sinus rhythm at a rate of 80 beats a minute, normal intervals, nonspecific ST elevation in V1 to V3  with T wave inversion. Unchanged from previous tracings.  FOLLOW UP PLANS AND APPOINTMENTS    Medication List         allopurinol 100 MG tablet  Commonly known as:  ZYLOPRIM  Take 100 mg by mouth daily.     ARIPiprazole 30 MG tablet  Commonly known  as:  ABILIFY  Take 30 mg by mouth daily.     ascorbic acid 500 MG tablet  Commonly known as:  VITAMIN C  Take 500 mg by mouth 2 (two) times daily.     aspirin 81 MG chewable tablet  Chew 162 mg by mouth at bedtime.     calcium-vitamin D 500-200 MG-UNIT per tablet  Commonly known as:  OSCAL WITH D  Take 1 tablet by mouth daily.     carvedilol 6.25 MG tablet  Commonly known as:  COREG  Take 1 tablet (6.25 mg total) by mouth 2 (two) times daily with a meal.     co-enzyme Q-10 30 MG capsule  Take 30 mg by mouth daily.     gabapentin 600 MG tablet  Commonly known as:  NEURONTIN  Take 1,200 mg by mouth 3 (three) times daily.     galantamine 8 MG 24 hr capsule  Commonly known as:  RAZADYNE ER  Take 8 mg by mouth daily with breakfast.     HYDROcodone-acetaminophen 5-500 MG per tablet  Commonly known as:  VICODIN  Take 1 tablet by mouth every 6 (six) hours as needed. For pain     mirabegron ER 25 MG Tb24 tablet  Commonly known as:  MYRBETRIQ  Take 25 mg by mouth daily.     multivitamin with minerals Tabs tablet  Take 1 tablet by mouth daily.     omega-3 acid ethyl esters 1 G capsule  Commonly known as:  LOVAZA  Take 1 g by mouth at bedtime.     oxybutynin 5 MG 24 hr tablet  Commonly known as:  DITROPAN-XL  Take 5 mg by mouth daily.     Phentermine-Topiramate 7.5-46 MG Cp24  Commonly known as:  QSYMIA  Take 1 capsule by mouth daily. Take 1 capsule by mouth daily start day 15 after 14 day course.     polyvinyl alcohol 1.4 % ophthalmic solution  Commonly known as:  LIQUIFILM TEARS  Place into both eyes 2 (two) times daily as needed for dry eyes.     sertraline 100 MG tablet  Commonly known as:  ZOLOFT  Take 1 tablet (100 mg total) by mouth 2 (two) times daily.     simvastatin 20 MG tablet  Commonly known as:  ZOCOR  Take 1 tablet (20 mg total) by mouth at bedtime.     triamterene-hydrochlorothiazide 75-50 MG per tablet  Commonly known as:  MAXZIDE  Take 0.5  tablets by mouth 2 (two) times daily.           Follow-up Information   Call Laverda Page, MD. (To be seen in 2-3 weeks)    Specialty:  Cardiology   Contact information:   [address] [place] [address] [phone]        Laverda Page, MD 04/29/2013, 8:27 AM  Pager: [phone] Office: [phone] If no answer: [phone]

## 2013-04-29 NOTE — Progress Notes (Signed)
UR Completed Tamela Elsayed Graves-Bigelow, RN,BSN [phone]  

## 2013-04-29 NOTE — Discharge Instructions (Signed)
Groin Site Care °Refer to this sheet in the next few weeks. These instructions provide you with information on caring for yourself after your procedure. Your caregiver may also give you more specific instructions. Your treatment has been planned according to current medical practices, but problems sometimes occur. Call your caregiver if you have any problems or questions after your procedure. °HOME CARE INSTRUCTIONS °· You may shower 24 hours after the procedure. Remove the bandage (dressing) and gently wash the site with plain soap and water. Gently pat the site dry. °· Do not apply powder or lotion to the site. °· Do not sit in a bathtub, swimming pool, or whirlpool for 5 to 7 days. °· No bending, squatting, or lifting anything over 10 pounds (4.5 kg) as directed by your caregiver. °· Inspect the site at least twice daily. °· Do not drive home if you are discharged the same day of the procedure. Have someone else drive you. °· You may drive 24 hours after the procedure unless otherwise instructed by your caregiver. °What to expect: °· Any bruising will usually fade within 1 to 2 weeks. °· Blood that collects in the tissue (hematoma) may be painful to the touch. It should usually decrease in size and tenderness within 1 to 2 weeks. °SEEK IMMEDIATE MEDICAL CARE IF: °· You have unusual pain at the groin site or down the affected leg. °· You have redness, warmth, swelling, or pain at the groin site. °· You have drainage (other than a small amount of blood on the dressing). °· You have chills. °· You have a fever or persistent symptoms for more than 72 hours. °· You have a fever and your symptoms suddenly get worse. °· Your leg becomes pale, cool, tingly, or numb. °· You have heavy bleeding from the site. Hold pressure on the site. °Document Released: 05/11/2010 Document Revised: 07/01/2011 Document Reviewed: 05/11/2010 °[employer]® Patient Information ©2014 [employer]. ° °

## 2014-03-31 ENCOUNTER — Encounter (HOSPITAL_COMMUNITY): Payer: Self-pay | Admitting: Cardiology

## 2020-06-25 ENCOUNTER — Inpatient Hospital Stay (HOSPITAL_COMMUNITY): Payer: Medicare Other

## 2020-06-25 ENCOUNTER — Other Ambulatory Visit: Payer: Self-pay

## 2020-06-25 ENCOUNTER — Emergency Department (HOSPITAL_COMMUNITY): Payer: Medicare Other

## 2020-06-25 ENCOUNTER — Encounter (HOSPITAL_COMMUNITY): Payer: Self-pay | Admitting: *Deleted

## 2020-06-25 ENCOUNTER — Inpatient Hospital Stay (HOSPITAL_COMMUNITY)
Admission: EM | Admit: 2020-06-25 | Discharge: 2020-07-01 | DRG: 064 | Disposition: A | Discharge status: Rehabilitation Facility | Payer: Medicare Other | Attending: Neurology | Admitting: Neurology

## 2020-06-25 DIAGNOSIS — R159 Full incontinence of feces: Secondary | ICD-10-CM | POA: Diagnosis present

## 2020-06-25 DIAGNOSIS — G4733 Obstructive sleep apnea (adult) (pediatric): Secondary | ICD-10-CM | POA: Diagnosis present

## 2020-06-25 DIAGNOSIS — E669 Obesity, unspecified: Secondary | ICD-10-CM | POA: Diagnosis present

## 2020-06-25 DIAGNOSIS — I6389 Other cerebral infarction: Secondary | ICD-10-CM

## 2020-06-25 DIAGNOSIS — Z79899 Other long term (current) drug therapy: Secondary | ICD-10-CM

## 2020-06-25 DIAGNOSIS — G9349 Other encephalopathy: Secondary | ICD-10-CM | POA: Diagnosis present

## 2020-06-25 DIAGNOSIS — I10 Essential (primary) hypertension: Secondary | ICD-10-CM | POA: Diagnosis present

## 2020-06-25 DIAGNOSIS — I493 Ventricular premature depolarization: Secondary | ICD-10-CM | POA: Diagnosis present

## 2020-06-25 DIAGNOSIS — H9193 Unspecified hearing loss, bilateral: Secondary | ICD-10-CM | POA: Diagnosis present

## 2020-06-25 DIAGNOSIS — I161 Hypertensive emergency: Secondary | ICD-10-CM | POA: Diagnosis present

## 2020-06-25 DIAGNOSIS — E785 Hyperlipidemia, unspecified: Secondary | ICD-10-CM | POA: Diagnosis present

## 2020-06-25 DIAGNOSIS — Z20822 Contact with and (suspected) exposure to covid-19: Secondary | ICD-10-CM | POA: Diagnosis present

## 2020-06-25 DIAGNOSIS — G9389 Other specified disorders of brain: Secondary | ICD-10-CM | POA: Diagnosis not present

## 2020-06-25 DIAGNOSIS — I639 Cerebral infarction, unspecified: Secondary | ICD-10-CM

## 2020-06-25 DIAGNOSIS — G40409 Other generalized epilepsy and epileptic syndromes, not intractable, without status epilepticus: Secondary | ICD-10-CM | POA: Diagnosis present

## 2020-06-25 DIAGNOSIS — G8194 Hemiplegia, unspecified affecting left nondominant side: Secondary | ICD-10-CM | POA: Diagnosis present

## 2020-06-25 DIAGNOSIS — F4310 Post-traumatic stress disorder, unspecified: Secondary | ICD-10-CM | POA: Diagnosis present

## 2020-06-25 DIAGNOSIS — R739 Hyperglycemia, unspecified: Secondary | ICD-10-CM | POA: Diagnosis present

## 2020-06-25 DIAGNOSIS — R4701 Aphasia: Secondary | ICD-10-CM | POA: Diagnosis present

## 2020-06-25 DIAGNOSIS — Y92009 Unspecified place in unspecified non-institutional (private) residence as the place of occurrence of the external cause: Secondary | ICD-10-CM | POA: Diagnosis not present

## 2020-06-25 DIAGNOSIS — W19XXXA Unspecified fall, initial encounter: Secondary | ICD-10-CM | POA: Diagnosis present

## 2020-06-25 DIAGNOSIS — R400 Somnolence: Secondary | ICD-10-CM | POA: Diagnosis present

## 2020-06-25 DIAGNOSIS — I611 Nontraumatic intracerebral hemorrhage in hemisphere, cortical: Secondary | ICD-10-CM | POA: Diagnosis present

## 2020-06-25 DIAGNOSIS — Z781 Physical restraint status: Secondary | ICD-10-CM

## 2020-06-25 DIAGNOSIS — Z87891 Personal history of nicotine dependence: Secondary | ICD-10-CM | POA: Diagnosis not present

## 2020-06-25 DIAGNOSIS — I619 Nontraumatic intracerebral hemorrhage, unspecified: Secondary | ICD-10-CM | POA: Diagnosis present

## 2020-06-25 DIAGNOSIS — R4587 Impulsiveness: Secondary | ICD-10-CM | POA: Diagnosis present

## 2020-06-25 DIAGNOSIS — G936 Cerebral edema: Secondary | ICD-10-CM | POA: Diagnosis present

## 2020-06-25 DIAGNOSIS — E876 Hypokalemia: Secondary | ICD-10-CM | POA: Diagnosis present

## 2020-06-25 DIAGNOSIS — R29730 NIHSS score 30: Secondary | ICD-10-CM | POA: Diagnosis present

## 2020-06-25 DIAGNOSIS — R410 Disorientation, unspecified: Secondary | ICD-10-CM | POA: Diagnosis not present

## 2020-06-25 DIAGNOSIS — Z8546 Personal history of malignant neoplasm of prostate: Secondary | ICD-10-CM

## 2020-06-25 DIAGNOSIS — D696 Thrombocytopenia, unspecified: Secondary | ICD-10-CM | POA: Diagnosis present

## 2020-06-25 DIAGNOSIS — Z7982 Long term (current) use of aspirin: Secondary | ICD-10-CM

## 2020-06-25 DIAGNOSIS — Z6831 Body mass index (BMI) 31.0-31.9, adult: Secondary | ICD-10-CM

## 2020-06-25 DIAGNOSIS — Z96653 Presence of artificial knee joint, bilateral: Secondary | ICD-10-CM | POA: Diagnosis present

## 2020-06-25 DIAGNOSIS — R4189 Other symptoms and signs involving cognitive functions and awareness: Secondary | ICD-10-CM | POA: Diagnosis present

## 2020-06-25 DIAGNOSIS — R569 Unspecified convulsions: Secondary | ICD-10-CM

## 2020-06-25 DIAGNOSIS — Z9079 Acquired absence of other genital organ(s): Secondary | ICD-10-CM

## 2020-06-25 LAB — DIFFERENTIAL
Abs Immature Granulocytes: 0.04 10*3/uL (ref 0.00–0.07)
Basophils Absolute: 0 10*3/uL (ref 0.0–0.1)
Basophils Relative: 0 %
Eosinophils Absolute: 0.2 10*3/uL (ref 0.0–0.5)
Eosinophils Relative: 1 %
Immature Granulocytes: 0 %
Lymphocytes Relative: 20 %
Lymphs Abs: 2.8 10*3/uL (ref 0.7–4.0)
Monocytes Absolute: 0.7 10*3/uL (ref 0.1–1.0)
Monocytes Relative: 5 %
Neutro Abs: 10.3 10*3/uL — ABNORMAL HIGH (ref 1.7–7.7)
Neutrophils Relative %: 74 %

## 2020-06-25 LAB — PROTIME-INR
INR: 1 (ref 0.8–1.2)
Prothrombin Time: 12.7 seconds (ref 11.4–15.2)

## 2020-06-25 LAB — I-STAT CHEM 8, ED
BUN: 22 mg/dL (ref 8–23)
Calcium, Ion: 0.98 mmol/L — ABNORMAL LOW (ref 1.15–1.40)
Chloride: 100 mmol/L (ref 98–111)
Creatinine, Ser: 1 mg/dL (ref 0.61–1.24)
Glucose, Bld: 201 mg/dL — ABNORMAL HIGH (ref 70–99)
HCT: 53 % — ABNORMAL HIGH (ref 39.0–52.0)
Hemoglobin: 18 g/dL — ABNORMAL HIGH (ref 13.0–17.0)
Potassium: 3.1 mmol/L — ABNORMAL LOW (ref 3.5–5.1)
Sodium: 139 mmol/L (ref 135–145)
TCO2: 27 mmol/L (ref 22–32)

## 2020-06-25 LAB — CBC
HCT: 52.9 % — ABNORMAL HIGH (ref 39.0–52.0)
Hemoglobin: 17 g/dL (ref 13.0–17.0)
MCH: 28.8 pg (ref 26.0–34.0)
MCHC: 32.1 g/dL (ref 30.0–36.0)
MCV: 89.5 fL (ref 80.0–100.0)
Platelets: 145 10*3/uL — ABNORMAL LOW (ref 150–400)
RBC: 5.91 MIL/uL — ABNORMAL HIGH (ref 4.22–5.81)
RDW: 13.2 % (ref 11.5–15.5)
WBC: 14.1 10*3/uL — ABNORMAL HIGH (ref 4.0–10.5)
nRBC: 0 % (ref 0.0–0.2)

## 2020-06-25 LAB — ETHANOL: Alcohol, Ethyl (B): <10 mg/dL (ref <10)

## 2020-06-25 LAB — COMPREHENSIVE METABOLIC PANEL
ALT: 23 U/L (ref 0–44)
AST: 35 U/L (ref 15–41)
Albumin: 4.7 g/dL (ref 3.5–5.0)
Alkaline Phosphatase: 51 U/L (ref 38–126)
Anion gap: 18 — ABNORMAL HIGH (ref 5–15)
BUN: 15 mg/dL (ref 8–23)
CO2: 24 mmol/L (ref 22–32)
Calcium: 9.7 mg/dL (ref 8.9–10.3)
Chloride: 98 mmol/L (ref 98–111)
Creatinine, Ser: 1.12 mg/dL (ref 0.61–1.24)
GFR, Estimated: >60 mL/min (ref >60)
Glucose, Bld: 222 mg/dL — ABNORMAL HIGH (ref 70–99)
Potassium: 3 mmol/L — ABNORMAL LOW (ref 3.5–5.1)
Sodium: 140 mmol/L (ref 135–145)
Total Bilirubin: 1.1 mg/dL (ref 0.3–1.2)
Total Protein: 7.5 g/dL (ref 6.5–8.1)

## 2020-06-25 LAB — LIPID PANEL
Cholesterol: 141 mg/dL (ref 0–200)
HDL: 57 mg/dL (ref >40)
LDL Cholesterol: 76 mg/dL (ref 0–99)
Total CHOL/HDL Ratio: 2.5 RATIO
Triglycerides: 41 mg/dL (ref <150)
VLDL: 8 mg/dL (ref 0–40)

## 2020-06-25 LAB — ECHOCARDIOGRAM COMPLETE
AR max vel: 3.26 cm2
AV Area VTI: 3.11 cm2
AV Area mean vel: 2.53 cm2
AV Mean grad: 5 mmHg
AV Peak grad: 7.2 mmHg
Ao pk vel: 1.34 m/s
Area-P 1/2: 2.45 cm2
Height: 70 in
S' Lateral: 3.7 cm
Weight: 3460.34 oz

## 2020-06-25 LAB — RAPID URINE DRUG SCREEN, HOSP PERFORMED
Amphetamines: NOT DETECTED
Barbiturates: NOT DETECTED
Benzodiazepines: NOT DETECTED
Cocaine: NOT DETECTED
Opiates: NOT DETECTED
Tetrahydrocannabinol: POSITIVE — AB

## 2020-06-25 LAB — TYPE AND SCREEN
ABO/RH(D): A POS
Antibody Screen: NEGATIVE

## 2020-06-25 LAB — RESP PANEL BY RT-PCR (FLU A&B, COVID) ARPGX2
Influenza A by PCR: NEGATIVE
Influenza B by PCR: NEGATIVE
SARS Coronavirus 2 by RT PCR: NEGATIVE

## 2020-06-25 LAB — GLUCOSE, CAPILLARY
Glucose-Capillary: 125 mg/dL — ABNORMAL HIGH (ref 70–99)
Glucose-Capillary: 112 mg/dL — ABNORMAL HIGH (ref 70–99)
Glucose-Capillary: 129 mg/dL — ABNORMAL HIGH (ref 70–99)

## 2020-06-25 LAB — MRSA PCR SCREENING: MRSA by PCR: NEGATIVE

## 2020-06-25 LAB — APTT: aPTT: 24 seconds (ref 24–36)

## 2020-06-25 LAB — HEMOGLOBIN A1C
Hgb A1c MFr Bld: 5.6 % (ref 4.8–5.6)
Mean Plasma Glucose: 114.02 mg/dL

## 2020-06-25 LAB — MAGNESIUM: Magnesium: 1.9 mg/dL (ref 1.7–2.4)

## 2020-06-25 LAB — PHOSPHORUS: Phosphorus: 2.5 mg/dL (ref 2.5–4.6)

## 2020-06-25 MED ORDER — HALOPERIDOL LACTATE 5 MG/ML IJ SOLN
2.0000 mg | Freq: Once | INTRAMUSCULAR | Status: AC
  Administered 2020-06-25: 2 mg via INTRAVENOUS
  Filled 2020-06-25: qty 1

## 2020-06-25 MED ORDER — PANTOPRAZOLE SODIUM 40 MG IV SOLR
40.0000 mg | Freq: Every day | INTRAVENOUS | Status: DC
  Administered 2020-06-25 – 2020-06-27 (×3): 40 mg via INTRAVENOUS
  Filled 2020-06-25 (×3): qty 40

## 2020-06-25 MED ORDER — ACETAMINOPHEN 650 MG RE SUPP: 650.0000 mg | RECTAL | Status: DC | PRN

## 2020-06-25 MED ORDER — VALPROATE SODIUM 100 MG/ML IV SOLN
4000.0000 mg | Freq: Once | INTRAVENOUS | Status: AC
  Administered 2020-06-25: 4000 mg via INTRAVENOUS
  Filled 2020-06-25: qty 40

## 2020-06-25 MED ORDER — SODIUM CHLORIDE 0.9% FLUSH
3.0000 mL | Freq: Once | INTRAVENOUS | Status: DC
Start: 2020-06-25 — End: 2020-07-01

## 2020-06-25 MED ORDER — ACETAMINOPHEN 160 MG/5ML PO SOLN: 650.0000 mg | ORAL | Status: DC | PRN

## 2020-06-25 MED ORDER — LEVETIRACETAM IN NACL 1500 MG/100ML IV SOLN
1500.0000 mg | Freq: Two times a day (BID) | INTRAVENOUS | Status: DC
  Administered 2020-06-25 – 2020-06-27 (×5): 1500 mg via INTRAVENOUS
  Filled 2020-06-25 (×5): qty 100

## 2020-06-25 MED ORDER — CHLORHEXIDINE GLUCONATE CLOTH 2 % EX PADS
6.0000 | MEDICATED_PAD | Freq: Every day | CUTANEOUS | Status: DC
  Administered 2020-06-25 – 2020-07-01 (×5): 6 via TOPICAL

## 2020-06-25 MED ORDER — CLEVIDIPINE BUTYRATE 0.5 MG/ML IV EMUL
0.0000 mg/h | INTRAVENOUS | Status: DC
  Administered 2020-06-25: 5 mg/h via INTRAVENOUS
  Administered 2020-06-25: 1 mg/h via INTRAVENOUS
  Administered 2020-06-26: 11 mg/h via INTRAVENOUS
  Administered 2020-06-26: 9 mg/h via INTRAVENOUS
  Administered 2020-06-26: 11 mg/h via INTRAVENOUS
  Filled 2020-06-25 (×2): qty 100
  Filled 2020-06-25 (×4): qty 50
  Filled 2020-06-25 (×2): qty 100
  Filled 2020-06-25: qty 50

## 2020-06-25 MED ORDER — DEXMEDETOMIDINE HCL IN NACL 400 MCG/100ML IV SOLN
0.2000 ug/kg/h | INTRAVENOUS | Status: DC
  Administered 2020-06-25: 1.2 ug/kg/h via INTRAVENOUS
  Administered 2020-06-25: 15:00:00 0.4 ug/kg/h via INTRAVENOUS
  Administered 2020-06-25: 20:00:00 1.2 ug/kg/h via INTRAVENOUS
  Administered 2020-06-26: 1 ug/kg/h via INTRAVENOUS
  Administered 2020-06-26: 0.3 ug/kg/h via INTRAVENOUS
  Administered 2020-06-26: 0.8 ug/kg/h via INTRAVENOUS
  Administered 2020-06-26: 1.2 ug/kg/h via INTRAVENOUS
  Administered 2020-06-27: 0.4 ug/kg/h via INTRAVENOUS
  Filled 2020-06-25 (×7): qty 100

## 2020-06-25 MED ORDER — LORAZEPAM 2 MG/ML IJ SOLN
3.0000 mg | Freq: Once | INTRAMUSCULAR | Status: AC
  Administered 2020-06-25: 3 mg via INTRAVENOUS
  Filled 2020-06-25: qty 2

## 2020-06-25 MED ORDER — SENNOSIDES-DOCUSATE SODIUM 8.6-50 MG PO TABS
1.0000 | ORAL_TABLET | Freq: Two times a day (BID) | ORAL | Status: DC
  Administered 2020-06-27 – 2020-07-01 (×8): 1 via ORAL
  Filled 2020-06-25 (×8): qty 1

## 2020-06-25 MED ORDER — SODIUM CHLORIDE 0.9 % IV SOLN: INTRAVENOUS | Status: DC

## 2020-06-25 MED ORDER — LORAZEPAM 2 MG/ML IJ SOLN: 1.0000 mg | INTRAMUSCULAR | Status: DC | PRN

## 2020-06-25 MED ORDER — CLEVIDIPINE BUTYRATE 0.5 MG/ML IV EMUL
INTRAVENOUS | Status: AC
  Administered 2020-06-25: 1 mg/h via INTRAVENOUS
  Filled 2020-06-25: qty 50

## 2020-06-25 MED ORDER — LORAZEPAM 2 MG/ML IJ SOLN
1.0000 mg | Freq: Once | INTRAMUSCULAR | Status: AC
  Administered 2020-06-25: 1 mg via INTRAVENOUS
  Filled 2020-06-25: qty 1

## 2020-06-25 MED ORDER — LORAZEPAM 2 MG/ML IJ SOLN
1.0000 mg | Freq: Once | INTRAMUSCULAR | Status: AC
  Administered 2020-06-26: 1 mg via INTRAVENOUS
  Filled 2020-06-25: qty 1

## 2020-06-25 MED ORDER — IOHEXOL 350 MG/ML SOLN
75.0000 mL | Freq: Once | INTRAVENOUS | Status: AC | PRN
  Administered 2020-06-25: 75 mL via INTRAVENOUS

## 2020-06-25 MED ORDER — SODIUM CHLORIDE 0.9 % IV SOLN
4000.0000 mg | Freq: Once | INTRAVENOUS | Status: AC
  Administered 2020-06-25: 4000 mg via INTRAVENOUS
  Filled 2020-06-25: qty 40

## 2020-06-25 MED ORDER — ACETAMINOPHEN 325 MG PO TABS: 650.0000 mg | ORAL_TABLET | ORAL | Status: DC | PRN

## 2020-06-25 MED ORDER — STROKE: EARLY STAGES OF RECOVERY BOOK
Freq: Once | Status: DC
  Filled 2020-06-25: qty 1

## 2020-06-25 NOTE — ED Triage Notes (Signed)
Patient presents to ed via [facility] states family heard patient getting up and them heard a thud, found patient unresp on floor, family did report to ems patient had seizure like activity, upon arrival to ed gaze to left , left side flaccid.

## 2020-06-25 NOTE — ED Notes (Signed)
Spoke with daugher [phone] [facility]  Update given .

## 2020-06-25 NOTE — Progress Notes (Signed)
SLP Cancellation Note  Patient Details Name: Mathew Malone MRN: [id number] DOB: Aug 28, 1945   Cancelled treatment:       Reason Eval/Treat Not Completed: Fatigue/lethargy limiting ability to participate (Pt's case discussed with Judson Roch, RN, who reported that the pt was very agitated this morning, but is now only responsive to pain. SLP will f/u on subsequent date.)  Shontelle Muska I. Hardin Negus, [place], [place] Office number [phone] Pager [phone]  Horton Marshall 06/25/2020, 9:47 AM

## 2020-06-25 NOTE — ED Notes (Signed)
Daughter Bryon Parker [phone] wants an update

## 2020-06-25 NOTE — ED Provider Notes (Signed)
Mathew Malone Provider Note   CSN: [id number] Arrival date & time: 06/25/20  5956  An emergency Malone physician performed an initial assessment on this suspected stroke patient at 0329.  History Chief Complaint  Patient presents with  . Code Stroke    Mathew Malone is a 75 y.o. male.  75 year old male with past medical history below who presents with weakness and aphasia.  EMS reports that the patient woke up at 3 AM and was walking then had a fall.  He was noted to have left-sided weakness, right gaze deviation, and aphasia thus EMS was called.  Code stroke called in route.  EMS has seen the patient move his left leg but left arm still weak. BP 387 systolic in route.   LEVEL 5 CAVEAT DUE TO AMS  The history is provided by the EMS personnel.       Past Medical History:  Diagnosis Date  . Arthritis    "fingers; knees; right shoulder" (04/28/2013)  . Cancer [facility]) 2008   prostate  . Exertional shortness of breath    "here lately" (04/28/2013)  . Gout   . HOH (hard of hearing)    bilateral  . Hyperlipidemia   . Hypertension    stress test- 2011  . OSA on CPAP   . Post traumatic stress disorder (PTSD)     Patient Active Problem List   Diagnosis Date Noted  . Unstable angina pectoris ([place]) 04/28/2013  . Malfunction of penile prosthesis ([place]) 10/21/2011    Past Surgical History:  Procedure Laterality Date  . INGUINAL HERNIA REPAIR Right 1970's  . JOINT REPLACEMENT    . LEFT HEART CATHETERIZATION WITH CORONARY ANGIOGRAM N/A 04/29/2013   Procedure: LEFT HEART CATHETERIZATION WITH CORONARY ANGIOGRAM;  Surgeon: Laverda Page, MD;  Location: [facility] CATH LAB;  Service: Cardiovascular;  Laterality: N/A;  . PENILE PROSTHESIS IMPLANT  04/05/2011   Procedure: PENILE PROTHESIS INFLATABLE;  Surgeon: Claybon Jabs, MD;  Location: [facility];  Service: Urology;  Laterality: N/A;   EXPLORATION AND REVISION OF INFLATABLE PENILE    PROSTHESIS    PUMP  . PENILE PROSTHESIS IMPLANT  10/21/2011   Procedure: PENILE PROTHESIS INFLATABLE;  Surgeon: Claybon Jabs, MD;  Location: [facility];  Service: Urology;  Laterality: N/A;     . PENILE PROSTHESIS PLACEMENT  12/2010  . ROBOT ASSISTED LAPAROSCOPIC RADICAL PROSTATECTOMY  2008  . SHOULDER OPEN ROTATOR CUFF REPAIR Bilateral 2007-2008   "twice on each side"  . TONSILLECTOMY    . TOTAL KNEE ARTHROPLASTY Bilateral 2011-2012   lt knee-2012  / rt knee -2011       No family history on file.  Social History   Tobacco Use  . Smoking status: Former Smoker    Packs/day: 0.12    Years: 4.00    Pack years: 0.48    Types: Cigarettes    Quit date: 04/04/1971    Years since quitting: 49.2  . Smokeless tobacco: Never Used  Substance Use Topics  . Alcohol use: Yes    Alcohol/week: 1.0 standard drink    Types: 1 Glasses of wine per week  . Drug use: Yes    Types: Marijuana    Comment: 04/28/2013 "quit smoking marijuana ~ 1974"    Home Medications Prior to Admission medications   Medication Sig Start Date End Date Taking? Authorizing Provider  allopurinol (ZYLOPRIM) 100 MG tablet Take 100 mg by mouth daily.    [provider]  ARIPiprazole (ABILIFY) 30 MG tablet Take 30 mg by mouth daily.    [provider]  ascorbic acid (VITAMIN C) 500 MG tablet Take 500 mg by mouth 2 (two) times daily.    [provider]  aspirin 81 MG chewable tablet Chew 162 mg by mouth at bedtime.    [provider]  calcium-vitamin D (OSCAL WITH D) 500-200 MG-UNIT per tablet Take 1 tablet by mouth daily. 04/29/13   Adrian Prows, MD  carvedilol (COREG) 6.25 MG tablet Take 1 tablet (6.25 mg total) by mouth 2 (two) times daily with a meal. 04/29/13   Adrian Prows, MD  co-enzyme Q-10 30 MG capsule Take 30 mg by mouth daily.     [provider]  gabapentin (NEURONTIN) 600 MG tablet Take 1,200 mg by mouth 3 (three) times daily.    [provider]   galantamine (RAZADYNE ER) 8 MG 24 hr capsule Take 8 mg by mouth daily with breakfast.    [provider]  HYDROcodone-acetaminophen (VICODIN) 5-500 MG per tablet Take 1 tablet by mouth every 6 (six) hours as needed. For pain 04/29/13   Adrian Prows, MD  mirabegron ER (MYRBETRIQ) 25 MG TB24 tablet Take 25 mg by mouth daily.    [provider]  Multiple Vitamin (MULITIVITAMIN WITH MINERALS) TABS Take 1 tablet by mouth daily.    [provider]  omega-3 acid ethyl esters (LOVAZA) 1 G capsule Take 1 g by mouth at bedtime.    [provider]  oxybutynin (DITROPAN-XL) 5 MG 24 hr tablet Take 5 mg by mouth daily.    [provider]  Phentermine-Topiramate (QSYMIA) 7.5-46 MG CP24 Take 1 capsule by mouth daily. Take 1 capsule by mouth daily start day 15 after 14 day course. 04/29/13   Adrian Prows, MD  polyvinyl alcohol (LIQUIFILM TEARS) 1.4 % ophthalmic solution Place into both eyes 2 (two) times daily as needed for dry eyes.    [provider]  sertraline (ZOLOFT) 100 MG tablet Take 1 tablet (100 mg total) by mouth 2 (two) times daily. 04/29/13   Adrian Prows, MD  simvastatin (ZOCOR) 20 MG tablet Take 1 tablet (20 mg total) by mouth at bedtime. 04/29/13   Adrian Prows, MD  triamterene-hydrochlorothiazide (MAXZIDE) 75-50 MG per tablet Take 0.5 tablets by mouth 2 (two) times daily.    [provider]    Allergies    Patient has no known allergies.  Review of Systems   Review of Systems  Unable to perform ROS: Mental status change    Physical Exam Updated Vital Signs BP 131/82   Pulse 91   Resp (!) 23   Ht _0  (1.778 m)   Wt 98.1 kg   SpO2 91%   BMI 31.03 kg/m   Physical Exam Vitals and nursing note reviewed.  Constitutional:      General: He is not in acute distress.    Appearance: He is ill-appearing.  HENT:     Head: Normocephalic and atraumatic.     Mouth/Throat:     Comments: Lacerations on tongue from biting Eyes:      Conjunctiva/sclera: Conjunctivae normal.     Pupils: Pupils are equal, round, and reactive to light.     Comments: Rightward gaze preference  Cardiovascular:     Rate and Rhythm: Normal rate and regular rhythm.     Heart sounds: Normal heart sounds. No murmur heard.   Pulmonary:     Effort: Pulmonary effort is normal.  Breath sounds: Normal breath sounds.  Abdominal:     General: Abdomen is flat. Bowel sounds are normal. There is no distension.     Palpations: Abdomen is soft.     Tenderness: There is no abdominal tenderness.  Musculoskeletal:     Right lower leg: No edema.     Left lower leg: No edema.  Skin:    General: Skin is warm and dry.  Neurological:     Mental Status: He is alert.     Comments: Non-verbal, difficulty following commands, L arm weakness, mild L leg weakness but moving leg spontaneously; 2+ patellar DTRs b/l, no clonus  Psychiatric:     Comments: agitated     ED Results / Procedures / Treatments   Labs (all labs ordered are listed, but only abnormal results are displayed) Labs Reviewed  CBC - Abnormal; Notable for the following components:      Result Value   WBC 14.1 (*)    RBC 5.91 (*)    HCT 52.9 (*)    Platelets 145 (*)    All other components within normal limits  DIFFERENTIAL - Abnormal; Notable for the following components:   Neutro Abs 10.3 (*)    All other components within normal limits  COMPREHENSIVE METABOLIC PANEL - Abnormal; Notable for the following components:   Potassium 3.0 (*)    Glucose, Bld 222 (*)    Anion gap 18 (*)    All other components within normal limits  I-STAT CHEM 8, ED - Abnormal; Notable for the following components:   Potassium 3.1 (*)    Glucose, Bld 201 (*)    Calcium, Ion 0.98 (*)    Hemoglobin 18.0 (*)    HCT 53.0 (*)    All other components within normal limits  PROTIME-INR  APTT  CBG MONITORING, ED    EKG None  Radiology CT HEAD CODE STROKE WO CONTRAST  Result Date: 06/25/2020 CLINICAL  DATA:  Code stroke. Initial evaluation for acute left-sided weakness. EXAM: CT HEAD WITHOUT CONTRAST TECHNIQUE: Contiguous axial images were obtained from the base of the skull through the vertex without intravenous contrast. COMPARISON:  None. FINDINGS: Brain: Examination degraded by motion artifact. 2.9 x 2.7 x 1.3 cm acute intraparenchymal hemorrhage seen positioned at the posterior right temporal lobe (estimated volume 5 cc). Mild surrounding vasogenic edema without significant regional mass effect. No other visible acute intracranial hemorrhage. No other acute large vessel territory infarct. No mass lesion or midline shift. No hydrocephalus or extra-axial fluid collection. Underlying moderate to advanced chronic microvascular ischemic disease. Vascular: Calcified atherosclerosis at the skull base. No asymmetric hyperdense vessel. Skull: No scalp soft tissue abnormality.  Calvarium intact. Sinuses/Orbits: Globes and orbital soft tissues within normal limits. Paranasal sinuses are clear. Trace left mastoid effusion noted, of doubtful significance. Other: None. ASPECTS [facility] Stroke Program Early CT Score) Does not apply given acute intracranial hemorrhage. IMPRESSION: 1. 2.9 x 2.7 x 1.3 cm acute intraparenchymal hemorrhage centered at the posterior right temporal lobe (estimated volume 5 cc). Mild localized edema without significant regional mass effect. 2. Underlying moderate to advanced chronic microvascular ischemic disease. Critical Value/emergent results were called by telephone at the time of interpretation on 06/25/2020 at 3:50 am to provider Dr. Theda Sers, who verbally acknowledged these results. Electronically Signed   By: Jeannine Boga M.D.   On: 06/25/2020 03:53    Procedures .Critical Care Performed by: Sharlett Iles, MD Authorized by: Sharlett Iles, MD   Critical care provider statement:  Critical care time (minutes):  40   Critical care was necessary to treat or  prevent imminent or life-threatening deterioration of the following conditions:  CNS failure or compromise   Critical care was time spent personally by me on the following activities:  Development of treatment plan with patient or surrogate, evaluation of patient's response to treatment, examination of patient, obtaining history from patient or surrogate, ordering and performing treatments and interventions, ordering and review of laboratory studies, ordering and review of radiographic studies and re-evaluation of patient's condition     Medications Ordered in ED Medications  sodium chloride flush (NS) 0.9 % injection 3 mL (3 mLs Intravenous Not Given 06/25/20 0431)  clevidipine (CLEVIPREX) infusion 0.5 mg/mL (1 mg/hr Intravenous New Bag/Given 06/25/20 0354)  levETIRAcetam (KEPPRA) 4,000 mg in sodium chloride 0.9 % 250 mL IVPB (0 mg Intravenous Stopped 06/25/20 0459)  valproate (DEPACON) 4,000 mg in dextrose 5 % 50 mL IVPB (0 mg Intravenous Stopped 06/25/20 0432)  LORazepam (ATIVAN) injection 3 mg (3 mg Intravenous Given 06/25/20 0400)  haloperidol lactate (HALDOL) injection 2 mg (2 mg Intravenous Given 06/25/20 0435)    ED Course  I have reviewed the triage vital signs and the nursing notes.  Pertinent labs & imaging results that were available during my care of the patient were reviewed by me and considered in my medical decision making (see chart for details).    MDM Rules/Calculators/A&P                          Patient arrived as code stroke and taken immediately to CT scan where he was met by neurology stroke team, Dr. Theda Sers.  Noted to have left arm weakness and aphasia.  Head CT shows acute intraparenchymal hemorrhage at right temporal lobe without mass-effect.  Patient started on Cleviprex, valproate, and Keppra.  Required Ativan and Haldol for agitation.  Blood pressure goal achieved in the ED and patient admitted to the neuro ICU for further treatment. Final Clinical Impression(s) / ED  Diagnoses Final diagnoses:  Intraparenchymal hemorrhage of brain [facility])    Rx / DC Orders ED Discharge Orders    None       Taliyah Watrous, Wenda Overland, MD 06/25/20 [phone]

## 2020-06-25 NOTE — ED Notes (Signed)
Two Daughters called in to get update on patient. Mathew Malone [phone], [place]. Informed daughters to wait on nurse to become available. Both understood.

## 2020-06-25 NOTE — Progress Notes (Signed)
  2D Echocardiogram has been performed.  Elmer Ramp 06/25/2020, 3:33 PM

## 2020-06-25 NOTE — Progress Notes (Addendum)
STROKE TEAM PROGRESS NOTE   INTERVAL HISTORY His daughter is at the bedside. Pt alternates with obtunded and agitation. Difficult for staff to manage. Currently moving all ext very strong against restraints. Attempting to get work up done today has been challenging today d/t this. Precedex ordered.   Vitals:   06/25/20 0715 06/25/20 0730 06/25/20 0745 06/25/20 0820  BP: (!) 138/91 125/84 127/85   Pulse: 77 76 72 72  Resp: (!) 26 (!) 25 (!) 22   Temp:    98.4 F (36.9 C)  TempSrc:    Axillary  SpO2: 94% 96% 97% 94%  Weight:      Height:       CBC:  Recent Labs  Lab 06/25/20 0340 06/25/20 0350  WBC  --  14.1*  NEUTROABS  --  10.3*  HGB 18.0* 17.0  HCT 53.0* 52.9*  MCV  --  89.5  PLT  --  213*   Basic Metabolic Panel:  Recent Labs  Lab 06/25/20 0340 06/25/20 0350 06/25/20 0800  NA 139 140  --   K 3.1* 3.0*  --   CL 100 98  --   CO2  --  24  --   GLUCOSE 201* 222*  --   BUN 22 15  --   CREATININE 1.00 1.12  --   CALCIUM  --  9.7  --   MG  --   --  1.9  PHOS  --   --  2.5   Lipid Panel:  Recent Labs  Lab 06/25/20 0800  CHOL 141  TRIG 41  HDL 57  CHOLHDL 2.5  VLDL 8  LDLCALC 76   HgbA1c:  Recent Labs  Lab 06/25/20 0800  HGBA1C 5.6   Urine Drug Screen: No results for input(s): LABOPIA, COCAINSCRNUR, LABBENZ, AMPHETMU, THCU, LABBARB in the last 168 hours.  Alcohol Level  Recent Labs  Lab 06/25/20 0800  ETH <10    IMAGING past 24 hours CT HEAD CODE STROKE WO CONTRAST  Result Date: 06/25/2020 CLINICAL DATA:  Code stroke. Initial evaluation for acute left-sided weakness. EXAM: CT HEAD WITHOUT CONTRAST TECHNIQUE: Contiguous axial images were obtained from the base of the skull through the vertex without intravenous contrast. COMPARISON:  None. FINDINGS: Brain: Examination degraded by motion artifact. 2.9 x 2.7 x 1.3 cm acute intraparenchymal hemorrhage seen positioned at the posterior right temporal lobe (estimated volume 5 cc). Mild surrounding vasogenic  edema without significant regional mass effect. No other visible acute intracranial hemorrhage. No other acute large vessel territory infarct. No mass lesion or midline shift. No hydrocephalus or extra-axial fluid collection. Underlying moderate to advanced chronic microvascular ischemic disease. Vascular: Calcified atherosclerosis at the skull base. No asymmetric hyperdense vessel. Skull: No scalp soft tissue abnormality.  Calvarium intact. Sinuses/Orbits: Globes and orbital soft tissues within normal limits. Paranasal sinuses are clear. Trace left mastoid effusion noted, of doubtful significance. Other: None. ASPECTS [facility] Stroke Program Early CT Score) Does not apply given acute intracranial hemorrhage. IMPRESSION: 1. 2.9 x 2.7 x 1.3 cm acute intraparenchymal hemorrhage centered at the posterior right temporal lobe (estimated volume 5 cc). Mild localized edema without significant regional mass effect. 2. Underlying moderate to advanced chronic microvascular ischemic disease. Critical Value/emergent results were called by telephone at the time of interpretation on 06/25/2020 at 3:50 am to provider Dr. Theda Sers, who verbally acknowledged these results. Electronically Signed   By: Jeannine Boga M.D.   On: 06/25/2020 03:53    PHYSICAL EXAM Constitutional: Appears well-developed and well-nourished.  Critically ill, distress/agitation Psych: Unable to assess due to encephalopathy. Eyes: No scleral injection HENT: No OP obstruction. Head: Normocephalic.  Cardiovascular: Normal rate and regular rhythm.  Respiratory: Effort normal, symmetric excursions bilaterally, no audible wheezing. GI: Soft.  No distension. There is no tenderness.  Skin: WDI  Neuro: Mental Status: Will alert to pain. Did not follow commands and did not attend. Only speech was "hey you" to his daughter, which was completely out of context. Visual Fields unable to assess. gaze is dysconjugate and roving. Pupils ~3-26mm are equal,  round, and reactive to light.Face with equal grimacing to pain  Tone is increased in all extremties. Bulk is normal. He is moving all 4 extremities very strong against restraints. Withdraws to pain in all extremities. Toes mute FNF and HKS Unable to assess due to encephalopathy. Gait - Deferred  ASSESSMENT/PLAN Mathew Malone is a 75 y.o. male with history of HTN, HLD presenting with new onset seizures and AMS. 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 Off Cleviprex now . Long-term BP goal normotensive  Hyperlipidemia  Home meds:  Lovaza, Q10  LDL 76, goal < 70  Consider statin at discharge  Dysphagia   NPO now  Speech on board  Consider cortrak if not able to pass swallow in am  Other Stroke Risk Factors  Advanced Age >/= 65   OSA on CPAP at home  Previous Cigarette smoker  Obesity, Body mass index is 31.03 kg/m., BMI >/= 30 associated with increased stroke risk, recommend weight loss, diet and exercise as appropriate   Other Active Problems  PTSD- high risk for delirium. Should resume mood/SSRi meds when able  Prostate Cancer- given this history, will check MRI w/wo to look  for underlying lesions  Chronic urinary retention- resume oxybutynin when able  Grief: Recently lost his wife. He has been having difficulty managing alone. He is visiting his two daughters that live in [place] from [place], [place] all his care through [facility] day # 0  This patient is critically ill due to [place] with seizure, post ictal agitation and at significant risk of neurological worsening, death form hematoma expansion, status epilepticus, brain herniation, aspiration, respiratory failure. This patient's care requires constant monitoring of vital signs, hemodynamics, respiratory and cardiac monitoring, review of multiple databases, neurological assessment, discussion with family, other specialists and medical decision making of high complexity. I spent 40 minutes of neurocritical care time in the care of this patient. I had long discussion with two daughters at bedside, updated pt current condition, treatment plan and potential prognosis, and answered all the questions. They expressed understanding and appreciation.  Rosalin Hawking, MD PhD Stroke Neurology 06/25/2020 10:51 PM   To contact Stroke Continuity provider, please refer to [email or web address] After hours, contact General Neurology

## 2020-06-25 NOTE — Progress Notes (Signed)
PT Cancellation Note  Patient Details Name: Mathew Malone MRN: [id number] DOB: 02/16/1946   Cancelled Treatment:    Reason Eval/Treat Not Completed: Active bedrest order Pt with bedrest orders and awaiting transfer to ICU from ED. Will hold until pt medically appropriate.   Lou Miner, DPT  Acute Rehabilitation Services  Pager: [phone] Office: [phone]    Rudean Hitt 06/25/2020, 7:40 AM

## 2020-06-25 NOTE — H&P (Addendum)
Neurology H&P  Mathew Malone MR# [id number] 06/25/2020  CC: fall with left sided weakness  History is obtained from: EMS   HPI: Mathew Malone is a 75 y.o. male PMHx as reviewed below woke early this morning and walked downstairs and collapsed. EMS activated and noted R gaze, intially flaccid left extremities then started moving left lower extremity. He was also noted to be incontinent of bowel.  LKW: 0300 tpa given: No bleed IR Thrombectomy No Modified Rankin Scale: 0-Completely asymptomatic and back to baseline post- stroke NIHSS: 30  ROS: Unable to assess due to encephalopathy.  Past Medical History:  Diagnosis Date  . Arthritis    "fingers; knees; right shoulder" (04/28/2013)  . Cancer [facility]) 2008   prostate  . Exertional shortness of breath    "here lately" (04/28/2013)  . Gout   . HOH (hard of hearing)    bilateral  . Hyperlipidemia   . Hypertension    stress test- 2011  . OSA on CPAP   . Post traumatic stress disorder (PTSD)     No family history on file.  Social History:  reports that he quit smoking about 49 years ago. His smoking use included cigarettes. He has a 0.48 pack-year smoking history. He has never used smokeless tobacco. He reports current alcohol use of about 1.0 standard drink of alcohol per week. He reports current drug use. Drug: Marijuana.   Prior to Admission medications   Medication Sig Start Date End Date Taking? Authorizing Provider  allopurinol (ZYLOPRIM) 100 MG tablet Take 100 mg by mouth daily.    [provider]  ARIPiprazole (ABILIFY) 30 MG tablet Take 30 mg by mouth daily.    [provider]  ascorbic acid (VITAMIN C) 500 MG tablet Take 500 mg by mouth 2 (two) times daily.    [provider]  aspirin 81 MG chewable tablet Chew 162 mg by mouth at bedtime.    [provider]  calcium-vitamin D (OSCAL WITH D) 500-200 MG-UNIT per tablet Take 1 tablet by mouth daily. 04/29/13   Adrian Prows, MD  carvedilol (COREG) 6.25  MG tablet Take 1 tablet (6.25 mg total) by mouth 2 (two) times daily with a meal. 04/29/13   Adrian Prows, MD  co-enzyme Q-10 30 MG capsule Take 30 mg by mouth daily.     [provider]  gabapentin (NEURONTIN) 600 MG tablet Take 1,200 mg by mouth 3 (three) times daily.    [provider]  galantamine (RAZADYNE ER) 8 MG 24 hr capsule Take 8 mg by mouth daily with breakfast.    [provider]  HYDROcodone-acetaminophen (VICODIN) 5-500 MG per tablet Take 1 tablet by mouth every 6 (six) hours as needed. For pain 04/29/13   Adrian Prows, MD  mirabegron ER (MYRBETRIQ) 25 MG TB24 tablet Take 25 mg by mouth daily.    [provider]  Multiple Vitamin (MULITIVITAMIN WITH MINERALS) TABS Take 1 tablet by mouth daily.    [provider]  omega-3 acid ethyl esters (LOVAZA) 1 G capsule Take 1 g by mouth at bedtime.    [provider]  oxybutynin (DITROPAN-XL) 5 MG 24 hr tablet Take 5 mg by mouth daily.    [provider]  Phentermine-Topiramate (QSYMIA) 7.5-46 MG CP24 Take 1 capsule by mouth daily. Take 1 capsule by mouth daily start day 15 after 14 day course. 04/29/13   Adrian Prows, MD  polyvinyl alcohol (LIQUIFILM TEARS) 1.4 % ophthalmic solution Place into both eyes 2 (two)  times daily as needed for dry eyes.    [provider]  sertraline (ZOLOFT) 100 MG tablet Take 1 tablet (100 mg total) by mouth 2 (two) times daily. 04/29/13   Adrian Prows, MD  simvastatin (ZOCOR) 20 MG tablet Take 1 tablet (20 mg total) by mouth at bedtime. 04/29/13   Adrian Prows, MD  triamterene-hydrochlorothiazide (MAXZIDE) 75-50 MG per tablet Take 0.5 tablets by mouth 2 (two) times daily.    [provider]   Exam: Current vital signs: There were no vitals taken for this visit.  Physical Exam  Constitutional: Appears well-developed and well-nourished.  Psych: Unable to assess due to encephalopathy. Eyes: No scleral injection HENT: No OP obstruction. Head:  Normocephalic.  Cardiovascular: Normal rate and regular rhythm.  Respiratory: Effort normal, symmetric excursions bilaterally, no audible wheezing. GI: Soft.  No distension. There is no tenderness.  Skin: WDI  Neuro: Mental Status: Unable to assess due to encephalopathy. Non-verbal and not following commands.  Visual Fields unable to assess due to encephalopathy and gaze was periodically alternating from left to right.. Pupils ~3-59mm are equal, round, and reactive to light. Doll's (+).  Face was grimacing.  Tone is increased in all extremties. Bulk is normal. Strength Unable to assess due to encephalopathy. Withdraws to pain in all extremities. Deep Tendon Reflexes: Untestable Toes mute FNF and HKS Unable to assess due to encephalopathy. Gait - Deferred  I have reviewed labs in epic and the pertinent results are:   Ref. Range 06/25/2020 03:40  Potassium Latest Ref Range: 3.5 - 5.1 mmol/L 3.1 (L)  Chloride Latest Ref Range: 98 - 111 mmol/L 100  Glucose Latest Ref Range: 70 - 99 mg/dL 201 (H)   I have reviewed the images obtained: NCT head showed acute posterior right temporal lobe intraparenchymal hemorrhage ~5cc.  Assessment: Mathew Malone is a 75 y.o. male PMHx previous history as noted above, HTN, HLD with acute posterior right temporal lobe intraparenchymal hemorrhage and new onset seizure.   Semiology: Grimace, eyes open/up to left  ? left upper flexion with bilateral lower extremity extension  ? left upper extension with internal rotation.  STAT lorazepam 3mg  IV with concurrent load VPA 4200mg  IV over 7 minutes.  Impression:  Acute posterior right temporal lobe intraparenchymal hemorrhage. E2/ V1/M4=7 ICH 1 NIHSS 30 Focal to Bilateral Tonic-clonic Seizure - Right temporal lobe. Hypokalemia Hyperglycemia HTN HLD  Plan: Elevate head of bed keep head midline. Start clevidipine drip for goal SBP<140. X-ray chest. Admit to neuro ICU. Continuous EEG monitoring. LEV  1500mg  two times daily. Consult neurosurgery. Sedation, analgesics (fentanyl). Hold antiplatelets and anticoagulation for now. IV fluids gentle hydration. Replete electrolytes as needed. Labs: Coags, CBC, type and cross, CMP, Mg, Phos, fasting lipids, hA1c, troponins, urinalysis, UDS. Normothermia - Acetaminophen for temperature >37.5C Euglycemia (~ <180) Euvolemia - Strict I/Os Repeat CT head in 6 hours. Precautions: Airway and herniation, seizure, aspiration. PPx: SCDs for now, Senna/docusate, PPI.  This patient is critically ill and at significant risk of neurological worsening, death and care requires constant monitoring of vital signs, hemodynamics,respiratory and cardiac monitoring, neurological assessment, discussion with family, other specialists and medical decision making of high complexity. I spent 75 minutes of neurocritical care time  in the care of  this patient. This was time spent independent of any time provided by nurse practitioner or PA.  Electronically signed by:  Lynnae Sandhoff, MD Page: [phone] 06/25/2020, 3:34 AM

## 2020-06-25 NOTE — ED Notes (Signed)
Returned from CT patient had grandmal seizure lasting approx. 30 sec. MD at bedside.

## 2020-06-25 NOTE — Code Documentation (Signed)
Responded to Code Stroke called at 0318 for L sided weakness and R gaze, [id number]. Pt arrived at 0329, CBG-201, NIH-30, CT head-intraparenchymal hemorrhage. Pt taken back to ED room. Once back in room pt had witnessed seizure-3mg  ativan given IV,  cleviprex started, and depakote and keppra ordered. Plan to admit to ICU.

## 2020-06-26 ENCOUNTER — Inpatient Hospital Stay (HOSPITAL_COMMUNITY): Payer: Medicare Other

## 2020-06-26 DIAGNOSIS — I161 Hypertensive emergency: Secondary | ICD-10-CM | POA: Diagnosis not present

## 2020-06-26 DIAGNOSIS — R410 Disorientation, unspecified: Secondary | ICD-10-CM | POA: Diagnosis not present

## 2020-06-26 DIAGNOSIS — I611 Nontraumatic intracerebral hemorrhage in hemisphere, cortical: Principal | ICD-10-CM

## 2020-06-26 DIAGNOSIS — R569 Unspecified convulsions: Secondary | ICD-10-CM | POA: Diagnosis not present

## 2020-06-26 DIAGNOSIS — I619 Nontraumatic intracerebral hemorrhage, unspecified: Secondary | ICD-10-CM | POA: Diagnosis not present

## 2020-06-26 DIAGNOSIS — G9389 Other specified disorders of brain: Secondary | ICD-10-CM

## 2020-06-26 HISTORY — PX: IR ANGIO INTRA EXTRACRAN SEL COM CAROTID INNOMINATE BILAT MOD SED: IMG5360

## 2020-06-26 HISTORY — PX: IR ANGIO VERTEBRAL SEL SUBCLAVIAN INNOMINATE BILAT MOD SED: IMG5366

## 2020-06-26 LAB — BASIC METABOLIC PANEL
Anion gap: 12 (ref 5–15)
BUN: 16 mg/dL (ref 8–23)
CO2: 24 mmol/L (ref 22–32)
Calcium: 9.4 mg/dL (ref 8.9–10.3)
Chloride: 103 mmol/L (ref 98–111)
Creatinine, Ser: 0.99 mg/dL (ref 0.61–1.24)
GFR, Estimated: >60 mL/min (ref >60)
Glucose, Bld: 140 mg/dL — ABNORMAL HIGH (ref 70–99)
Potassium: 3.1 mmol/L — ABNORMAL LOW (ref 3.5–5.1)
Sodium: 139 mmol/L (ref 135–145)

## 2020-06-26 LAB — GLUCOSE, CAPILLARY
Glucose-Capillary: 109 mg/dL — ABNORMAL HIGH (ref 70–99)
Glucose-Capillary: 115 mg/dL — ABNORMAL HIGH (ref 70–99)
Glucose-Capillary: 128 mg/dL — ABNORMAL HIGH (ref 70–99)
Glucose-Capillary: 135 mg/dL — ABNORMAL HIGH (ref 70–99)
Glucose-Capillary: 137 mg/dL — ABNORMAL HIGH (ref 70–99)
Glucose-Capillary: 141 mg/dL — ABNORMAL HIGH (ref 70–99)
Glucose-Capillary: 152 mg/dL — ABNORMAL HIGH (ref 70–99)

## 2020-06-26 LAB — CBC
HCT: 44.5 % (ref 39.0–52.0)
Hemoglobin: 15.5 g/dL (ref 13.0–17.0)
MCH: 29.3 pg (ref 26.0–34.0)
MCHC: 34.8 g/dL (ref 30.0–36.0)
MCV: 84.1 fL (ref 80.0–100.0)
Platelets: 102 10*3/uL — ABNORMAL LOW (ref 150–400)
RBC: 5.29 MIL/uL (ref 4.22–5.81)
RDW: 13.1 % (ref 11.5–15.5)
WBC: 9.3 10*3/uL (ref 4.0–10.5)
nRBC: 0 % (ref 0.0–0.2)

## 2020-06-26 MED ORDER — SERTRALINE HCL 50 MG PO TABS
50.0000 mg | ORAL_TABLET | Freq: Two times a day (BID) | ORAL | Status: DC
  Administered 2020-06-27 – 2020-07-01 (×9): 50 mg via ORAL
  Filled 2020-06-26 (×9): qty 1

## 2020-06-26 MED ORDER — FENTANYL CITRATE (PF) 100 MCG/2ML IJ SOLN
INTRAMUSCULAR | Status: AC
  Filled 2020-06-26: qty 2

## 2020-06-26 MED ORDER — LIDOCAINE HCL 1 % IJ SOLN
INTRAMUSCULAR | Status: AC
  Filled 2020-06-26: qty 20

## 2020-06-26 MED ORDER — ARIPIPRAZOLE 10 MG PO TABS
10.0000 mg | ORAL_TABLET | Freq: Every day | ORAL | Status: DC
  Administered 2020-06-27 – 2020-07-01 (×5): 10 mg via ORAL
  Filled 2020-06-26 (×6): qty 1

## 2020-06-26 MED ORDER — CLEVIDIPINE BUTYRATE 0.5 MG/ML IV EMUL
0.0000 mg/h | INTRAVENOUS | Status: DC
  Administered 2020-06-26 (×2): 8 mg/h via INTRAVENOUS
  Administered 2020-06-26: 18:00:00 9 mg/h via INTRAVENOUS
  Administered 2020-06-27: 8 mg/h via INTRAVENOUS
  Administered 2020-06-27: 9 mg/h via INTRAVENOUS
  Administered 2020-06-27: 05:00:00 10 mg/h via INTRAVENOUS
  Administered 2020-06-27: 6 mg/h via INTRAVENOUS
  Administered 2020-06-27: 10 mg/h via INTRAVENOUS
  Administered 2020-06-27: 6 mg/h via INTRAVENOUS
  Administered 2020-06-27: 08:00:00 9 mg/h via INTRAVENOUS
  Administered 2020-06-28: 7 mg/h via INTRAVENOUS
  Administered 2020-06-28: 8 mg/h via INTRAVENOUS
  Administered 2020-06-28: 06:00:00 4 mg/h via INTRAVENOUS
  Administered 2020-06-28: 10 mg/h via INTRAVENOUS
  Administered 2020-06-28: 8 mg/h via INTRAVENOUS
  Administered 2020-06-28: 3 mg/h via INTRAVENOUS
  Administered 2020-06-28: 4 mg/h via INTRAVENOUS
  Administered 2020-06-28 – 2020-06-29 (×2): 8 mg/h via INTRAVENOUS
  Filled 2020-06-26 (×3): qty 50
  Filled 2020-06-26: qty 100
  Filled 2020-06-26 (×13): qty 50
  Filled 2020-06-26: qty 100
  Filled 2020-06-26: qty 50

## 2020-06-26 MED ORDER — MIDAZOLAM HCL 2 MG/2ML IJ SOLN
INTRAMUSCULAR | Status: AC
  Filled 2020-06-26: qty 2

## 2020-06-26 MED ORDER — GADOBUTROL 1 MMOL/ML IV SOLN
10.0000 mL | Freq: Once | INTRAVENOUS | Status: AC | PRN
  Administered 2020-06-26: 10 mL via INTRAVENOUS

## 2020-06-26 MED ORDER — POTASSIUM CHLORIDE 10 MEQ/100ML IV SOLN
10.0000 meq | INTRAVENOUS | Status: AC
  Administered 2020-06-26 (×4): 10 meq via INTRAVENOUS
  Filled 2020-06-26 (×4): qty 100

## 2020-06-26 MED ORDER — SODIUM CHLORIDE 0.9 % IV SOLN: INTRAVENOUS | Status: DC

## 2020-06-26 MED ORDER — LIDOCAINE HCL 1 % IJ SOLN
INTRAMUSCULAR | Status: AC | PRN
  Administered 2020-06-26: 10 mL

## 2020-06-26 MED ORDER — POTASSIUM CHLORIDE CRYS ER 20 MEQ PO TBCR: 20.0000 meq | EXTENDED_RELEASE_TABLET | ORAL | Status: DC

## 2020-06-26 MED ORDER — IOHEXOL 300 MG/ML  SOLN
150.0000 mL | Freq: Once | INTRAMUSCULAR | Status: AC | PRN
  Administered 2020-06-26: 75 mL via INTRA_ARTERIAL

## 2020-06-26 NOTE — Progress Notes (Signed)
STROKE TEAM PROGRESS NOTE   INTERVAL HISTORY His daughter is at the bedside.  On precedex, remains drowsy. MRI brain completed-concern for a micro-AVM. Discussed with Dr. Barrie Dunker endovascular-went for a diagnostic cerebral angiogram-official read pending. No evidence of AVM. No evidence of AV fistula or aneurysm. Suboptimal study due to motion artifact.  Vitals:   06/26/20 1115 06/26/20 1130 06/26/20 1200 06/26/20 1300  BP: 116/70 120/70 120/70 114/67  Pulse: (!) 53 (!) 52 (!) 52 (!) 50  Resp: (!) 23 20 19 20   Temp:  (!) 97.5 F (36.4 C)    TempSrc:  Axillary    SpO2: 99% 99% 99% 99%  Weight:      Height:       CBC:  Recent Labs  Lab 06/25/20 0350 06/26/20 0217  WBC 14.1* 9.3  NEUTROABS 10.3*  --   HGB 17.0 15.5  HCT 52.9* 44.5  MCV 89.5 84.1  PLT 145* 128*   Basic Metabolic Panel:  Recent Labs  Lab 06/25/20 0350 06/25/20 0800 06/26/20 0217  NA 140  --  139  K 3.0*  --  3.1*  CL 98  --  103  CO2 24  --  24  GLUCOSE 222*  --  140*  BUN 15  --  16  CREATININE 1.12  --  0.99  CALCIUM 9.7  --  9.4  MG  --  1.9  --   PHOS  --  2.5  --    Lipid Panel:  Recent Labs  Lab 06/25/20 0800  CHOL 141  TRIG 41  HDL 57  CHOLHDL 2.5  VLDL 8  LDLCALC 76   HgbA1c:  Recent Labs  Lab 06/25/20 0800  HGBA1C 5.6   Urine Drug Screen:  Recent Labs  Lab 06/25/20 1409  LABOPIA NONE DETECTED  COCAINSCRNUR NONE DETECTED  LABBENZ NONE DETECTED  AMPHETMU NONE DETECTED  THCU POSITIVE*  LABBARB NONE DETECTED    Alcohol Level  Recent Labs  Lab 06/25/20 0800  ETH <10    IMAGING past 24 hours CT ANGIO HEAD W OR WO CONTRAST  Result Date: 06/25/2020 CLINICAL DATA:  Follow-up acute intracranial hemorrhage. EXAM: CT ANGIOGRAPHY HEAD AND NECK TECHNIQUE: Multidetector CT imaging of the head and neck was performed using the standard protocol during bolus administration of intravenous contrast. Multiplanar CT image reconstructions and MIPs were obtained to evaluate  the vascular anatomy. Carotid stenosis measurements (when applicable) are obtained utilizing NASCET criteria, using the distal internal carotid diameter as the denominator. CONTRAST:  57mL OMNIPAQUE IOHEXOL 350 MG/ML SOLN COMPARISON:  Prior head CT from earlier the same day. FINDINGS: CT HEAD FINDINGS Brain: No significant interval change in size and morphology of estimated 5 cc intraparenchymal hemorrhage centered at the posterior right temporal lobe. Mildly increased localized vasogenic edema without significant regional mass effect. No other new acute intracranial hemorrhage. No other acute large vessel territory infarct. No mass lesion or midline shift. No hydrocephalus or extra-axial fluid collection. Chronic microvascular ischemic disease again noted. Vascular: No hyperdense vessel. Scattered vascular calcifications noted within the carotid siphons. Skull: Scalp soft tissues and calvarium demonstrate no new finding. Sinuses: Paranasal sinuses remain largely clear. Trace left mastoid effusion noted. Orbits: Globes and orbital soft tissues within normal limits. CTA NECK FINDINGS Aortic arch: Visualized arch normal in caliber with normal 3 vessel morphology. Mild plaque about the arch with no hemodynamically significant stenosis about the origin the great vessels. Right carotid system: Right common and internal carotid arteries widely patent without stenosis, dissection  or occlusion. Minimal plaque about the right bifurcation for age. Left carotid system: Left common and internal carotid arteries widely patent without stenosis, dissection or occlusion. Minimal plaque about the left bifurcation for age. Vertebral arteries: Both vertebral arteries arise from the subclavian arteries. No proximal subclavian artery stenosis. Both vertebral arteries widely patent without stenosis, dissection or occlusion. Skeleton: No acute osseous finding. No discrete or worrisome osseous lesions. Other neck: No other acute soft  tissue abnormality within the neck. No mass or adenopathy. Upper chest: Scattered atelectatic changes noted within the visualized lungs. Visualized upper chest demonstrates no other acute finding. Review of the MIP images confirms the above findings CTA HEAD FINDINGS Anterior circulation: Petrous segments widely patent. Mild atheromatous change within the carotid siphons without hemodynamically significant stenosis. A1 segments patent bilaterally. Normal anterior communicating artery complex. Anterior cerebral arteries patent to their distal aspects without stenosis. No M1 stenosis or occlusion. Normal MCA bifurcations. Distal MCA branches well perfused bilaterally. Posterior circulation: Both V4 segments patent to the vertebrobasilar junction, with the left being slightly dominant. Both PICA origins patent and normal. Basilar diminutive but patent to its distal aspect without stenosis. Superior cerebellar arteries patent bilaterally. Fetal type origin of the PCAs bilaterally. PCAs well perfused to their distal aspects without stenosis. Venous sinuses: Patent. Anatomic variants: None significant. There is an apparent 7 mm blush of contrast at the posterior right temporal lobe immediately superior to the right temporal lobe hemorrhage (series 11, image 86). Finding is favored to reflect a focal venous confluence. No other AVM or other vascular abnormality seen underlying the acute intraparenchymal bleed. Review of the MIP images confirms the above findings IMPRESSION: 1. No significant interval change in size and morphology of estimated 5 cc intraparenchymal hemorrhage centered at the posterior right temporal lobe. Mildly increased localized vasogenic edema without significant regional mass effect. No other new acute intracranial abnormality. 2. 7 mm blush of contrast at the posterior right temporal lobe, immediately adjacent to the right temporal lobe hemorrhage. While this is favored to reflect a focal venous  confluence, confirmation with dedicated catheter directed angiography suggested for confirmatory purposes given its close proximity to the adjacent bleed. 3. Mild atherosclerotic change about the carotid bifurcations and carotid siphons without hemodynamically significant stenosis. 4. Otherwise negative CTA of the head and neck. No large vessel occlusion, hemodynamically significant stenosis, or other acute vascular abnormality. Electronically Signed   By: Jeannine Boga M.D.   On: 06/25/2020 19:47   CT ANGIO NECK W OR WO CONTRAST  Result Date: 06/25/2020 CLINICAL DATA:  Follow-up acute intracranial hemorrhage. EXAM: CT ANGIOGRAPHY HEAD AND NECK TECHNIQUE: Multidetector CT imaging of the head and neck was performed using the standard protocol during bolus administration of intravenous contrast. Multiplanar CT image reconstructions and MIPs were obtained to evaluate the vascular anatomy. Carotid stenosis measurements (when applicable) are obtained utilizing NASCET criteria, using the distal internal carotid diameter as the denominator. CONTRAST:  94mL OMNIPAQUE IOHEXOL 350 MG/ML SOLN COMPARISON:  Prior head CT from earlier the same day. FINDINGS: CT HEAD FINDINGS Brain: No significant interval change in size and morphology of estimated 5 cc intraparenchymal hemorrhage centered at the posterior right temporal lobe. Mildly increased localized vasogenic edema without significant regional mass effect. No other new acute intracranial hemorrhage. No other acute large vessel territory infarct. No mass lesion or midline shift. No hydrocephalus or extra-axial fluid collection. Chronic microvascular ischemic disease again noted. Vascular: No hyperdense vessel. Scattered vascular calcifications noted within the carotid siphons. Skull: Scalp  soft tissues and calvarium demonstrate no new finding. Sinuses: Paranasal sinuses remain largely clear. Trace left mastoid effusion noted. Orbits: Globes and orbital soft tissues  within normal limits. CTA NECK FINDINGS Aortic arch: Visualized arch normal in caliber with normal 3 vessel morphology. Mild plaque about the arch with no hemodynamically significant stenosis about the origin the great vessels. Right carotid system: Right common and internal carotid arteries widely patent without stenosis, dissection or occlusion. Minimal plaque about the right bifurcation for age. Left carotid system: Left common and internal carotid arteries widely patent without stenosis, dissection or occlusion. Minimal plaque about the left bifurcation for age. Vertebral arteries: Both vertebral arteries arise from the subclavian arteries. No proximal subclavian artery stenosis. Both vertebral arteries widely patent without stenosis, dissection or occlusion. Skeleton: No acute osseous finding. No discrete or worrisome osseous lesions. Other neck: No other acute soft tissue abnormality within the neck. No mass or adenopathy. Upper chest: Scattered atelectatic changes noted within the visualized lungs. Visualized upper chest demonstrates no other acute finding. Review of the MIP images confirms the above findings CTA HEAD FINDINGS Anterior circulation: Petrous segments widely patent. Mild atheromatous change within the carotid siphons without hemodynamically significant stenosis. A1 segments patent bilaterally. Normal anterior communicating artery complex. Anterior cerebral arteries patent to their distal aspects without stenosis. No M1 stenosis or occlusion. Normal MCA bifurcations. Distal MCA branches well perfused bilaterally. Posterior circulation: Both V4 segments patent to the vertebrobasilar junction, with the left being slightly dominant. Both PICA origins patent and normal. Basilar diminutive but patent to its distal aspect without stenosis. Superior cerebellar arteries patent bilaterally. Fetal type origin of the PCAs bilaterally. PCAs well perfused to their distal aspects without stenosis. Venous  sinuses: Patent. Anatomic variants: None significant. There is an apparent 7 mm blush of contrast at the posterior right temporal lobe immediately superior to the right temporal lobe hemorrhage (series 11, image 86). Finding is favored to reflect a focal venous confluence. No other AVM or other vascular abnormality seen underlying the acute intraparenchymal bleed. Review of the MIP images confirms the above findings IMPRESSION: 1. No significant interval change in size and morphology of estimated 5 cc intraparenchymal hemorrhage centered at the posterior right temporal lobe. Mildly increased localized vasogenic edema without significant regional mass effect. No other new acute intracranial abnormality. 2. 7 mm blush of contrast at the posterior right temporal lobe, immediately adjacent to the right temporal lobe hemorrhage. While this is favored to reflect a focal venous confluence, confirmation with dedicated catheter directed angiography suggested for confirmatory purposes given its close proximity to the adjacent bleed. 3. Mild atherosclerotic change about the carotid bifurcations and carotid siphons without hemodynamically significant stenosis. 4. Otherwise negative CTA of the head and neck. No large vessel occlusion, hemodynamically significant stenosis, or other acute vascular abnormality. Electronically Signed   By: Jeannine Boga M.D.   On: 06/25/2020 19:47   MR BRAIN W WO CONTRAST  Result Date: 06/26/2020 CLINICAL DATA:  Cerebral hemorrhage suspected. EXAM: MRI HEAD WITHOUT AND WITH CONTRAST TECHNIQUE: Multiplanar, multiecho pulse sequences of the brain and surrounding structures were obtained without and with intravenous contrast. CONTRAST:  50mL GADAVIST GADOBUTROL 1 MMOL/ML IV SOLN COMPARISON:  Head CT Aug 25, 2020 FINDINGS: The study is partially degraded by motion. Brain: No significant interval change in size of the posterior right temporal lobe intraparenchymal hematoma measuring  approximately 2.9 x 2.6 x 1.4 cm with mild surrounding vasogenic edema. There is effacement of the adjacent cerebral sulci without significant  mass effect or midline shift. Prominent vessels are seen in the superior aspect of the hematoma (series 19, image 33) which may represent displaced leptomeningeal vessels although the possibility of a micro AVM cannot be entirely excluded. No focus of abnormal contrast enhancement identified. No acute infarct, hydrocephalus or extra-axial collection. Scattered and confluent foci of T2 hyperintensity are seen within the white matter of the cerebral hemispheres, nonspecific, most likely related to chronic microangiopathic changes. Vascular: Normal flow voids. Skull and upper cervical spine: Normal marrow signal. Sinuses/Orbits: Negative. IMPRESSION: 1. No significant interval change in size of the posterior right temporal lobe intraparenchymal hematoma measuring approximately 2.9 x 2.6 x 1.4 cm with mild surrounding vasogenic edema. No focus of abnormal contrast enhancement identified. 2. Prominent vessels in the superior aspect of the hematoma may represent displaced leptomeningeal vessels although the possibility of a micro AVM cannot be entirely excluded. Electronically Signed   By: Pedro Earls M.D.   On: 06/26/2020 08:37   EEG adult  Result Date: 06/26/2020 Lora Havens, MD     06/26/2020  4:57 PM Patient Name: Mathew Malone MRN: [id number] Epilepsy Attending: Lora Havens Referring Physician/Provider: Dr Amie Portland Date: 06/26/2020 Duration: 24.26 mins Patient history: 74yoM with right ICH. EEG to evaluate for seizure Level of alertness: Awake, asleep AEDs during EEG study: LEV Technical aspects: This EEG study was done with scalp electrodes positioned according to the 10-20 International system of electrode placement. Electrical activity was acquired at a sampling rate of 500Hz  and reviewed with a high frequency filter of 70Hz  and a low frequency  filter of 1Hz . EEG data were recorded continuously and digitally stored. Description: The posterior dominant rhythm consists of 8-9 Hz activity of moderate voltage (25-35 uV) seen predominantly in posterior head regions, symmetric and reactive to eye opening and eye closing. Sleep was characterized by vertex waves, sleep spindles (12 to 14 Hz), maximal frontocentral region.  EEG showed intermittent right temporal 3 to 6 Hz theta-delta slowing. Sharp waves were also seen in right posterior temporal region. Hyperventilation and photic stimulation were not performed.   ABNORMALITY -Intermittent slow, right temporal region -Sharp wave, right posterior temporal region. IMPRESSION: This study showed evidence of potential epileptogenicity arising from right posterior temporal region as well as cortical dysfunction in the right temporal region likely secondary to underlying bleed. No seizures were seen throughout the recording. [place]    PHYSICAL EXAM General: On Precedex, not intubated HEENT: Head wrapped in cover for EEG leads in place, otherwise normocephalic, left lateral tongue bite and dried blood in mouth. CVS: Bradycardic Extremities warm well perfused Neurological exam Drowsy, opens eyes to voice. Reduced attention and concentration Mild dysarthria No aphasia Cranial nerves: Pupils equal round react to light, extraocular movements intact, visual fields full, face appears symmetric, facial sensation intact, tongue and palate midline. Motor exam: Antigravity in all fours without drift. Sensation intact light touch all over Difficult to assess for dysmetria given his poor attention concentration    Current Facility-Administered Medications:  .   stroke: mapping our early stages of recovery book, , Does not apply, Once, Gwinda Maine, MD .  0.9 %  sodium chloride infusion, , Intravenous, Continuous, Rosalin Hawking, MD, Last Rate: 50 mL/hr at 06/26/20 1200, Infusion Verify at 06/26/20  1200 .  0.9 %  sodium chloride infusion, , Intravenous, Continuous, Deveshwar, Sanjeev, MD .  acetaminophen (TYLENOL) tablet 650 mg, 650 mg, Oral, Q4H PRN **OR** acetaminophen (TYLENOL) 160 MG/5ML solution 650 mg,  650 mg, Per Tube, Q4H PRN **OR** acetaminophen (TYLENOL) suppository 650 mg, 650 mg, Rectal, Q4H PRN, Gwinda Maine, MD .  Chlorhexidine Gluconate Cloth 2 % PADS 6 each, 6 each, Topical, Daily, Rosalin Hawking, MD, 6 each at 06/25/20 1759 .  clevidipine (CLEVIPREX) infusion 0.5 mg/mL, 0-21 mg/hr, Intravenous, Continuous, Amie Portland, MD .  dexmedetomidine (PRECEDEX) 400 MCG/100ML (4 mcg/mL) infusion, 0.4-1.2 mcg/kg/hr, Intravenous, Titrated, Metzger-Cihelka, Desiree, NP, Last Rate: 7.36 mL/hr at 06/26/20 1703, 0.3 mcg/kg/hr at 06/26/20 1703 .  levETIRAcetam (KEPPRA) IVPB 1500 mg/ 100 mL premix, 1,500 mg, Intravenous, Q12H, Rosalin Hawking, MD, Last Rate: 400 mL/hr at 06/26/20 1353, 1,500 mg at 06/26/20 1353 .  lidocaine (XYLOCAINE) 1 % (with pres) injection, , , ,  .  LORazepam (ATIVAN) injection 1 mg, 1 mg, Intravenous, Q15 min PRN, Metzger-Cihelka, Desiree, NP .  pantoprazole (PROTONIX) injection 40 mg, 40 mg, Intravenous, QHS, Collins, Unk Pinto, MD, 40 mg at 06/25/20 2300 .  potassium chloride 10 mEq in 100 mL IVPB, 10 mEq, Intravenous, Q1 Hr x 4, Amie Portland, MD, Last Rate: 100 mL/hr at 06/26/20 1705, 10 mEq at 06/26/20 1705 .  potassium chloride SA (KLOR-CON) CR tablet 20 mEq, 20 mEq, Oral, Q4H, Amie Portland, MD .  senna-docusate (Senokot-S) tablet 1 tablet, 1 tablet, Oral, BID, Collins, Unk Pinto, MD .  sodium chloride flush (NS) 0.9 % injection 3 mL, 3 mL, Intravenous, Once, Pollina, Gwenyth Allegra, MD    ASSESSMENT/PLAN Mr. Nayef College is a 75 y.o. male with history of HTN, HLD presenting with new onset seizures and AMS. 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   MRI brain reviewed: Does not have the typical appearance of cerebral amyloid angiopathy. There was concern for  micro-AVM but diagnostic cerebral angiogram done today by Dr. Estanislado Pandy reports no AVM aneurysm or dural AV fistula.  ICH has been stable on multiple CTs and MRIs done throughout the course of this hospitalization for now.    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 Continuous EEG running right now.  PCCM consultation-especially for management of Precedex as well as bradycardia.  Hypertension  Home meds:  Maxzide, Coreg, Zestril, Hydrodiuril-unclear compliance-suspect non-compliance to meds. [place] records not available. . Back on Cleviprex. . Long-term BP goal normotensive  Hyperlipidemia  Home meds:  Lovaza, Q10  LDL 76, goal < 70  Consider statin at discharge  Dysphagia   NPO now  Speech on board  Consider cortrak if not able to pass swallow  Other Stroke Risk Factors  Advanced Age >/= 65   OSA on CPAP at home  Previous Cigarette smoker  Obesity, Body mass index is 31.03 kg/m., BMI >/= 30 associated with increased stroke risk, recommend weight loss, diet and exercise as appropriate   Other Active Problems  PTSD- high  risk for delirium. Should resume mood/SSRi meds when able  Prostate Cancer- given this history, will check MRI w/wo to look for underlying lesions  Chronic urinary retention-  resume oxybutynin when able  Grief: Recently lost his wife. He has been having difficulty managing alone. He is visiting his two daughters that live in [place] from [place], [place] [facility]  He is on maximum dose of Abilify-30 daily. Also on sertraline 100 twice daily. We will change sertraline to  50 milligrams twice daily for now. We will change Abilify to 10 mg daily for now.    Consult: PCCM for Precedex and bradycardia management  Hospital day # 1  This patient is critically ill due to [place] with seizure, post ictal agitation and at significant risk of neurological worsening, death form hematoma expansion, status epilepticus, brain herniation, aspiration, respiratory failure. This patient's care requires constant monitoring of vital signs, hemodynamics, respiratory and cardiac monitoring, review of multiple databases, neurological assessment, discussion with family, other specialists and medical decision making of high complexity. I spent 45 minutes of neurocritical care time in the care of this patient. I had long discussion with  daughters at bedside (1 in person-1 on FaceTime), updated pt current condition, treatment plan and potential prognosis, and answered all the questions. They expressed understanding and appreciation.   -- Amie Portland, MD Neurologist [facility] Pager: [phone]    To contact Stroke Continuity provider, please refer to [email or web address] After hours, contact General Neurology

## 2020-06-26 NOTE — Sedation Documentation (Signed)
Patient transported to [facility] ICU where staff was waiting in room for patients arrival. Bedside report given to [facility]. Groin site assessed. Clean, dry and intact. Soft upon palpation, no hematoma noted. No drainage from dressing. Distal pulses intact +2

## 2020-06-26 NOTE — Procedures (Signed)
S/P 4 vessel cerebral artreriogram RT CFA approach. Findings. Study marred by motion artifact on most runs. 1.Grossly however no evidence of aneurysm,AV shunting,DAVF, extracranial or intracranial dissections or of occlusions or stenosis. 2.Venous outflow intact. S.Lovella Hardie MD

## 2020-06-26 NOTE — Progress Notes (Signed)
EEG completed, results pending. 

## 2020-06-26 NOTE — Procedures (Signed)
Patient Name: Mathew Malone  MRN: [id number]  Epilepsy Attending: Lora Havens  Referring Physician/Provider: Dr Amie Portland Date: 06/26/2020 Duration: 24.26 mins  Patient history: 74yoM with right ICH. EEG to evaluate for seizure  Level of alertness: Awake, asleep  AEDs during EEG study: LEV  Technical aspects: This EEG study was done with scalp electrodes positioned according to the 10-20 International system of electrode placement. Electrical activity was acquired at a sampling rate of 500Hz  and reviewed with a high frequency filter of 70Hz  and a low frequency filter of 1Hz . EEG data were recorded continuously and digitally stored.   Description: The posterior dominant rhythm consists of 8-9 Hz activity of moderate voltage (25-35 uV) seen predominantly in posterior head regions, symmetric and reactive to eye opening and eye closing. Sleep was characterized by vertex waves, sleep spindles (12 to 14 Hz), maximal frontocentral region.  EEG showed intermittent right temporal 3 to 6 Hz theta-delta slowing. Sharp waves were also seen in right posterior temporal region.  Hyperventilation and photic stimulation were not performed.     ABNORMALITY -Intermittent slow, right temporal region -Sharp wave, right posterior temporal region.   IMPRESSION: This study showed evidence of potential epileptogenicity arising from right posterior temporal region as well as cortical dysfunction in the right temporal region likely secondary to underlying bleed. No seizures were seen throughout the recording.  [name] [name]

## 2020-06-26 NOTE — Consult Note (Signed)
Chief Complaint: Patient was seen in consultation today for  Chief Complaint  Patient presents with  . Code Stroke    Referring Physician(s): Dr. Rory Percy  Supervising Physician: Luanne Bras  Patient Status: [facility] - In-pt  History of Present Illness: Mathew Malone is a 75 y.o. male with a medical history significant for HTN, HLD, gout, OSA on CPAP and prostate cancer. He arrived to the ED 06/25/20 via EMS after he had a new onset seizure with a fall at home. He was noted to have left-sided weakness, right gaze deviation and aphasia. Imaging was positive for an intracranial hemorrhage with suspected AVM.   CT Head Code Stroke 06/25/20 IMPRESSION: 1. 2.9 x 2.7 x 1.3 cm acute intraparenchymal hemorrhage centered at the posterior right temporal lobe (estimated volume 5 cc). Mild localized edema without significant regional mass effect. 2. Underlying moderate to advanced chronic microvascular ischemic Disease.  CT Angio Head/Neck IMPRESSION: 1. No significant interval change in size and morphology of estimated 5 cc intraparenchymal hemorrhage centered at the posterior right temporal lobe. Mildly increased localized vasogenic edema without significant regional mass effect. No other new acute intracranial abnormality. 2. 7 mm blush of contrast at the posterior right temporal lobe, immediately adjacent to the right temporal lobe hemorrhage. While this is favored to reflect a focal venous confluence, confirmation with dedicated catheter directed angiography suggested for confirmatory purposes given its close proximity to the adjacent bleed. 3. Mild atherosclerotic change about the carotid bifurcations and carotid siphons without hemodynamically significant stenosis. 4. Otherwise negative CTA of the head and neck. No large vessel occlusion, hemodynamically significant stenosis, or other acute vascular abnormality.  Neuro Interventional Radiology has been asked to evaluate this  patient for an image-guided diagnostic cerebral angiogram for further work up. The patient was assessed in the ICU, his daughter was at the bedside.   Past Medical History:  Diagnosis Date  . Arthritis    "fingers; knees; right shoulder" (04/28/2013)  . Cancer [facility]) 2008   prostate  . Exertional shortness of breath    "here lately" (04/28/2013)  . Gout   . HOH (hard of hearing)    bilateral  . Hyperlipidemia   . Hypertension    stress test- 2011  . OSA on CPAP   . Post traumatic stress disorder (PTSD)     Past Surgical History:  Procedure Laterality Date  . INGUINAL HERNIA REPAIR Right 1970's  . JOINT REPLACEMENT    . LEFT HEART CATHETERIZATION WITH CORONARY ANGIOGRAM N/A 04/29/2013   Procedure: LEFT HEART CATHETERIZATION WITH CORONARY ANGIOGRAM;  Surgeon: Laverda Page, MD;  Location: [facility] CATH LAB;  Service: Cardiovascular;  Laterality: N/A;  . PENILE PROSTHESIS IMPLANT  04/05/2011   Procedure: PENILE PROTHESIS INFLATABLE;  Surgeon: Claybon Jabs, MD;  Location: [facility];  Service: Urology;  Laterality: N/A;   EXPLORATION AND REVISION OF INFLATABLE PENILE   PROSTHESIS    PUMP  . PENILE PROSTHESIS IMPLANT  10/21/2011   Procedure: PENILE PROTHESIS INFLATABLE;  Surgeon: Claybon Jabs, MD;  Location: [facility];  Service: Urology;  Laterality: N/A;     . PENILE PROSTHESIS PLACEMENT  12/2010  . ROBOT ASSISTED LAPAROSCOPIC RADICAL PROSTATECTOMY  2008  . SHOULDER OPEN ROTATOR CUFF REPAIR Bilateral 2007-2008   "twice on each side"  . TONSILLECTOMY    . TOTAL KNEE ARTHROPLASTY Bilateral 2011-2012   lt knee-2012  / rt knee -2011    Allergies: Patient has no known allergies.  Medications: Prior to  Admission medications   Medication Sig Start Date End Date Taking? Authorizing Provider  acetaminophen (TYLENOL) 500 MG tablet Take 1,000 mg by mouth every 6 (six) hours as needed for mild pain.   Yes [provider]  [place] 450 MG CAPS  Take 450 mg by mouth daily.   Yes [provider]  cetirizine (ZYRTEC) 10 MG tablet Take 10 mg by mouth daily.   Yes [provider]  DHA-Vitamin C-Lutein (EYE HEALTH FORMULA PO) Take 470 mg by mouth daily. Eye bright tab   Yes [provider]  hydrochlorothiazide (HYDRODIURIL) 25 MG tablet Take 12.5 mg by mouth daily. 05/22/12 12/06/20 Yes [provider]  ibuprofen (ADVIL) 800 MG tablet Take 800 mg by mouth every 8 (eight) hours as needed. 06/06/20  Yes [provider]  Iron-Vitamins (GERITOL) LIQD Take 1 Dose by mouth daily.   Yes [provider]  lisinopril (ZESTRIL) 40 MG tablet Take 40 mg by mouth daily.   Yes [provider]  Misc Natural Products (TOTAL MEMORY & FOCUS FORMULA PO) Take 1 tablet by mouth daily.   Yes [provider]  OVER THE COUNTER MEDICATION Take 1 tablet by mouth daily. [place] energizer   Yes [provider]  PEG-KCl-NaCl-NaSulf-Na Asc-C (PLENVU) 140 g SOLR Take 140 g by mouth See admin instructions. As directed for bowel prep 05/25/20  Yes [provider]  simvastatin (ZOCOR) 40 MG tablet Take 20 mg by mouth daily.   Yes [provider]  allopurinol (ZYLOPRIM) 100 MG tablet Take 200 mg by mouth daily.    [provider]  ARIPiprazole (ABILIFY) 30 MG tablet Take 30 mg by mouth daily.    [provider]  ascorbic acid (VITAMIN C) 500 MG tablet Take 500 mg by mouth 2 (two) times daily.    [provider]  aspirin 81 MG chewable tablet Chew 162 mg by mouth at bedtime.    [provider]  calcium-vitamin D (OSCAL WITH D) 500-200 MG-UNIT per tablet Take 1 tablet by mouth daily. 04/29/13   Adrian Prows, MD  carvedilol (COREG) 6.25 MG tablet Take 1 tablet (6.25 mg total) by mouth 2 (two) times daily with a meal. 04/29/13   Adrian Prows, MD  co-enzyme Q-10 30 MG capsule Take 30 mg by mouth daily.     [provider]  gabapentin (NEURONTIN) 600 MG  tablet Take 1,200 mg by mouth 3 (three) times daily.    [provider]  galantamine (RAZADYNE ER) 8 MG 24 hr capsule Take 8 mg by mouth daily with breakfast.    [provider]  HYDROcodone-acetaminophen (VICODIN) 5-500 MG per tablet Take 1 tablet by mouth every 6 (six) hours as needed. For pain 04/29/13   Adrian Prows, MD  mirabegron ER (MYRBETRIQ) 50 MG TB24 tablet Take 50 mg by mouth daily.    [provider]  Multiple Vitamin (MULITIVITAMIN WITH MINERALS) TABS Take 1 tablet by mouth daily.    [provider]  omega-3 acid ethyl esters (LOVAZA) 1 G capsule Take 1 g by mouth at bedtime.    [provider]  oxybutynin (DITROPAN-XL) 5 MG 24 hr tablet Take 5 mg by mouth daily.    [provider]  Phentermine-Topiramate (QSYMIA) 7.5-46 MG CP24 Take 1 capsule by mouth daily. Take 1 capsule by mouth daily start day 15 after 14 day course. 04/29/13   Adrian Prows, MD  polyvinyl alcohol (LIQUIFILM TEARS) 1.4 % ophthalmic solution Place into both eyes  2 (two) times daily as needed for dry eyes.    [provider]  sertraline (ZOLOFT) 100 MG tablet Take 1 tablet (100 mg total) by mouth 2 (two) times daily. 04/29/13   Adrian Prows, MD  triamterene-hydrochlorothiazide (MAXZIDE) 75-50 MG per tablet Take 0.5 tablets by mouth 2 (two) times daily.    [provider]     No family history on file.  Social History   Socioeconomic History  . Marital status: Married    Spouse name: Not on file  . Number of children: Not on file  . Years of education: Not on file  . Highest education level: Not on file  Occupational History  . Not on file  Tobacco Use  . Smoking status: Former Smoker    Packs/day: 0.12    Years: 4.00    Pack years: 0.48    Types: Cigarettes    Quit date: 04/04/1971    Years since quitting: 49.2  . Smokeless tobacco: Never Used  Substance and Sexual Activity  . Alcohol use: Yes    Alcohol/week: 1.0 standard drink    Types:  1 Glasses of wine per week  . Drug use: Yes    Types: Marijuana    Comment: 04/28/2013 "quit smoking marijuana ~ 1974"  . Sexual activity: Not Currently  Other Topics Concern  . Not on file  Social History Narrative  . Not on file   Social Determinants of Health   Financial Resource Strain: Not on file  Food Insecurity: Not on file  Transportation Needs: Not on file  Physical Activity: Not on file  Stress: Not on file  Social Connections: Not on file    Review of Systems: A 12 point ROS discussed and pertinent positives are indicated in the HPI above.  All other systems are negative.  Review of Systems  Unable to perform ROS: Mental status change    Vital Signs: BP 121/72 (BP Location: Left Arm)   Pulse (!) 54   Temp 98.3 F (36.8 C) (Axillary)   Resp 11   Ht 5\' 10"  (1.778 m)   Wt 216 lb 4.3 oz (98.1 kg)   SpO2 99%   BMI 31.03 kg/m   Physical Exam Constitutional:      Comments: Opened eyes intermittently; intermittent garbled speech. Unable to answer questions appropriately. Became combative when touched or encouraged to answer questions - tried to get out of bed, thrashing in bed, etc.   HENT:     Mouth/Throat:     Mouth: Mucous membranes are moist.     Pharynx: Oropharynx is clear.  Cardiovascular:     Rate and Rhythm: Regular rhythm. Bradycardia present.     Pulses: Normal pulses.  Pulmonary:     Effort: Pulmonary effort is normal.  Abdominal:     Palpations: Abdomen is soft.  Skin:    General: Skin is warm and dry.  Neurological:     Mental Status: He is disoriented.     Imaging: CT ANGIO HEAD W OR WO CONTRAST  Result Date: 06/25/2020 CLINICAL DATA:  Follow-up acute intracranial hemorrhage. EXAM: CT ANGIOGRAPHY HEAD AND NECK TECHNIQUE: Multidetector CT imaging of the head and neck was performed using the standard protocol during bolus administration of intravenous contrast. Multiplanar CT image reconstructions and MIPs were obtained to evaluate the  vascular anatomy. Carotid stenosis measurements (when applicable) are obtained utilizing NASCET criteria, using the distal internal carotid diameter as the denominator. CONTRAST:  95mL OMNIPAQUE IOHEXOL 350 MG/ML SOLN COMPARISON:  Prior head CT from earlier the same day. FINDINGS: CT HEAD FINDINGS Brain: No significant interval change in size and morphology of estimated 5 cc intraparenchymal hemorrhage centered at the posterior right temporal lobe. Mildly increased localized vasogenic edema without significant regional mass effect. No other new acute intracranial hemorrhage. No other acute large vessel territory infarct. No mass lesion or midline shift. No hydrocephalus or extra-axial fluid collection. Chronic microvascular ischemic disease again noted. Vascular: No hyperdense vessel. Scattered vascular calcifications noted within the carotid siphons. Skull: Scalp soft tissues and calvarium demonstrate no new finding. Sinuses: Paranasal sinuses remain largely clear. Trace left mastoid effusion noted. Orbits: Globes and orbital soft tissues within normal limits. CTA NECK FINDINGS Aortic arch: Visualized arch normal in caliber with normal 3 vessel morphology. Mild plaque about the arch with no hemodynamically significant stenosis about the origin the great vessels. Right carotid system: Right common and internal carotid arteries widely patent without stenosis, dissection or occlusion. Minimal plaque about the right bifurcation for age. Left carotid system: Left common and internal carotid arteries widely patent without stenosis, dissection or occlusion. Minimal plaque about the left bifurcation for age. Vertebral arteries: Both vertebral arteries arise from the subclavian arteries. No proximal subclavian artery stenosis. Both vertebral arteries widely patent without stenosis, dissection or occlusion. Skeleton: No acute osseous finding. No discrete or worrisome osseous lesions. Other neck: No other acute soft tissue  abnormality within the neck. No mass or adenopathy. Upper chest: Scattered atelectatic changes noted within the visualized lungs. Visualized upper chest demonstrates no other acute finding. Review of the MIP images confirms the above findings CTA HEAD FINDINGS Anterior circulation: Petrous segments widely patent. Mild atheromatous change within the carotid siphons without hemodynamically significant stenosis. A1 segments patent bilaterally. Normal anterior communicating artery complex. Anterior cerebral arteries patent to their distal aspects without stenosis. No M1 stenosis or occlusion. Normal MCA bifurcations. Distal MCA branches well perfused bilaterally. Posterior circulation: Both V4 segments patent to the vertebrobasilar junction, with the left being slightly dominant. Both PICA origins patent and normal. Basilar diminutive but patent to its distal aspect without stenosis. Superior cerebellar arteries patent bilaterally. Fetal type origin of the PCAs bilaterally. PCAs well perfused to their distal aspects without stenosis. Venous sinuses: Patent. Anatomic variants: None significant. There is an apparent 7 mm blush of contrast at the posterior right temporal lobe immediately superior to the right temporal lobe hemorrhage (series 11, image 86). Finding is favored to reflect a focal venous confluence. No other AVM or other vascular abnormality seen underlying the acute intraparenchymal bleed. Review of the MIP images confirms the above findings IMPRESSION: 1. No significant interval change in size and morphology of estimated 5 cc intraparenchymal hemorrhage centered at the posterior right temporal lobe. Mildly increased localized vasogenic edema without significant regional mass effect. No other new acute intracranial abnormality. 2. 7 mm blush of contrast at the posterior right temporal lobe, immediately adjacent to the right temporal lobe hemorrhage. While this is favored to reflect a focal venous confluence,  confirmation with dedicated catheter directed angiography suggested for confirmatory purposes given its close proximity to the adjacent bleed. 3. Mild atherosclerotic change about the carotid bifurcations and carotid siphons without hemodynamically significant stenosis. 4. Otherwise negative CTA of the head and neck. No large vessel occlusion, hemodynamically significant stenosis, or other acute vascular abnormality. Electronically Signed   By: Jeannine Boga M.D.   On: 06/25/2020 19:47   CT ANGIO NECK W OR WO CONTRAST  Result Date: 06/25/2020 CLINICAL DATA:  Follow-up acute intracranial hemorrhage. EXAM: CT ANGIOGRAPHY HEAD AND NECK TECHNIQUE: Multidetector CT imaging of the head and neck was performed using the standard protocol during bolus administration of intravenous contrast. Multiplanar CT image reconstructions and MIPs were obtained to evaluate the vascular anatomy. Carotid stenosis measurements (when applicable) are obtained utilizing NASCET criteria, using the distal internal carotid diameter as the denominator. CONTRAST:  48mL OMNIPAQUE IOHEXOL 350 MG/ML SOLN COMPARISON:  Prior head CT from earlier the same day. FINDINGS: CT HEAD FINDINGS Brain: No significant interval change in size and morphology of estimated 5 cc intraparenchymal hemorrhage centered at the posterior right temporal lobe. Mildly increased localized vasogenic edema without significant regional mass effect. No other new acute intracranial hemorrhage. No other acute large vessel territory infarct. No mass lesion or midline shift. No hydrocephalus or extra-axial fluid collection. Chronic microvascular ischemic disease again noted. Vascular: No hyperdense vessel. Scattered vascular calcifications noted within the carotid siphons. Skull: Scalp soft tissues and calvarium demonstrate no new finding. Sinuses: Paranasal sinuses remain largely clear. Trace left mastoid effusion noted. Orbits: Globes and orbital soft tissues within normal  limits. CTA NECK FINDINGS Aortic arch: Visualized arch normal in caliber with normal 3 vessel morphology. Mild plaque about the arch with no hemodynamically significant stenosis about the origin the great vessels. Right carotid system: Right common and internal carotid arteries widely patent without stenosis, dissection or occlusion. Minimal plaque about the right bifurcation for age. Left carotid system: Left common and internal carotid arteries widely patent without stenosis, dissection or occlusion. Minimal plaque about the left bifurcation for age. Vertebral arteries: Both vertebral arteries arise from the subclavian arteries. No proximal subclavian artery stenosis. Both vertebral arteries widely patent without stenosis, dissection or occlusion. Skeleton: No acute osseous finding. No discrete or worrisome osseous lesions. Other neck: No other acute soft tissue abnormality within the neck. No mass or adenopathy. Upper chest: Scattered atelectatic changes noted within the visualized lungs. Visualized upper chest demonstrates no other acute finding. Review of the MIP images confirms the above findings CTA HEAD FINDINGS Anterior circulation: Petrous segments widely patent. Mild atheromatous change within the carotid siphons without hemodynamically significant stenosis. A1 segments patent bilaterally. Normal anterior communicating artery complex. Anterior cerebral arteries patent to their distal aspects without stenosis. No M1 stenosis or occlusion. Normal MCA bifurcations. Distal MCA branches well perfused bilaterally. Posterior circulation: Both V4 segments patent to the vertebrobasilar junction, with the left being slightly dominant. Both PICA origins patent and normal. Basilar diminutive but patent to its distal aspect without stenosis. Superior cerebellar arteries patent bilaterally. Fetal type origin of the PCAs bilaterally. PCAs well perfused to their distal aspects without stenosis. Venous sinuses: Patent.  Anatomic variants: None significant. There is an apparent 7 mm blush of contrast at the posterior right temporal lobe immediately superior to the right temporal lobe hemorrhage (series 11, image 86). Finding is favored to reflect a focal venous confluence. No other AVM or other vascular abnormality seen underlying the acute intraparenchymal bleed. Review of the MIP images confirms the above findings IMPRESSION: 1. No significant interval change in size and morphology of estimated 5 cc intraparenchymal hemorrhage centered at the posterior right temporal lobe. Mildly increased localized vasogenic edema without significant regional mass effect. No other new acute intracranial abnormality. 2. 7 mm blush of contrast at the posterior right temporal lobe, immediately adjacent to the right temporal lobe hemorrhage. While this is favored to reflect a focal venous confluence, confirmation with dedicated catheter directed angiography suggested for confirmatory purposes given  its close proximity to the adjacent bleed. 3. Mild atherosclerotic change about the carotid bifurcations and carotid siphons without hemodynamically significant stenosis. 4. Otherwise negative CTA of the head and neck. No large vessel occlusion, hemodynamically significant stenosis, or other acute vascular abnormality. Electronically Signed   By: Jeannine Boga M.D.   On: 06/25/2020 19:47   MR BRAIN W WO CONTRAST  Result Date: 06/26/2020 CLINICAL DATA:  Cerebral hemorrhage suspected. EXAM: MRI HEAD WITHOUT AND WITH CONTRAST TECHNIQUE: Multiplanar, multiecho pulse sequences of the brain and surrounding structures were obtained without and with intravenous contrast. CONTRAST:  60mL GADAVIST GADOBUTROL 1 MMOL/ML IV SOLN COMPARISON:  Head CT Aug 25, 2020 FINDINGS: The study is partially degraded by motion. Brain: No significant interval change in size of the posterior right temporal lobe intraparenchymal hematoma measuring approximately 2.9 x 2.6 x 1.4  cm with mild surrounding vasogenic edema. There is effacement of the adjacent cerebral sulci without significant mass effect or midline shift. Prominent vessels are seen in the superior aspect of the hematoma (series 19, image 33) which may represent displaced leptomeningeal vessels although the possibility of a micro AVM cannot be entirely excluded. No focus of abnormal contrast enhancement identified. No acute infarct, hydrocephalus or extra-axial collection. Scattered and confluent foci of T2 hyperintensity are seen within the white matter of the cerebral hemispheres, nonspecific, most likely related to chronic microangiopathic changes. Vascular: Normal flow voids. Skull and upper cervical spine: Normal marrow signal. Sinuses/Orbits: Negative. IMPRESSION: 1. No significant interval change in size of the posterior right temporal lobe intraparenchymal hematoma measuring approximately 2.9 x 2.6 x 1.4 cm with mild surrounding vasogenic edema. No focus of abnormal contrast enhancement identified. 2. Prominent vessels in the superior aspect of the hematoma may represent displaced leptomeningeal vessels although the possibility of a micro AVM cannot be entirely excluded. Electronically Signed   By: Pedro Earls M.D.   On: 06/26/2020 08:37   ECHOCARDIOGRAM COMPLETE  Result Date: 06/25/2020    ECHOCARDIOGRAM REPORT   Patient Name:   Mathew Malone Date of Exam: 06/25/2020 Medical Rec #:  [id number]   Height:       70.0 in Accession #:    [id number]  Weight:       216.3 lb Date of Birth:  [birth year]  BSA:          2.158 m Patient Age:    57 years    BP:           154/86 mmHg Patient Gender: M           HR:           56 bpm. Exam Location:  Inpatient Procedure: 2D Echo, Cardiac Doppler and Color Doppler Indications:    Stroke  History:        Patient has no prior history of Echocardiogram examinations.                 Signs/Symptoms:Altered Mental Status.  Sonographer:    Merrie Roof RDCS Referring Phys:  [id number] [place]  1. Left ventricular ejection fraction, by estimation, is 50 to 55%. The left ventricle has low normal function. The left ventricle has no regional wall motion abnormalities. Left ventricular diastolic parameters are indeterminate.  2. Right ventricular systolic function is normal. The right ventricular size is normal.  3. Left atrial size was moderately dilated.  4. Right atrial size was mildly dilated.  5. The mitral valve is grossly normal. No evidence of mitral valve  regurgitation. No evidence of mitral stenosis.  6. The aortic valve is grossly normal. There is mild calcification of the aortic valve. Aortic valve regurgitation is not visualized. Mild aortic valve sclerosis is present, with no evidence of aortic valve stenosis.  7. The inferior vena cava is normal in size with greater than 50% respiratory variability, suggesting right atrial pressure of 3 mmHg. Comparison(s): No prior Echocardiogram. Conclusion(s)/Recommendation(s): Normal biventricular function without evidence of hemodynamically significant valvular heart disease. FINDINGS  Left Ventricle: Left ventricular ejection fraction, by estimation, is 50 to 55%. The left ventricle has low normal function. The left ventricle has no regional wall motion abnormalities. The left ventricular internal cavity size was normal in size. There is no left ventricular hypertrophy. Left ventricular diastolic parameters are indeterminate. Right Ventricle: The right ventricular size is normal. Right vetricular wall thickness was not well visualized. Right ventricular systolic function is normal. Left Atrium: Left atrial size was moderately dilated. Right Atrium: Right atrial size was mildly dilated. Pericardium: There is no evidence of pericardial effusion. Mitral Valve: The mitral valve is grossly normal. No evidence of mitral valve regurgitation. No evidence of mitral valve stenosis. Tricuspid Valve: The tricuspid valve is grossly  normal. Tricuspid valve regurgitation is not demonstrated. No evidence of tricuspid stenosis. Aortic Valve: The aortic valve is grossly normal. There is mild calcification of the aortic valve. Aortic valve regurgitation is not visualized. Mild aortic valve sclerosis is present, with no evidence of aortic valve stenosis. Aortic valve mean gradient  measures 5.0 mmHg. Aortic valve peak gradient measures 7.2 mmHg. Aortic valve area, by VTI measures 3.11 cm. Pulmonic Valve: The pulmonic valve was not well visualized. Pulmonic valve regurgitation is not visualized. Aorta: The aortic root, ascending aorta and aortic arch are all structurally normal, with no evidence of dilitation or obstruction. Venous: The inferior vena cava is normal in size with greater than 50% respiratory variability, suggesting right atrial pressure of 3 mmHg. IAS/Shunts: The atrial septum is grossly normal.  LEFT VENTRICLE PLAX 2D LVIDd:         5.00 cm  Diastology LVIDs:         3.70 cm  LV e' medial:    5.55 cm/s LV PW:         0.80 cm  LV E/e' medial:  9.2 LV IVS:        1.00 cm  LV e' lateral:   7.72 cm/s LVOT diam:     2.10 cm  LV E/e' lateral: 6.6 LV SV:         86 LV SV Index:   40 LVOT Area:     3.46 cm  RIGHT VENTRICLE          IVC RV Basal diam:  3.30 cm  IVC diam: 1.50 cm LEFT ATRIUM             Index       RIGHT ATRIUM           Index LA diam:        4.50 cm 2.09 cm/m  RA Area:     18.20 cm LA Vol (A2C):   81.4 ml 37.72 ml/m RA Volume:   48.90 ml  22.66 ml/m LA Vol (A4C):   88.4 ml 40.97 ml/m LA Biplane Vol: 88.1 ml 40.83 ml/m  AORTIC VALVE AV Area (Vmax):    3.26 cm AV Area (Vmean):   2.53 cm AV Area (VTI):     3.11 cm AV Vmax:  134.00 cm/s AV Vmean:          103.000 cm/s AV VTI:            0.275 m AV Peak Grad:      7.2 mmHg AV Mean Grad:      5.0 mmHg LVOT Vmax:         126.00 cm/s LVOT Vmean:        75.200 cm/s LVOT VTI:          0.247 m LVOT/AV VTI ratio: 0.90  AORTA Ao Root diam: 3.50 cm Ao Asc diam:  3.30 cm  MITRAL VALVE MV Area (PHT): 2.45 cm    SHUNTS MV Decel Time: 310 msec    Systemic VTI:  0.25 m MV E velocity: 51.00 cm/s  Systemic Diam: 2.10 cm MV A velocity: 74.60 cm/s MV E/A ratio:  0.68 Buford Dresser MD Electronically signed by Buford Dresser MD Signature Date/Time: 06/25/2020/4:28:31 PM    Final    CT HEAD CODE STROKE WO CONTRAST  Result Date: 06/25/2020 CLINICAL DATA:  Code stroke. Initial evaluation for acute left-sided weakness. EXAM: CT HEAD WITHOUT CONTRAST TECHNIQUE: Contiguous axial images were obtained from the base of the skull through the vertex without intravenous contrast. COMPARISON:  None. FINDINGS: Brain: Examination degraded by motion artifact. 2.9 x 2.7 x 1.3 cm acute intraparenchymal hemorrhage seen positioned at the posterior right temporal lobe (estimated volume 5 cc). Mild surrounding vasogenic edema without significant regional mass effect. No other visible acute intracranial hemorrhage. No other acute large vessel territory infarct. No mass lesion or midline shift. No hydrocephalus or extra-axial fluid collection. Underlying moderate to advanced chronic microvascular ischemic disease. Vascular: Calcified atherosclerosis at the skull base. No asymmetric hyperdense vessel. Skull: No scalp soft tissue abnormality.  Calvarium intact. Sinuses/Orbits: Globes and orbital soft tissues within normal limits. Paranasal sinuses are clear. Trace left mastoid effusion noted, of doubtful significance. Other: None. ASPECTS [facility] Stroke Program Early CT Score) Does not apply given acute intracranial hemorrhage. IMPRESSION: 1. 2.9 x 2.7 x 1.3 cm acute intraparenchymal hemorrhage centered at the posterior right temporal lobe (estimated volume 5 cc). Mild localized edema without significant regional mass effect. 2. Underlying moderate to advanced chronic microvascular ischemic disease. Critical Value/emergent results were called by telephone at the time of interpretation on 06/25/2020 at  3:50 am to provider Dr. Theda Sers, who verbally acknowledged these results. Electronically Signed   By: Jeannine Boga M.D.   On: 06/25/2020 03:53    Labs:  CBC: Recent Labs    06/25/20 0340 06/25/20 0350 06/26/20 0217  WBC  --  14.1* 9.3  HGB 18.0* 17.0 15.5  HCT 53.0* 52.9* 44.5  PLT  --  145* 102*    COAGS: Recent Labs    06/25/20 0350  INR 1.0  APTT 24    BMP: Recent Labs    06/25/20 0340 06/25/20 0350 06/26/20 0217  NA 139 140 139  K 3.1* 3.0* 3.1*  CL 100 98 103  CO2  --  24 24  GLUCOSE 201* 222* 140*  BUN 22 15 16   CALCIUM  --  9.7 9.4  CREATININE 1.00 1.12 0.99  GFRNONAA  --  >60 >60    LIVER FUNCTION TESTS: Recent Labs    06/25/20 0350  BILITOT 1.1  AST 35  ALT 23  ALKPHOS 51  PROT 7.5  ALBUMIN 4.7    TUMOR MARKERS: No results for input(s): AFPTM, CEA, CA199, CHROMGRNA in the last 8760 hours.  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 The procedure was explained to his daughter Arad Burston and she signed the consent form.   Risks and benefits of this procedure were discussed with the patient including, but not limited to bleeding, infection, vascular injury or contrast induced renal failure.  This interventional procedure involves the use of X-rays and because of the nature of the planned procedure, it is possible that we will have prolonged use of X-ray fluoroscopy.  Potential radiation risks to you include (but are not limited to) the following: - A slightly elevated risk for cancer  several years later in life. This risk is typically less than 0.5% percent. This risk is low in comparison to the normal incidence of human cancer, which is 33% for women and 50% for men according to the [place]. - Radiation induced injury can include skin redness, resembling a rash, tissue  breakdown / ulcers and hair loss (which can be temporary or permanent).   The likelihood of either of these occurring depends on the difficulty of the procedure and whether you are sensitive to radiation due to previous procedures, disease, or genetic conditions.   IF your procedure requires a prolonged use of radiation, you will be notified and given written instructions for further action.  It is your responsibility to monitor the irradiated area for the 2 weeks following the procedure and to notify your physician if you are concerned that you have suffered a radiation induced injury.    All of the patient's questions were answered, patient is agreeable to proceed.  Consent signed and in chart.  Thank you for this interesting consult.  I greatly enjoyed meeting Mathew Malone and look forward to participating in their care.  A copy of this report was sent to the requesting provider on this date.  Electronically Signed: Soyla Dryer, AGACNP-BC [phone] 06/26/2020, 9:22 AM   I spent a total of 20 Minutes    in face to face in clinical consultation, greater than 50% of which was counseling/coordinating care for image-guided diagnostic cerebral arteriogram.

## 2020-06-26 NOTE — Progress Notes (Signed)
SLP Cancellation Note  Patient Details Name: Mathew Malone MRN: [id number] DOB: [birth year]   Cancelled eval: Pt out of room for angiogram; on precedex.  Will follow for readiness for evaluation.  [name] L. Tivis Ringer, [place] Office number [phone] Pager [phone]            [name] [name] 06/26/2020, 10:39 AM

## 2020-06-26 NOTE — Progress Notes (Signed)
LTM maint complete - A1 serviced all leads in place.

## 2020-06-26 NOTE — Consult Note (Signed)
NAME:  Mathew Malone, MRN:  [id number], DOB:  1945-07-16, LOS: 1 ADMISSION DATE:  06/25/2020, CONSULTATION DATE:  06/26/20 REFERRING MD:  Amie Portland CHIEF COMPLAINT:  Bradycardia, need for sedation   Brief History:  75 year old male presented to ED via EMS after new onset of seizure with fall at home, residual L-sided weakness, R gaze deviation and aphasia. CT/MRI demonstrated R temporal intraparenchymal hemorrhage with c/f AVM (cerebral arteriogram negative). PCCM consulted for bradycardia/sedation management.  History of Present Illness:  [name] is a 75 year old male who presented to [facility] ED via EMS after he "collapsed" and fell at home with concern for seizure-like activity. At the time of the seizure, per daughter patient reportedly had left-sided weakness and slurred speech and was incontinent. He was brought to [facility] ED where CT and MRI Brain imaging demonstrated a R temporal intraparenchymal hemorrhage with concern for AVM. Patient subsequently underwent cerebral arteriogram with Neuro IR, which was grossly negative.   Patient was transferred to the ICU and Precedex was initiated for agitation; he subsequently became bradycardic to the 40s and PCCM was consulted for assistance with sedation management.  Past Medical History:   Past Medical History:  Diagnosis Date  . Arthritis    "fingers; knees; right shoulder" (04/28/2013)  . [facility]) 2008   prostate  . Exertional shortness of breath    "here lately" (04/28/2013)  . Gout   . HOH (hard of hearing)    bilateral  . Hyperlipidemia   . Hypertension    stress test- 2011  . OSA on CPAP   . Post traumatic stress disorder (PTSD)    Significant Hospital Events:  3/06 - Admitted 3/07 - PCCM consult for bradycardia in the setting of Precedex initiation  Consults:  PCCM  Procedures:    Significant Diagnostic Tests:   3/6 CT Head Code Stroke >> 2.9 x 2.7 x 1.3 cm acute intraparenchymal hemorrhage centered at the posterior R  temporal lobe (estimated volume 5 cc), mild localized edema without significant regional mass effect, underlying moderate to advanced chronic microvascular ischemic disease.  3/6 CT Angio Head/Neck >> no significant interval change in size/morphology of estimated 5cc intraparenchymal hemorrhage centered at the posterior R temporal lobe, mildly increased localized vasogenic edema without significant regional mass effect, no other new acute intracranial abnormality; 50mm blush of contrast at posterior R temporal lobe favored to reflect a focal venous confluence, mild atherosclerotic change at carotid bifurcations, otherwise negative - no large vessel occlusion or hemodynamically significant stenosis  3/7 MRI Brain w/ w/o contrast >> no significant interval change in the size of the posterior R temporal lobe intraparenchymal hematoma measuring 2.9 x 2.6 x 1.4cm with mild surrounding vasogenic edema, no focus of abnormal contrast enhancement identified; prominent vessels in the superior aspect of the hematoma, cannot exclude micro AVM  3/7 Cerebral Arteriogram (4 Vessel) >> Marred by motion artifact, grossly no evidence of aneurysm, AV shunting, DAVF, extracranial/intracranial dissections or of occlusions/stenosis; venous outflow intact  Micro Data:  3/6 COVID negative 3/6 MRSA PCR negative  Antimicrobials:    Interim History / Subjective:    Objective   Blood pressure 114/67, pulse (!) 50, temperature (!) 97.5 F (36.4 C), temperature source Axillary, resp. rate 20, height 5\' 10"  (1.778 m), weight 98.1 kg, SpO2 99 %.        Intake/Output Summary (Last 24 hours) at 06/26/2020 1655 Last data filed at 06/26/2020 1200 Gross per 24 hour  Intake 2270.92 ml  Output 700 ml  Net 1570.92  ml   Filed Weights   06/25/20 0300  Weight: 98.1 kg    Examination: General: Critically ill-appearing gentleman, laying in bed, in NAD. HEENT: [place]/AT, anicteric sclera, moist mucous membranes. Tongue with mild  laceration, likely from seizure/fall. Neuro: Awake/alert, oriented x 2. Speaking in full sentences, answering questions appropriately. Follows commands. Moves all 4 extremities spontaneously. CV: S1S2, mildly bradycardic, regular rhythm, no m/g/r. PULM: Breathing even and unlabored on 2L [place]. Lung fields CTAB anteriorly. GI: Soft, nontender, nondistended. Extremities: No LE edema noted. Skin: Warm/dry, no rashes noted.  Resolved Hospital Problem list     Assessment & Plan:  Acute right temporal intraparenchymal hemorrhage without AVM c/b seizure Acute encephalopathy in the setting of ICH Presented 3/6 via EMS for fall/seizure at home. CT/MRI Brain demonstrated R temporal ICH (as above). S/p Neuro IR cerebral arteriogram which was grossly normal without AVM noted. - Neurology primary - Continue sedation (Precedex) as BP/HR tolerates - Neuroprotective measures: HOB > 30 degrees, normoglycemia - Goal SBP < 140 today, likely liberalize to < 160 tomorrow 3/8 per Neuro - LTM EEG in place, continue to monitor - Continue scheduled Keppra, Ativan PRN  Bradycardia likely in the setting of sedation Bradycardia into the 40s noted on initiation of Precedex for agitation. PCCM consulted for management of sedation. - Wean Precedex as able, max 0.36mcg/kg/hr - Continue telemetry - Consider alternative agent if patient remains bradycardic  Hypertension History of hypertension with SBPs reportedly > 200 on EMS arrival. Home regimen includes Coreg, Lisinopril, HCTZ. - Continue Cleviprex gtt for now - Goal SBP < 140 today, 3/7   Hypokalemia - Supplement PRN for K > 4  History of PTSD - Continue home Abilify, Zoloft  Best practice (evaluated daily)  Diet: NPO Pain/Anxiety/Delirium protocol (if indicated): Precedex VAP protocol (if indicated): N/A DVT prophylaxis: Per Neuro, hold in the setting of active ICH GI prophylaxis: PPI Glucose control: SSI Mobility: Bedrest Disposition: ICU  Goals  of Care:  Last date of multidisciplinary goals of care discussion: Per primary team Family and staff present:  Summary of discussion:  Follow up goals of care discussion due: 3/14 Code Status: Full  Labs   CBC: Recent Labs  Lab 06/25/20 0340 06/25/20 0350 06/26/20 0217  WBC  --  14.1* 9.3  NEUTROABS  --  10.3*  --   HGB 18.0* 17.0 15.5  HCT 53.0* 52.9* 44.5  MCV  --  89.5 84.1  PLT  --  145* 102*    Basic Metabolic Panel: Recent Labs  Lab 06/25/20 0340 06/25/20 0350 06/25/20 0800 06/26/20 0217  NA 139 140  --  139  K 3.1* 3.0*  --  3.1*  CL 100 98  --  103  CO2  --  24  --  24  GLUCOSE 201* 222*  --  140*  BUN 22 15  --  16  CREATININE 1.00 1.12  --  0.99  CALCIUM  --  9.7  --  9.4  MG  --   --  1.9  --   PHOS  --   --  2.5  --    GFR: Estimated Creatinine Clearance: 79.2 mL/min (by C-G formula based on SCr of 0.99 mg/dL). Recent Labs  Lab 06/25/20 0350 06/26/20 0217  WBC 14.1* 9.3    Liver Function Tests: Recent Labs  Lab 06/25/20 0350  AST 35  ALT 23  ALKPHOS 51  BILITOT 1.1  PROT 7.5  ALBUMIN 4.7   No results for input(s): LIPASE, AMYLASE  in the last 168 hours. No results for input(s): AMMONIA in the last 168 hours.  ABG    Component Value Date/Time   HCO3 29.3 (H) 05/06/2012 1508   TCO2 27 06/25/2020 0340   O2SAT 45.0 05/06/2012 1508     Coagulation Profile: Recent Labs  Lab 06/25/20 0350  INR 1.0    Cardiac Enzymes: No results for input(s): CKTOTAL, CKMB, CKMBINDEX, TROPONINI in the last 168 hours.  HbA1C: Hgb A1c MFr Bld  Date/Time Value Ref Range Status  06/25/2020 08:00 AM 5.6 4.8 - 5.6 % Final    Comment:    (NOTE) Pre diabetes:          5.7%-6.4%  Diabetes:              >6.4%  Glycemic control for   <7.0% adults with diabetes     CBG: Recent Labs  Lab 06/26/20 0042 06/26/20 0426 06/26/20 0749 06/26/20 1208 06/26/20 1537  GLUCAP 137* 152* 141* 135* 115*    Review of Systems:   Patient is  encephalopathic and/or intubated. Therefore history has been obtained from chart review.   Past Medical History:  He,  has a past medical history of Arthritis, Cancer ([place]) (2008), Exertional shortness of breath, Gout, HOH (hard of hearing), Hyperlipidemia, Hypertension, OSA on CPAP, and Post traumatic stress disorder (PTSD).   Surgical History:   Past Surgical History:  Procedure Laterality Date  . INGUINAL HERNIA REPAIR Right 1970's  . JOINT REPLACEMENT    . LEFT HEART CATHETERIZATION WITH CORONARY ANGIOGRAM N/A 04/29/2013   Procedure: LEFT HEART CATHETERIZATION WITH CORONARY ANGIOGRAM;  Surgeon: Laverda Page, MD;  Location: [facility] CATH LAB;  Service: Cardiovascular;  Laterality: N/A;  . PENILE PROSTHESIS IMPLANT  04/05/2011   Procedure: PENILE PROTHESIS INFLATABLE;  Surgeon: Claybon Jabs, MD;  Location: [facility];  Service: Urology;  Laterality: N/A;   EXPLORATION AND REVISION OF INFLATABLE PENILE   PROSTHESIS    PUMP  . PENILE PROSTHESIS IMPLANT  10/21/2011   Procedure: PENILE PROTHESIS INFLATABLE;  Surgeon: Claybon Jabs, MD;  Location: [facility];  Service: Urology;  Laterality: N/A;     . PENILE PROSTHESIS PLACEMENT  12/2010  . ROBOT ASSISTED LAPAROSCOPIC RADICAL PROSTATECTOMY  2008  . SHOULDER OPEN ROTATOR CUFF REPAIR Bilateral 2007-2008   "twice on each side"  . TONSILLECTOMY    . TOTAL KNEE ARTHROPLASTY Bilateral 2011-2012   lt knee-2012  / rt knee -2011     Social History:   reports that he quit smoking about 49 years ago. His smoking use included cigarettes. He has a 0.48 pack-year smoking history. He has never used smokeless tobacco. He reports current alcohol use of about 1.0 standard drink of alcohol per week. He reports current drug use. Drug: Marijuana.   Family History:  His family history is not on file.   Allergies No Known Allergies   Home Medications  Prior to Admission medications   Medication Sig Start Date End Date Taking?  Authorizing Provider  acetaminophen (TYLENOL) 500 MG tablet Take 1,000 mg by mouth every 6 (six) hours as needed for mild pain.   Yes [provider]  allopurinol (ZYLOPRIM) 100 MG tablet Take 200 mg by mouth daily.   Yes [provider]  ascorbic acid (VITAMIN C) 500 MG tablet Take 500 mg by mouth 2 (two) times daily.   Yes [provider]  aspirin 81 MG chewable tablet Chew 162 mg by mouth at bedtime.  Yes [provider]  calcium-vitamin D (OSCAL WITH D) 500-200 MG-UNIT per tablet Take 1 tablet by mouth daily. 04/29/13  Yes Adrian Prows, MD  [place] 450 MG CAPS Take 450 mg by mouth daily.   Yes [provider]  cetirizine (ZYRTEC) 10 MG tablet Take 10 mg by mouth daily.   Yes [provider]  DHA-Vitamin C-Lutein (EYE HEALTH FORMULA PO) Take 470 mg by mouth daily. Eye bright tab   Yes [provider]  gabapentin (NEURONTIN) 600 MG tablet Take 1,200 mg by mouth 3 (three) times daily.   Yes [provider]  galantamine (RAZADYNE ER) 8 MG 24 hr capsule Take 8 mg by mouth daily with breakfast.   Yes [provider]  hydrochlorothiazide (HYDRODIURIL) 25 MG tablet Take 12.5 mg by mouth daily. 05/22/12 12/06/20 Yes [provider]  ibuprofen (ADVIL) 800 MG tablet Take 800 mg by mouth every 8 (eight) hours as needed. 06/06/20  Yes [provider]  Iron-Vitamins (GERITOL) LIQD Take 1 Dose by mouth daily.   Yes [provider]  lisinopril (ZESTRIL) 40 MG tablet Take 40 mg by mouth daily.   Yes [provider]  mirabegron ER (MYRBETRIQ) 50 MG TB24 tablet Take 50 mg by mouth daily.   Yes [provider]  Misc Natural Products (TOTAL MEMORY & FOCUS FORMULA PO) Take 1 tablet by mouth daily.   Yes [provider]  OVER THE COUNTER MEDICATION Take 1 tablet by mouth daily. [place] energizer   Yes [provider]  PEG-KCl-NaCl-NaSulf-Na Asc-C (PLENVU) 140 g SOLR Take  140 g by mouth See admin instructions. As directed for bowel prep 05/25/20  Yes [provider]  polyvinyl alcohol (LIQUIFILM TEARS) 1.4 % ophthalmic solution Place into both eyes 2 (two) times daily as needed for dry eyes.   Yes [provider]  sertraline (ZOLOFT) 100 MG tablet Take 1 tablet (100 mg total) by mouth 2 (two) times daily. Patient taking differently: Take 100 mg by mouth daily. 04/29/13  Yes Adrian Prows, MD  simvastatin (ZOCOR) 40 MG tablet Take 20 mg by mouth daily.   Yes [provider]  ARIPiprazole (ABILIFY) 30 MG tablet Take 30 mg by mouth daily.    [provider]  carvedilol (COREG) 6.25 MG tablet Take 1 tablet (6.25 mg total) by mouth 2 (two) times daily with a meal. Patient not taking: No sig reported 04/29/13   Adrian Prows, MD  HYDROcodone-acetaminophen (VICODIN) 5-500 MG per tablet Take 1 tablet by mouth every 6 (six) hours as needed. For pain Patient not taking: No sig reported 04/29/13   Adrian Prows, MD  Phentermine-Topiramate (QSYMIA) 7.5-46 MG CP24 Take 1 capsule by mouth daily. Take 1 capsule by mouth daily start day 15 after 14 day course. Patient not taking: No sig reported 04/29/13   Adrian Prows, MD     Critical care time: 68 minutes   Lestine Mount, PA-C [facility] 06/26/20 4:56 PM  Please see [email or web address] for pager details.

## 2020-06-26 NOTE — Progress Notes (Signed)
PT Cancellation Note  Patient Details Name: Mathew Malone MRN: [id number] DOB: 1945-12-27   Cancelled Treatment:    Reason Eval/Treat Not Completed: Medical issues which prohibited therapy  Spoke with RN and she reported pt going for angiogram soon and on precedex and they want him to stay calm before procedure. Will re-attempt eval at later date.   Arby Barrette, PT Pager [phone]   Rexanne Mano 06/26/2020, 10:31 AM

## 2020-06-26 NOTE — Progress Notes (Shared)
EEG attempted, Pt is going for angiogram first

## 2020-06-26 NOTE — Progress Notes (Signed)
OT Cancellation Note  Patient Details Name: Mathew Malone MRN: [id number] DOB: 05/08/1945   Cancelled Treatment:    Reason Eval/Treat Not Completed: Medical issues which prohibited therapy. Spoke with RN and she reported pt going for angiogram soon and on precedex and they want him to stay calm before procedure. Will re-attempt eval at later date.  Mathew Malone, OTR/L Acute Rehab Services Pager [phone] Office [phone]     Almon Register 06/26/2020, 10:10 AM

## 2020-06-26 NOTE — Progress Notes (Signed)
vLTM EEG started following spot EEG (same leads) notified Neuro

## 2020-06-27 ENCOUNTER — Inpatient Hospital Stay (HOSPITAL_COMMUNITY): Payer: Medicare Other

## 2020-06-27 DIAGNOSIS — I619 Nontraumatic intracerebral hemorrhage, unspecified: Secondary | ICD-10-CM

## 2020-06-27 DIAGNOSIS — R410 Disorientation, unspecified: Secondary | ICD-10-CM | POA: Diagnosis not present

## 2020-06-27 LAB — BASIC METABOLIC PANEL
Anion gap: 9 (ref 5–15)
BUN: 15 mg/dL (ref 8–23)
CO2: 25 mmol/L (ref 22–32)
Calcium: 9.2 mg/dL (ref 8.9–10.3)
Chloride: 105 mmol/L (ref 98–111)
Creatinine, Ser: 0.84 mg/dL (ref 0.61–1.24)
GFR, Estimated: >60 mL/min (ref >60)
Glucose, Bld: 119 mg/dL — ABNORMAL HIGH (ref 70–99)
Potassium: 3 mmol/L — ABNORMAL LOW (ref 3.5–5.1)
Sodium: 139 mmol/L (ref 135–145)

## 2020-06-27 LAB — CBC
HCT: 41.6 % (ref 39.0–52.0)
Hemoglobin: 14.6 g/dL (ref 13.0–17.0)
MCH: 29.7 pg (ref 26.0–34.0)
MCHC: 35.1 g/dL (ref 30.0–36.0)
MCV: 84.7 fL (ref 80.0–100.0)
Platelets: 95 10*3/uL — ABNORMAL LOW (ref 150–400)
RBC: 4.91 MIL/uL (ref 4.22–5.81)
RDW: 13.3 % (ref 11.5–15.5)
WBC: 7.6 10*3/uL (ref 4.0–10.5)
nRBC: 0.3 % — ABNORMAL HIGH (ref 0.0–0.2)

## 2020-06-27 LAB — GLUCOSE, CAPILLARY
Glucose-Capillary: 115 mg/dL — ABNORMAL HIGH (ref 70–99)
Glucose-Capillary: 128 mg/dL — ABNORMAL HIGH (ref 70–99)
Glucose-Capillary: 133 mg/dL — ABNORMAL HIGH (ref 70–99)
Glucose-Capillary: 119 mg/dL — ABNORMAL HIGH (ref 70–99)
Glucose-Capillary: 142 mg/dL — ABNORMAL HIGH (ref 70–99)
Glucose-Capillary: 122 mg/dL — ABNORMAL HIGH (ref 70–99)

## 2020-06-27 LAB — MAGNESIUM: Magnesium: 1.9 mg/dL (ref 1.7–2.4)

## 2020-06-27 MED ORDER — CLONAZEPAM 0.5 MG PO TABS
0.5000 mg | ORAL_TABLET | Freq: Two times a day (BID) | ORAL | Status: DC
  Administered 2020-06-27 – 2020-06-28 (×4): 0.5 mg via ORAL
  Filled 2020-06-27 (×4): qty 1

## 2020-06-27 MED ORDER — POTASSIUM CHLORIDE CRYS ER 20 MEQ PO TBCR
20.0000 meq | EXTENDED_RELEASE_TABLET | ORAL | Status: AC
Start: 2020-06-27 — End: 2020-06-27
  Administered 2020-06-27 (×3): 20 meq via ORAL
  Filled 2020-06-27 (×3): qty 1

## 2020-06-27 MED ORDER — POTASSIUM CHLORIDE 10 MEQ/100ML IV SOLN
10.0000 meq | INTRAVENOUS | Status: DC
  Administered 2020-06-27 (×2): 10 meq via INTRAVENOUS
  Filled 2020-06-27 (×2): qty 100

## 2020-06-27 MED ORDER — LEVETIRACETAM 500 MG PO TABS
1500.0000 mg | ORAL_TABLET | Freq: Two times a day (BID) | ORAL | Status: DC
  Administered 2020-06-27 – 2020-07-01 (×8): 1500 mg via ORAL
  Filled 2020-06-27 (×8): qty 3

## 2020-06-27 MED ORDER — LISINOPRIL 20 MG PO TABS
40.0000 mg | ORAL_TABLET | Freq: Every day | ORAL | Status: DC
  Administered 2020-06-27 – 2020-07-01 (×5): 40 mg via ORAL
  Filled 2020-06-27 (×5): qty 2

## 2020-06-27 NOTE — Progress Notes (Signed)
PT Evaluation   06/27/20 1100  PT Visit Information  Last PT Received On 06/27/20  Assistance Needed +2  History of Present Illness Mathew Malone is a 75 y.o. male walked downstairs and collapsed, incontinent, with left side not moving but then started moving LLE. 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  PMHx: HTN, PTSD, HOH  Precautions  Precautions Fall  Restrictions  Weight Bearing Restrictions No  Home Living  Family/patient expects to be discharged to: Private residence  Living Arrangements Children  Available Help at Discharge  (lives alone in [place] - was visiting here)  Type of [place] to enter  Entrance Stairs-Number of Steps 1  [place] Two level;Able to live on main level with bedroom/bathroom  Bathroom Shower/Tub Walk-in shower  [place] bars - toilet;Grab bars - tub/shower;Cane - single point (walking sticks)  Prior Function  Level of Independence Independent with assistive device(s);Needs assistance  Gait / Transfers Assistance Needed used cane long distances per pt  ADL's / [place] Needed No assist  Communication  Communication Other (comment) (emotional lability noted)  Pain Assessment  Pain Assessment No/denies pain  Cognition  Arousal/Alertness Awake/alert  Behavior During Therapy Impulsive  Overall Cognitive Status Impaired/Different from baseline  Area of Impairment Following commands;Safety/judgement;Awareness  Following Commands Follows one step commands consistently  Safety/Judgement Decreased awareness of safety;Decreased awareness of deficits  Upper Extremity Assessment  Upper Extremity Assessment Defer to OT evaluation  Lower Extremity Assessment  Lower Extremity Assessment Generalized weakness  Cervical / Trunk Assessment  Cervical / Trunk Assessment Normal  Bed Mobility  Overal bed mobility  Needs Assistance  Bed Mobility Supine to Sit  Supine to sit Supervision  General bed mobility comments S for safety as pt is impulsive with all movements.  Transfers  Overall transfer level Needs assistance  Equipment used Rolling walker (2 wheeled)  Transfers Sit to/from [facility]  Sit to Stand Min assist  Stand pivot transfers Min assist;Mod assist;+2 safety/equipment  General transfer comment Pt needed slight power up assist due to dizziness and instability upon standing.  Pt stood and pivoted to the recliner with min to mod assist for stability. Pt impulsive with movements as well.  Once pt sat down, pt was emotional and crying about his deficits and took incr time to calm him.  Daughters present.  Ambulation/Gait  General Gait Details TBA  Modified Rankin (Stroke Patients Only)  Pre-Morbid Rankin Score 1  Modified Rankin 4  Balance  Overall balance assessment Needs assistance  Sitting-balance support No upper extremity supported;Feet supported  Sitting balance-Leahy Scale Fair  Sitting balance - Comments Pt can sit with min guard assist on EOB but needs close guard assist due to impulsivity.  Dizziness also reported.  Standing balance support Bilateral upper extremity supported;During functional activity  Standing balance-Leahy Scale Poor  Standing balance comment Pt was able to stand with min to mod assist for balance with UE support and RW.  General Comments  General comments (skin integrity, edema, etc.) 145/86, 72 bpm, 100% RA, 180/74  PT - End of Session  Equipment Utilized During Treatment Gait belt  Activity Tolerance Patient tolerated treatment well  Patient left in chair;with call bell/phone within reach;with chair alarm set  Nurse Communication Mobility status  PT Assessment  PT Recommendation/Assessment Patient needs continued PT services  PT Visit Diagnosis Muscle weakness (generalized) (M62.81);Unsteadiness on feet (R26.81);Dizziness and giddiness  (  R42)  PT Problem List Decreased activity tolerance;Decreased balance;Decreased mobility;Decreased safety awareness  Barriers to Discharge Decreased caregiver support  PT Plan  PT Frequency (ACUTE ONLY) Min 4X/week  PT Treatment/Interventions (ACUTE ONLY) DME instruction;Gait training;Stair training;Functional mobility training;Therapeutic activities;Therapeutic exercise;Balance training;Patient/family education  AM-PAC PT "6 Clicks" Mobility Outcome Measure (Version 2)  Help needed turning from your back to your side while in a flat bed without using bedrails? 4  Help needed moving from lying on your back to sitting on the side of a flat bed without using bedrails? 4  Help needed moving to and from a bed to a chair (including a wheelchair)? 2  Help needed standing up from a chair using your arms (e.g., wheelchair or bedside chair)? 2  Help needed to walk in hospital room? 2  Help needed climbing 3-5 steps with a railing?  1  6 Click Score 15  Consider Recommendation of Discharge To: CIR/SNF/LTACH  PT Recommendation  Recommendations for Other Services Rehab consult  Follow Up Recommendations CIR;Supervision - Intermittent  PT equipment  (TBA)  Individuals Consulted  Consulted and Agree with Results and Recommendations Patient;Family member/caregiver  Family Member Consulted daughters  Acute Rehab PT Goals  Patient Stated Goal to return to [facility]  PT Goal Formulation With patient  Time For Goal Achievement 07/11/20  Potential to Achieve Goals Good  PT Time Calculation  PT Start Time (ACUTE ONLY) 1031  PT Stop Time (ACUTE ONLY) 1110  PT Time Calculation (min) (ACUTE ONLY) 39 min  PT General Charges  $$ ACUTE PT VISIT 1 Visit  PT Evaluation  $PT Eval Moderate Complexity 1 Mod  PT Treatments  $Gait Training 8-22 mins  $Therapeutic Activity 8-22 mins  Pt admitted with above diagnosis. Pt with impulsivity and dizzinesss affecting his ability to mobilize safely.  Pt would benefit from  Rehab as he wants to go home to [facility].  Will follow acutely.  Pt currently with functional limitations due to the deficits listed below (see PT Problem List). Pt will benefit from skilled PT to increase their independence and safety with mobility to allow discharge to the venue listed below.    Dawn M,PT Acute REhab Services [phone]

## 2020-06-27 NOTE — Progress Notes (Signed)
Patient's daughter Aldona Lento discussed with me their desire to send their father to inpatient rehab. She would prefer a facility that could possibly incorporate time outside. They are concerned that due to their father's PTSD that he would not do well in enclosed environments for short term or long term. This RN has reached out to case management and social work to help aid in this transition of care.

## 2020-06-27 NOTE — Progress Notes (Signed)
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 Patient awake and alert sitting in bed taking AM medications. No complaints. Two daughters and two RNs at bedside. Can spontaneously move all extremities. Right femoral puncture site c/d/i.   Allergies: Patient has no known allergies.  Medications: Prior to Admission medications   Medication Sig Start Date End Date Taking? Authorizing Provider  acetaminophen (TYLENOL) 500 MG tablet Take 1,000 mg by mouth every 6 (six) hours as needed for mild pain.   Yes [provider]  allopurinol (ZYLOPRIM) 100 MG tablet Take 200 mg by mouth daily.   Yes [provider]  ascorbic acid (VITAMIN C) 500 MG tablet Take 500 mg by mouth 2 (two) times daily.   Yes [provider]  aspirin 81 MG chewable tablet Chew 162 mg by mouth at bedtime.   Yes [provider]  calcium-vitamin D (OSCAL WITH D) 500-200 MG-UNIT per tablet Take 1 tablet by mouth daily. 04/29/13  Yes Adrian Prows, MD  [place] 450 MG CAPS Take 450 mg by mouth daily.   Yes [provider]  cetirizine (ZYRTEC) 10 MG tablet Take 10 mg by mouth daily.   Yes [provider]  DHA-Vitamin C-Lutein (EYE HEALTH FORMULA PO) Take 470 mg by mouth daily. Eye bright tab   Yes [provider]  gabapentin (NEURONTIN) 600 MG tablet Take 1,200 mg by mouth 3 (three) times daily.   Yes [provider]  galantamine (RAZADYNE ER) 8 MG 24 hr capsule Take 8 mg by mouth daily with breakfast.   Yes [provider]  hydrochlorothiazide (HYDRODIURIL) 25 MG tablet Take 12.5 mg by mouth daily. 05/22/12 12/06/20 Yes [provider]  ibuprofen (ADVIL) 800 MG tablet Take 800 mg by mouth every 8 (eight) hours as needed. 06/06/20  Yes [provider]  Iron-Vitamins (GERITOL) LIQD Take 1 Dose by mouth daily.   Yes [provider]  lisinopril (ZESTRIL) 40 MG tablet Take 40 mg by mouth daily.   Yes [provider]  mirabegron ER (MYRBETRIQ) 50 MG TB24 tablet Take 50 mg by mouth daily.   Yes [provider]  Misc Natural Products (TOTAL MEMORY & FOCUS FORMULA PO) Take 1 tablet by mouth daily.   Yes [provider]  OVER THE COUNTER MEDICATION Take 1 tablet by mouth daily. [place] energizer   Yes [provider]  PEG-KCl-NaCl-NaSulf-Na Asc-C (PLENVU) 140 g SOLR Take 140 g by mouth See admin instructions. As directed for bowel prep 05/25/20  Yes [provider]  polyvinyl alcohol (LIQUIFILM TEARS) 1.4 % ophthalmic solution Place into both eyes 2 (two) times daily as needed for dry eyes.   Yes [provider]  sertraline (ZOLOFT) 100 MG tablet Take 1 tablet (100 mg total) by mouth 2 (two) times daily. Patient taking differently: Take 100 mg by mouth daily. 04/29/13  Yes Adrian Prows, MD  simvastatin (ZOCOR) 40 MG tablet Take 20 mg by mouth daily.   Yes [provider]  ARIPiprazole (ABILIFY) 30 MG tablet Take 30 mg by mouth daily.    [provider]  carvedilol (COREG) 6.25 MG tablet Take 1 tablet (6.25 mg total) by mouth 2 (two) times daily with a meal. Patient not  taking: No sig reported 04/29/13   Adrian Prows, MD  HYDROcodone-acetaminophen (VICODIN) 5-500 MG per tablet Take 1 tablet by mouth every 6 (six) hours as needed. For pain Patient not taking: No sig reported 04/29/13   Adrian Prows, MD  Phentermine-Topiramate (QSYMIA) 7.5-46 MG CP24 Take 1 capsule by mouth daily. Take 1 capsule by mouth daily start day 15 after 14 day course. Patient not taking: No sig reported 04/29/13   Adrian Prows, MD     Vital Signs: BP 131/68 (BP Location: Left  Arm)   Pulse (!) 55   Temp 98.3 F (36.8 C) (Oral)   Resp 20   Ht 5\' 10"  (1.778 m)   Wt 216 lb 4.3 oz (98.1 kg)   SpO2 98%   BMI 31.03 kg/m   Physical Exam Vitals and nursing note reviewed.  Constitutional:      General: He is not in acute distress. Pulmonary:     Effort: Pulmonary effort is normal. No respiratory distress.  Skin:    General: Skin is warm and dry.     Comments: Right femoral puncture site soft without active bleeding or hematoma.  Neurological:     Mental Status: He is alert.     Comments: Alert, awake, and oriented x3. Speech and comprehension intact. PERRL bilaterally. Can spontaneously move all extremities. Distal pulses (DPs) 1+ bilaterally.     Imaging: CT ANGIO HEAD W OR WO CONTRAST  Result Date: 06/25/2020 CLINICAL DATA:  Follow-up acute intracranial hemorrhage. EXAM: CT ANGIOGRAPHY HEAD AND NECK TECHNIQUE: Multidetector CT imaging of the head and neck was performed using the standard protocol during bolus administration of intravenous contrast. Multiplanar CT image reconstructions and MIPs were obtained to evaluate the vascular anatomy. Carotid stenosis measurements (when applicable) are obtained utilizing NASCET criteria, using the distal internal carotid diameter as the denominator. CONTRAST:  35mL OMNIPAQUE IOHEXOL 350 MG/ML SOLN COMPARISON:  Prior head CT from earlier the same day. FINDINGS: CT HEAD FINDINGS Brain: No significant interval change in size and morphology of estimated 5 cc intraparenchymal hemorrhage centered at the posterior right temporal lobe. Mildly increased localized vasogenic edema without significant regional mass effect. No other new acute intracranial hemorrhage. No other acute large vessel territory infarct. No mass lesion or midline shift. No hydrocephalus or extra-axial fluid collection. Chronic microvascular ischemic disease again noted. Vascular: No hyperdense vessel. Scattered vascular calcifications noted within the carotid  siphons. Skull: Scalp soft tissues and calvarium demonstrate no new finding. Sinuses: Paranasal sinuses remain largely clear. Trace left mastoid effusion noted. Orbits: Globes and orbital soft tissues within normal limits. CTA NECK FINDINGS Aortic arch: Visualized arch normal in caliber with normal 3 vessel morphology. Mild plaque about the arch with no hemodynamically significant stenosis about the origin the great vessels. Right carotid system: Right common and internal carotid arteries widely patent without stenosis, dissection or occlusion. Minimal plaque about the right bifurcation for age. Left carotid system: Left common and internal carotid arteries widely patent without stenosis, dissection or occlusion. Minimal plaque about the left bifurcation for age. Vertebral arteries: Both vertebral arteries arise from the subclavian arteries. No proximal subclavian artery stenosis. Both vertebral arteries widely patent without stenosis, dissection or occlusion. Skeleton: No acute osseous finding. No discrete or worrisome osseous lesions. Other neck: No other acute soft tissue abnormality within the neck. No mass or adenopathy. Upper chest: Scattered atelectatic changes noted within the visualized lungs. Visualized upper chest demonstrates no other acute finding. Review of the MIP images confirms the above findings  CTA HEAD FINDINGS Anterior circulation: Petrous segments widely patent. Mild atheromatous change within the carotid siphons without hemodynamically significant stenosis. A1 segments patent bilaterally. Normal anterior communicating artery complex. Anterior cerebral arteries patent to their distal aspects without stenosis. No M1 stenosis or occlusion. Normal MCA bifurcations. Distal MCA branches well perfused bilaterally. Posterior circulation: Both V4 segments patent to the vertebrobasilar junction, with the left being slightly dominant. Both PICA origins patent and normal. Basilar diminutive but patent to  its distal aspect without stenosis. Superior cerebellar arteries patent bilaterally. Fetal type origin of the PCAs bilaterally. PCAs well perfused to their distal aspects without stenosis. Venous sinuses: Patent. Anatomic variants: None significant. There is an apparent 7 mm blush of contrast at the posterior right temporal lobe immediately superior to the right temporal lobe hemorrhage (series 11, image 86). Finding is favored to reflect a focal venous confluence. No other AVM or other vascular abnormality seen underlying the acute intraparenchymal bleed. Review of the MIP images confirms the above findings IMPRESSION: 1. No significant interval change in size and morphology of estimated 5 cc intraparenchymal hemorrhage centered at the posterior right temporal lobe. Mildly increased localized vasogenic edema without significant regional mass effect. No other new acute intracranial abnormality. 2. 7 mm blush of contrast at the posterior right temporal lobe, immediately adjacent to the right temporal lobe hemorrhage. While this is favored to reflect a focal venous confluence, confirmation with dedicated catheter directed angiography suggested for confirmatory purposes given its close proximity to the adjacent bleed. 3. Mild atherosclerotic change about the carotid bifurcations and carotid siphons without hemodynamically significant stenosis. 4. Otherwise negative CTA of the head and neck. No large vessel occlusion, hemodynamically significant stenosis, or other acute vascular abnormality. Electronically Signed   By: Jeannine Boga M.D.   On: 06/25/2020 19:47   CT ANGIO NECK W OR WO CONTRAST  Result Date: 06/25/2020 CLINICAL DATA:  Follow-up acute intracranial hemorrhage. EXAM: CT ANGIOGRAPHY HEAD AND NECK TECHNIQUE: Multidetector CT imaging of the head and neck was performed using the standard protocol during bolus administration of intravenous contrast. Multiplanar CT image reconstructions and MIPs were  obtained to evaluate the vascular anatomy. Carotid stenosis measurements (when applicable) are obtained utilizing NASCET criteria, using the distal internal carotid diameter as the denominator. CONTRAST:  80mL OMNIPAQUE IOHEXOL 350 MG/ML SOLN COMPARISON:  Prior head CT from earlier the same day. FINDINGS: CT HEAD FINDINGS Brain: No significant interval change in size and morphology of estimated 5 cc intraparenchymal hemorrhage centered at the posterior right temporal lobe. Mildly increased localized vasogenic edema without significant regional mass effect. No other new acute intracranial hemorrhage. No other acute large vessel territory infarct. No mass lesion or midline shift. No hydrocephalus or extra-axial fluid collection. Chronic microvascular ischemic disease again noted. Vascular: No hyperdense vessel. Scattered vascular calcifications noted within the carotid siphons. Skull: Scalp soft tissues and calvarium demonstrate no new finding. Sinuses: Paranasal sinuses remain largely clear. Trace left mastoid effusion noted. Orbits: Globes and orbital soft tissues within normal limits. CTA NECK FINDINGS Aortic arch: Visualized arch normal in caliber with normal 3 vessel morphology. Mild plaque about the arch with no hemodynamically significant stenosis about the origin the great vessels. Right carotid system: Right common and internal carotid arteries widely patent without stenosis, dissection or occlusion. Minimal plaque about the right bifurcation for age. Left carotid system: Left common and internal carotid arteries widely patent without stenosis, dissection or occlusion. Minimal plaque about the left bifurcation for age. Vertebral arteries: Both vertebral arteries  arise from the subclavian arteries. No proximal subclavian artery stenosis. Both vertebral arteries widely patent without stenosis, dissection or occlusion. Skeleton: No acute osseous finding. No discrete or worrisome osseous lesions. Other neck: No  other acute soft tissue abnormality within the neck. No mass or adenopathy. Upper chest: Scattered atelectatic changes noted within the visualized lungs. Visualized upper chest demonstrates no other acute finding. Review of the MIP images confirms the above findings CTA HEAD FINDINGS Anterior circulation: Petrous segments widely patent. Mild atheromatous change within the carotid siphons without hemodynamically significant stenosis. A1 segments patent bilaterally. Normal anterior communicating artery complex. Anterior cerebral arteries patent to their distal aspects without stenosis. No M1 stenosis or occlusion. Normal MCA bifurcations. Distal MCA branches well perfused bilaterally. Posterior circulation: Both V4 segments patent to the vertebrobasilar junction, with the left being slightly dominant. Both PICA origins patent and normal. Basilar diminutive but patent to its distal aspect without stenosis. Superior cerebellar arteries patent bilaterally. Fetal type origin of the PCAs bilaterally. PCAs well perfused to their distal aspects without stenosis. Venous sinuses: Patent. Anatomic variants: None significant. There is an apparent 7 mm blush of contrast at the posterior right temporal lobe immediately superior to the right temporal lobe hemorrhage (series 11, image 86). Finding is favored to reflect a focal venous confluence. No other AVM or other vascular abnormality seen underlying the acute intraparenchymal bleed. Review of the MIP images confirms the above findings IMPRESSION: 1. No significant interval change in size and morphology of estimated 5 cc intraparenchymal hemorrhage centered at the posterior right temporal lobe. Mildly increased localized vasogenic edema without significant regional mass effect. No other new acute intracranial abnormality. 2. 7 mm blush of contrast at the posterior right temporal lobe, immediately adjacent to the right temporal lobe hemorrhage. While this is favored to reflect a  focal venous confluence, confirmation with dedicated catheter directed angiography suggested for confirmatory purposes given its close proximity to the adjacent bleed. 3. Mild atherosclerotic change about the carotid bifurcations and carotid siphons without hemodynamically significant stenosis. 4. Otherwise negative CTA of the head and neck. No large vessel occlusion, hemodynamically significant stenosis, or other acute vascular abnormality. Electronically Signed   By: Jeannine Boga M.D.   On: 06/25/2020 19:47   MR BRAIN W WO CONTRAST  Result Date: 06/26/2020 CLINICAL DATA:  Cerebral hemorrhage suspected. EXAM: MRI HEAD WITHOUT AND WITH CONTRAST TECHNIQUE: Multiplanar, multiecho pulse sequences of the brain and surrounding structures were obtained without and with intravenous contrast. CONTRAST:  56mL GADAVIST GADOBUTROL 1 MMOL/ML IV SOLN COMPARISON:  Head CT Aug 25, 2020 FINDINGS: The study is partially degraded by motion. Brain: No significant interval change in size of the posterior right temporal lobe intraparenchymal hematoma measuring approximately 2.9 x 2.6 x 1.4 cm with mild surrounding vasogenic edema. There is effacement of the adjacent cerebral sulci without significant mass effect or midline shift. Prominent vessels are seen in the superior aspect of the hematoma (series 19, image 33) which may represent displaced leptomeningeal vessels although the possibility of a micro AVM cannot be entirely excluded. No focus of abnormal contrast enhancement identified. No acute infarct, hydrocephalus or extra-axial collection. Scattered and confluent foci of T2 hyperintensity are seen within the white matter of the cerebral hemispheres, nonspecific, most likely related to chronic microangiopathic changes. Vascular: Normal flow voids. Skull and upper cervical spine: Normal marrow signal. Sinuses/Orbits: Negative. IMPRESSION: 1. No significant interval change in size of the posterior right temporal lobe  intraparenchymal hematoma measuring approximately 2.9 x 2.6 x  1.4 cm with mild surrounding vasogenic edema. No focus of abnormal contrast enhancement identified. 2. Prominent vessels in the superior aspect of the hematoma may represent displaced leptomeningeal vessels although the possibility of a micro AVM cannot be entirely excluded. Electronically Signed   By: Pedro Earls M.D.   On: 06/26/2020 08:37   EEG adult  Result Date: 06/26/2020 Lora Havens, MD     06/26/2020  4:57 PM Patient Name: Cipriano Millikan MRN: [id number] Epilepsy Attending: Lora Havens Referring Physician/Provider: Dr Amie [place] Date: 06/26/2020 Duration: 24.26 mins Patient history: 74yoM with right ICH. EEG to evaluate for seizure Level of alertness: Awake, asleep AEDs during EEG study: LEV Technical aspects: This EEG study was done with scalp electrodes positioned according to the 10-20 International system of electrode placement. Electrical activity was acquired at a sampling rate of 500Hz  and reviewed with a high frequency filter of 70Hz  and a low frequency filter of 1Hz . EEG data were recorded continuously and digitally stored. Description: The posterior dominant rhythm consists of 8-9 Hz activity of moderate voltage (25-35 uV) seen predominantly in posterior head regions, symmetric and reactive to eye opening and eye closing. Sleep was characterized by vertex waves, sleep spindles (12 to 14 Hz), maximal frontocentral region.  EEG showed intermittent right temporal 3 to 6 Hz theta-delta slowing. Sharp waves were also seen in right posterior temporal region. Hyperventilation and photic stimulation were not performed.   ABNORMALITY -Intermittent slow, right temporal region -Sharp wave, right posterior temporal region. IMPRESSION: This study showed evidence of potential epileptogenicity arising from right posterior temporal region as well as cortical dysfunction in the right temporal region likely secondary to underlying  bleed. No seizures were seen throughout the recording. Lora Havens   ECHOCARDIOGRAM COMPLETE  Result Date: 06/25/2020    ECHOCARDIOGRAM REPORT   Patient Name:   MALEAK Epler Date of Exam: 06/25/2020 Medical Rec #:  [id number]   Height:       70.0 in Accession #:    [id number]  Weight:       216.3 lb Date of Birth:  10-30-1945  BSA:          2.158 m Patient Age:    73 years    BP:           154/86 mmHg Patient Gender: M           HR:           56 bpm. Exam Location:  Inpatient Procedure: 2D Echo, Cardiac Doppler and Color Doppler Indications:    Stroke  History:        Patient has no prior history of Echocardiogram examinations.                 Signs/Symptoms:Altered Mental Status.  Sonographer:    Merrie Roof RDCS Referring Phys: [id number] [place]  1. Left ventricular ejection fraction, by estimation, is 50 to 55%. The left ventricle has low normal function. The left ventricle has no regional wall motion abnormalities. Left ventricular diastolic parameters are indeterminate.  2. Right ventricular systolic function is normal. The right ventricular size is normal.  3. Left atrial size was moderately dilated.  4. Right atrial size was mildly dilated.  5. The mitral valve is grossly normal. No evidence of mitral valve regurgitation. No evidence of mitral stenosis.  6. The aortic valve is grossly normal. There is mild calcification of the aortic valve. Aortic valve regurgitation is not visualized. Mild aortic valve  sclerosis is present, with no evidence of aortic valve stenosis.  7. The inferior vena cava is normal in size with greater than 50% respiratory variability, suggesting right atrial pressure of 3 mmHg. Comparison(s): No prior Echocardiogram. Conclusion(s)/Recommendation(s): Normal biventricular function without evidence of hemodynamically significant valvular heart disease. FINDINGS  Left Ventricle: Left ventricular ejection fraction, by estimation, is 50 to 55%. The left ventricle has  low normal function. The left ventricle has no regional wall motion abnormalities. The left ventricular internal cavity size was normal in size. There is no left ventricular hypertrophy. Left ventricular diastolic parameters are indeterminate. Right Ventricle: The right ventricular size is normal. Right vetricular wall thickness was not well visualized. Right ventricular systolic function is normal. Left Atrium: Left atrial size was moderately dilated. Right Atrium: Right atrial size was mildly dilated. Pericardium: There is no evidence of pericardial effusion. Mitral Valve: The mitral valve is grossly normal. No evidence of mitral valve regurgitation. No evidence of mitral valve stenosis. Tricuspid Valve: The tricuspid valve is grossly normal. Tricuspid valve regurgitation is not demonstrated. No evidence of tricuspid stenosis. Aortic Valve: The aortic valve is grossly normal. There is mild calcification of the aortic valve. Aortic valve regurgitation is not visualized. Mild aortic valve sclerosis is present, with no evidence of aortic valve stenosis. Aortic valve mean gradient  measures 5.0 mmHg. Aortic valve peak gradient measures 7.2 mmHg. Aortic valve area, by VTI measures 3.11 cm. Pulmonic Valve: The pulmonic valve was not well visualized. Pulmonic valve regurgitation is not visualized. Aorta: The aortic root, ascending aorta and aortic arch are all structurally normal, with no evidence of dilitation or obstruction. Venous: The inferior vena cava is normal in size with greater than 50% respiratory variability, suggesting right atrial pressure of 3 mmHg. IAS/Shunts: The atrial septum is grossly normal.  LEFT VENTRICLE PLAX 2D LVIDd:         5.00 cm  Diastology LVIDs:         3.70 cm  LV e' medial:    5.55 cm/s LV PW:         0.80 cm  LV E/e' medial:  9.2 LV IVS:        1.00 cm  LV e' lateral:   7.72 cm/s LVOT diam:     2.10 cm  LV E/e' lateral: 6.6 LV SV:         86 LV SV Index:   40 LVOT Area:     3.46 cm   RIGHT VENTRICLE          IVC RV Basal diam:  3.30 cm  IVC diam: 1.50 cm LEFT ATRIUM             Index       RIGHT ATRIUM           Index LA diam:        4.50 cm 2.09 cm/m  RA Area:     18.20 cm LA Vol (A2C):   81.4 ml 37.72 ml/m RA Volume:   48.90 ml  22.66 ml/m LA Vol (A4C):   88.4 ml 40.97 ml/m LA Biplane Vol: 88.1 ml 40.83 ml/m  AORTIC VALVE AV Area (Vmax):    3.26 cm AV Area (Vmean):   2.53 cm AV Area (VTI):     3.11 cm AV Vmax:           134.00 cm/s AV Vmean:          103.000 cm/s AV VTI:  0.275 m AV Peak Grad:      7.2 mmHg AV Mean Grad:      5.0 mmHg LVOT Vmax:         126.00 cm/s LVOT Vmean:        75.200 cm/s LVOT VTI:          0.247 m LVOT/AV VTI ratio: 0.90  AORTA Ao Root diam: 3.50 cm Ao Asc diam:  3.30 cm MITRAL VALVE MV Area (PHT): 2.45 cm    SHUNTS MV Decel Time: 310 msec    Systemic VTI:  0.25 m MV E velocity: 51.00 cm/s  Systemic Diam: 2.10 cm MV A velocity: 74.60 cm/s MV E/A ratio:  0.68 Buford Dresser MD Electronically signed by Buford Dresser MD Signature Date/Time: 06/25/2020/4:28:31 PM    Final    CT HEAD CODE STROKE WO CONTRAST  Result Date: 06/25/2020 CLINICAL DATA:  Code stroke. Initial evaluation for acute left-sided weakness. EXAM: CT HEAD WITHOUT CONTRAST TECHNIQUE: Contiguous axial images were obtained from the base of the skull through the vertex without intravenous contrast. COMPARISON:  None. FINDINGS: Brain: Examination degraded by motion artifact. 2.9 x 2.7 x 1.3 cm acute intraparenchymal hemorrhage seen positioned at the posterior right temporal lobe (estimated volume 5 cc). Mild surrounding vasogenic edema without significant regional mass effect. No other visible acute intracranial hemorrhage. No other acute large vessel territory infarct. No mass lesion or midline shift. No hydrocephalus or extra-axial fluid collection. Underlying moderate to advanced chronic microvascular ischemic disease. Vascular: Calcified atherosclerosis at the skull base.  No asymmetric hyperdense vessel. Skull: No scalp soft tissue abnormality.  Calvarium intact. Sinuses/Orbits: Globes and orbital soft tissues within normal limits. Paranasal sinuses are clear. Trace left mastoid effusion noted, of doubtful significance. Other: None. ASPECTS [facility] Stroke Program Early CT Score) Does not apply given acute intracranial hemorrhage. IMPRESSION: 1. 2.9 x 2.7 x 1.3 cm acute intraparenchymal hemorrhage centered at the posterior right temporal lobe (estimated volume 5 cc). Mild localized edema without significant regional mass effect. 2. Underlying moderate to advanced chronic microvascular ischemic disease. Critical Value/emergent results were called by telephone at the time of interpretation on 06/25/2020 at 3:50 am to provider Dr. Theda Sers, who verbally acknowledged these results. Electronically Signed   By: Jeannine Boga M.D.   On: 06/25/2020 03:53    Labs:  CBC: Recent Labs    06/25/20 0340 06/25/20 0350 06/26/20 0217 06/27/20 0344  WBC  --  14.1* 9.3 7.6  HGB 18.0* 17.0 15.5 14.6  HCT 53.0* 52.9* 44.5 41.6  PLT  --  145* 102* 95*    COAGS: Recent Labs    06/25/20 0350  INR 1.0  APTT 24    BMP: Recent Labs    06/25/20 0340 06/25/20 0350 06/26/20 0217 06/27/20 0344  NA 139 140 139 139  K 3.1* 3.0* 3.1* 3.0*  CL 100 98 103 105  CO2  --  24 24 25   GLUCOSE 201* 222* 140* 119*  BUN 22 15 16 15   CALCIUM  --  9.7 9.4 9.2  CREATININE 1.00 1.12 0.99 0.84  GFRNONAA  --  >60 >60 >60    LIVER FUNCTION TESTS: Recent Labs    06/25/20 0350  BILITOT 1.1  AST 35  ALT 23  ALKPHOS 51  PROT 7.5  ALBUMIN 4.7    Assessment and Plan:  History of right temporal lobe IPH s/p diagnostic cerebral arteriogram 06/26/2020 by Dr. 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 Patient's condition  stable- awake and alert, follows simple commands, speech clear, can spontaneously move all  extremities. Right femoral puncture site stable, distal pulses (DPs) 1+ bilaterally. Per patient's daughters he resides in [place]. Recommend outpatient follow-up with a diagnostic cerebral arteriogram 4-6 weeks after discharge in [facility] if possible (if unable to find NIR near patient's home town, please let us know and we will arrange OP F/U with [place]). Further plans per neurology/CCM- appreciate and agree with management. Please call NIR with questions/concerns.   Electronically Signed: Earley Abide, PA-C 06/27/2020, 9:07 AM   I spent a total of 15 Minutes at the the patient's bedside AND on the patient's hospital floor or unit, greater than 50% of which was counseling/coordinating care for IPH s/p diagnostic cerebral arteriogram.

## 2020-06-27 NOTE — Progress Notes (Addendum)
STROKE TEAM PROGRESS NOTE   INTERVAL HISTORY His daughters are at the bedside.  Patient is awake and appears less agitated.  HR improved now off of Precedex. Will plan to use Klonopin prn for agitation and resume ability and zoloft.   EEG leads removed this morning.  No evidence of seizures CT head today for stroke follow-up.   Neuro exam: Awake, A+Ox4, PERRL, EOMI, FCx4, Moves all extremities 4+/5.   Vitals:   06/27/20 0500 06/27/20 0600 06/27/20 0700 06/27/20 0800  BP: 120/72 121/69 129/75 131/68  Pulse: (!) 52 (!) 51 (!) 51 (!) 55  Resp: 18 (!) 22 15 20   Temp:      TempSrc:      SpO2: 94% 93% 96% 98%  Weight:      Height:       CBC:  Recent Labs  Lab 06/25/20 0350 06/26/20 0217 06/27/20 0344  WBC 14.1* 9.3 7.6  NEUTROABS 10.3*  --   --   HGB 17.0 15.5 14.6  HCT 52.9* 44.5 41.6  MCV 89.5 84.1 84.7  PLT 145* 102* 95*   Basic Metabolic Panel:  Recent Labs  Lab 06/25/20 0800 06/26/20 0217 06/27/20 0344  NA  --  139 139  K  --  3.1* 3.0*  CL  --  103 105  CO2  --  24 25  GLUCOSE  --  140* 119*  BUN  --  16 15  CREATININE  --  0.99 0.84  CALCIUM  --  9.4 9.2  MG 1.9  --   --   PHOS 2.5  --   --    Lipid Panel:  Recent Labs  Lab 06/25/20 0800  CHOL 141  TRIG 41  HDL 57  CHOLHDL 2.5  VLDL 8  LDLCALC 76   HgbA1c:  Recent Labs  Lab 06/25/20 0800  HGBA1C 5.6   Urine Drug Screen:  Recent Labs  Lab 06/25/20 1409  LABOPIA NONE DETECTED  COCAINSCRNUR NONE DETECTED  LABBENZ NONE DETECTED  AMPHETMU NONE DETECTED  THCU POSITIVE*  LABBARB NONE DETECTED    Alcohol Level  Recent Labs  Lab 06/25/20 0800  ETH <10    IMAGING past 24 hours EEG adult  Result Date: 06/26/2020 IMPRESSION: This study showed evidence of potential epileptogenicity arising from right posterior temporal region as well as cortical dysfunction in the right temporal region likely secondary to underlying bleed. No seizures were seen throughout the recording. [place]     PHYSICAL EXAM General: On Precedex, not intubated HEENT:  normocephalic, left lateral tongue laceration from bite CVS: Bradycardic Extremities warm well perfused Neurological exam Awake, alert, oriented to person, place, month, year Mild dysarthria No aphasia Cranial nerves: Pupils equal round react to light, extraocular movements intact, visual fields full, face appears symmetric, facial sensation intact, tongue and palate midline. Motor exam: 4+/5 in all fours without drift. Sensation intact light touch all over FNF intact   EEG:  IMPRESSION: This study showed evidence of potential epileptogenicity arising fromright posterior temporal region as well as cortical dysfunction in the right temporal region likely secondary to underlying bleed.No seizures were seen throughout the recording.    Current Facility-Administered Medications:  .   stroke: mapping our early stages of recovery book, , Does not apply, Once, Collins, Unk Pinto, MD .  0.9 %  sodium chloride infusion, , Intravenous, Continuous, Rosalin Hawking, MD, Last Rate: 50 mL/hr at 06/26/20 1813, Restarted at 06/26/20 1813 .  0.9 %  sodium chloride infusion, ,  Intravenous, Continuous, Deveshwar, Sanjeev, MD, Last Rate: 75 mL/hr at 06/27/20 0700, Infusion Verify at 06/27/20 0700 .  acetaminophen (TYLENOL) tablet 650 mg, 650 mg, Oral, Q4H PRN **OR** acetaminophen (TYLENOL) 160 MG/5ML solution 650 mg, 650 mg, Per Tube, Q4H PRN **OR** acetaminophen (TYLENOL) suppository 650 mg, 650 mg, Rectal, Q4H PRN, Gwinda Maine, MD .  ARIPiprazole (ABILIFY) tablet 10 mg, 10 mg, Oral, Daily, Amie Portland, MD .  Chlorhexidine Gluconate Cloth 2 % PADS 6 each, 6 each, Topical, Daily, Rosalin Hawking, MD, 6 each at 06/27/20 0630 .  clevidipine (CLEVIPREX) infusion 0.5 mg/mL, 0-21 mg/hr, Intravenous, Continuous, Amie Portland, MD, Last Rate: 18 mL/hr at 06/27/20 0739, 9 mg/hr at 06/27/20 0739 .  dexmedetomidine (PRECEDEX) 400 MCG/100ML (4 mcg/mL)  infusion, 0.2-0.4 mcg/kg/hr, Intravenous, Titrated, Corey Harold, NP, Last Rate: 9.81 mL/hr at 06/27/20 0700, 0.4 mcg/kg/hr at 06/27/20 0700 .  levETIRAcetam (KEPPRA) IVPB 1500 mg/ 100 mL premix, 1,500 mg, Intravenous, Q12H, Rosalin Hawking, MD, Stopped at 06/26/20 2336 .  LORazepam (ATIVAN) injection 1 mg, 1 mg, Intravenous, Q15 min PRN, Metzger-Cihelka, Desiree, NP .  pantoprazole (PROTONIX) injection 40 mg, 40 mg, Intravenous, QHS, Collins, Unk Pinto, MD, 40 mg at 06/26/20 2131 .  potassium chloride SA (KLOR-CON) CR tablet 20 mEq, 20 mEq, Oral, Q4H, Amie Portland, MD .  senna-docusate (Senokot-S) tablet 1 tablet, 1 tablet, Oral, BID, Collins, Unk Pinto, MD .  sertraline (ZOLOFT) tablet 50 mg, 50 mg, Oral, BID, Amie Portland, MD .  sodium chloride flush (NS) 0.9 % injection 3 mL, 3 mL, Intravenous, Once, Pollina, Gwenyth Allegra, MD    ASSESSMENT/PLAN Mathew Malone is a 75 y.o. male with history of HTN, HLD presenting with new onset seizures and AMS. 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   MRI brain reviewed: Does not have the typical appearance of cerebral amyloid angiopathy. There was concern for micro-AVM but diagnostic cerebral angiogram done by Dr. Estanislado Pandy reports no AVM aneurysm or dural AV fistula.  ICH has been stable on multiple CTs and MRIs done throughout the course of this hospitalization for now.    ICH: right temporal lobe ICH, etiology likely hypertensive,   CT head right temporal lobe ICH about 5cc  CTA head & neck stable hematoma, 7 mm blush of contrast at the posterior right temporal lobe, immediately adjacent to the right temporal lobe hemorrhage  Cerebral angiogram done (06/26/2020): no AVM aneurysm or dural AV fistula reported  MRI w/wo: See details above  CT head today for stroke follow-up  LDL 76  HgbA1c 5.6  VTE prophylaxis - SCDs  2D echo: EF 50-55%, moderate biatrial dilation  ASA 81mg  prior to admission, now no AP/AC d/t bleed  Therapy  recommendations:  pending  Disposition:  pending  Seizure, new onset  Secondary to temporal ICH  Loaded with 3mg  Ativan, 4g Keppra and 3g VPA in ER (3/7)  Continue 1500mg  Keppra BID  Significant post-ictal agitation -> Precedex weaned off  EEG: potential epileptogenicity arising fromright posterior temporal region as well as cortical dysfunction in the right temporal region likely secondary to underlying bleed.No seizures were seen throughout the recording. D/C EEG leads today  PCCM consultation-especially for management of Precedex as well as bradycardia-bradycardia likely secondary to prececex  Hypertension  Home meds:  Maxzide, Coreg, Zestril, Hydrodiuril-unclear compliance-suspect non-compliance to meds. [place] records not available. . Wean off Cleviprex. . Add home lisinopril 40mg  . Change SBP goal to <160 . Long-term BP goal normotensive  Sinus Bradycardia  Worsened while on  high dose precedex  Continue to monitor telemetry  Appears asymptomatic at this time  Hyperlipidemia  Home meds:  Lovaza, Q10  LDL 76, goal < 70  Consider statin at discharge  Dysphagia   Passed bedside swallow eval  Speech on board  PTSD, history  Sertraline 50 mg bid  Ability 10 mg daily  Clonazepam 0.5mg  bid prn  Hypokalemia  K 3.0 replaced with potassium chloride 65mEq  Thrombocytopenia  Platelet count 125 prior admission  Platelet count 145->102->95   Repeat level in am  Other Stroke Risk Factors  Advanced Age >/= 65   OSA on CPAP at home  Previous Cigarette smoker  Obesity, Body mass index is 31.03 kg/m., BMI >/= 30 associated with increased stroke risk, recommend weight loss, diet and exercise as appropriate   Other Active Problems  PTSD- high risk for delirium. Should resume mood/SSRi meds when able  Prostate Cancer- given this history, will check MRI w/wo to look for underlying lesions  Chronic urinary retention- resume oxybutynin when  able  Grief: Recently lost his wife. He has been having difficulty managing alone. He is visiting his two daughters that live in [place] from [place], [place] [facility]    Consult: PCCM for Precedex and bradycardia management  Hospital day # 2  Attending Neurohospitalist Addendum Patient seen and examined with APP/Resident. Agree with the history and physical as documented above. Agree with the plan as documented, which I helped formulate. I have independently reviewed the chart, obtained history, review of systems and examined the patient.I have personally reviewed pertinent head/neck/spine imaging (CT/MRI).  Patient seen and examined.  Feels better today-daughter at bedside as well feels he is better. Sedation reduced but had to be reintroduced due to some agitation overnight. Bradycardia likely related to sedation. Agree with history physical documented above.  Agree with the plan above that I discussed with PCCM attending on the unit as well.  Please feel free to call with any questions.   -- Amie Portland, MD Stroke Neurology Pager: [phone]  CRITICAL CARE ATTESTATION Performed by: Amie Portland, MD Total critical care time: 31 minutes Critical care time was exclusive of separately billable procedures and treating other patients and/or supervising APPs/Residents/Students Critical care was necessary to treat or prevent imminent or life-threatening deterioration due to [place] This patient is critically ill and at significant risk for neurological worsening and/or death and care requires constant monitoring. Critical care was time spent personally by me on the following activities: development of treatment plan with patient and/or surrogate as well as nursing, discussions with consultants, evaluation of patient's response to treatment, examination of patient, obtaining history from patient or surrogate, ordering and performing treatments  and interventions, ordering and review of laboratory studies, ordering and review of radiographic studies, pulse oximetry, re-evaluation of patient's condition, participation in multidisciplinary rounds and medical decision making of high complexity in the care of this patient.

## 2020-06-27 NOTE — Evaluation (Addendum)
Occupational Therapy Evaluation Patient Details Name: Mathew Malone MRN: [id number] DOB: 1945-04-30 Today's Date: 06/27/2020    History of Present Illness Mathew Malone is a 75 y.o. male walked downstairs and collapsed, incontinent, with left side not moving but then started moving LLE. 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  PMHx: HTN, PTSD, HOH   Clinical Impression   Pt PTA: Pt living alone, reports independence; daughter present to confirm information gathered from PT session. Pt currently, modA for ADL and minA for transfers. Pt with limited strength, decreased cognition, decreased activity tolerance and decreased safety awareness.  Rn aware of impulsivity and need for something to relax after standing and suddenly feeling nauseated resulting in dry heaving for 1 minute and because he was feeling restless and finally calmed down after 2 mins. Pt would benefit from continued OT skilled services for ADL, mobility and safety in CIR setting. OT following acutely.      Follow Up Recommendations  CIR;Supervision/Assistance - 24 hour    Equipment Recommendations  3 in 1 bedside commode    Recommendations for Other Services Rehab consult     Precautions / Restrictions Precautions Precautions: Fall Restrictions Weight Bearing Restrictions: No      Mobility Bed Mobility               General bed mobility comments: seated in recliner    Transfers Overall transfer level: Needs assistance Equipment used: Rolling walker (2 wheeled) Transfers: Sit to/from [facility] Sit to Stand: Min assist Stand pivot transfers: Mod assist       General transfer comment: Pt modA for sit to stand and reporting dizziness in standing, but unable to stay standing for BP/pt impulsive and requiring assist.  145/89 at rest after a minute of sitting.    Balance Overall balance assessment: Needs assistance Sitting-balance  support: No upper extremity supported;Feet supported Sitting balance-Leahy Scale: Fair Sitting balance - Comments: Pt can sit with min guard assist on EOB but needs close guard assist due to impulsivity.  Dizziness also reported.   Standing balance support: Bilateral upper extremity supported;During functional activity Standing balance-Leahy Scale: Poor Standing balance comment: Pt was able to stand with min to mod assist for balance with UE support and RW.                           ADL either performed or assessed with clinical judgement   ADL Overall ADL's : Needs assistance/impaired Eating/Feeding: Supervision/ safety;Sitting   Grooming: Minimal assistance;Sitting   Upper Body Bathing: Minimal assistance;Sitting   Lower Body Bathing: Moderate assistance;Sitting/lateral leans;Sit to/from stand   Upper Body Dressing : Minimal assistance;Sitting   Lower Body Dressing: Moderate assistance;Cueing for safety;Cueing for sequencing;Sitting/lateral leans;Sit to/from stand Lower Body Dressing Details (indicate cue type and reason): Pt able to reach feet, but unable to pull to stand without assist. Toilet Transfer: Minimal assistance;Stand-pivot;BSC Toilet Transfer Details (indicate cue type and reason): Pt immediately requiring BSC and OTR retrieived it and minA for transfer with impulsivity. Toileting- Clothing Manipulation and Hygiene: Moderate assistance;Sitting/lateral lean;Sit to/from stand;Cueing for safety Toileting - Clothing Manipulation Details (indicate cue type and reason): OTR performing most of pericare, but pt able to wipe dry bottom in standing, but unsafely due to instability with RW.     Functional mobility during ADLs: Minimal assistance;Cueing for safety;Rolling walker General ADL Comments: Pt limited by decreased strength, decreased ability to care for self and  decreased mobility for safety. Pt with restless leg and rocking back and forth describing associated  with PTSD symptoms during our session with 3rd stand, pt abruptly trying to throw up and needing to sit down and use a blue bag. Pt became very emotional and eventually was able to calm down. RN in room and aware.     Vision Baseline Vision/History: No visual deficits Patient Visual Report: No change from baseline Vision Assessment?: No apparent visual deficits     Perception     Praxis      Pertinent Vitals/Pain Pain Assessment: No/denies pain     Hand Dominance Right   Extremity/Trunk Assessment Upper Extremity Assessment Upper Extremity Assessment: Generalized weakness;LUE deficits/detail LUE Deficits / Details: 3/5 MM grade strength LUE Coordination: decreased fine motor;decreased gross motor   Lower Extremity Assessment Lower Extremity Assessment: Generalized weakness   Cervical / Trunk Assessment Cervical / Trunk Assessment: Normal   Communication Communication Communication: Other (comment)   Cognition Arousal/Alertness: Awake/alert Behavior During Therapy: Impulsive Overall Cognitive Status: Impaired/Different from baseline Area of Impairment: Following commands;Safety/judgement;Awareness                       Following Commands: Follows one step commands consistently Safety/Judgement: Decreased awareness of safety;Decreased awareness of deficits Awareness: Intellectual   General Comments: Pt A/O, but very impulsive and unable to leave pt for a few seconds without him attempting to stand or do something instead of sit down. Pt stating "I have to go to the bathroom" and starts standing to go to bathroom with IV lines and OTR not ready for pt to stand. Pt requires max cues from daughter and OTR to sit for a few seconds while lines are placed and BSC is next to patient.   General Comments  BP remained stable 150/90s to 150/80s throughout.    Exercises     Shoulder Instructions      Home Living Family/patient expects to be discharged to:: Private  residence Living Arrangements: Children Available Help at Discharge: Other (Comment) Type of Home:  (lives alone, [place] was visiting here from [place]) Home Access: Stairs to enter [employer] of Steps: 1   Home Layout: Two level;Able to live on main level with bedroom/bathroom     Bathroom Shower/Tub: Occupational psychologist: Handicapped height     Home Equipment: Grab bars - toilet;Grab bars - tub/shower;Kasandra Knudsen - single point      Lives With: Alone    Prior Functioning/Environment Level of Independence: Independent with assistive device(s);Needs assistance  Gait / Transfers Assistance Needed: used cane long distances per pt ADL's / Homemaking Assistance Needed: No assist            OT Problem List: Decreased strength;Decreased activity tolerance;Impaired balance (sitting and/or standing);Decreased safety awareness;Decreased cognition;Decreased knowledge of use of DME or AE;Pain;Impaired UE functional use      OT Treatment/Interventions: Self-care/ADL training;Therapeutic exercise;Energy conservation;DME and/or AE instruction;Therapeutic activities;Cognitive remediation/compensation;Patient/family education;Balance training    OT Goals(Current goals can be found in the care plan section) Acute Rehab OT Goals Patient Stated Goal: to return to [facility] OT Goal Formulation: With patient/family Time For Goal Achievement: 07/11/20 Potential to Achieve Goals: Good ADL Goals Pt Will Perform Grooming: with min guard assist;standing Pt Will Perform Lower Body Dressing: with min guard assist;sit to/from stand Pt Will Transfer to Toilet: with supervision;ambulating;bedside commode Pt Will Perform Toileting - Clothing Manipulation and hygiene: with min guard assist;sitting/lateral leans;sit to/from stand Additional ADL Goal #1:  Pt will participate in higher level cognitive tasks in order to assess safety and cognition with daily tasks.  OT Frequency: Min 2X/week    Barriers to D/C: Decreased caregiver support  daughters live out of town       Co-evaluation PT/OT/SLP Co-Evaluation/Treatment: Yes Reason for Co-Treatment: Complexity of the patient's impairments (multi-system involvement);To address functional/ADL transfers;For patient/therapist safety   OT goals addressed during session: ADL's and self-care      AM-PAC OT "6 Clicks" Daily Activity     Outcome Measure Help from another person eating meals?: A Little Help from another person taking care of personal grooming?: A Little Help from another person toileting, which includes using toliet, bedpan, or urinal?: A Lot Help from another person bathing (including washing, rinsing, drying)?: A Lot Help from another person to put on and taking off regular upper body clothing?: A Little Help from another person to put on and taking off regular lower body clothing?: A Lot 6 Click Score: 15   End of Session Nurse Communication: Mobility status  Activity Tolerance: Patient tolerated treatment well Patient left: in chair;with call bell/phone within reach;with chair alarm set;with nursing/sitter in room;with family/visitor present  OT Visit Diagnosis: Unsteadiness on feet (R26.81);Muscle weakness (generalized) (M62.81);Other symptoms and signs involving cognitive function                Time: 0539-7673 OT Time Calculation (min): 45 min Charges:  OT General Charges $OT Visit: 1 Visit OT Evaluation $OT Eval Moderate Complexity: 1 Mod OT Treatments $Self Care/Home Management : 8-22 mins $Therapeutic Activity: 8-22 mins  Jefferey Pica, OTR/L Acute Rehabilitation Services Pager: [phone] Office: [phone]   Mikayah Joy C 06/27/2020, 5:43 PM

## 2020-06-27 NOTE — Consult Note (Signed)
Physical Medicine and Rehabilitation Consult  Reason for Consult: Functional deficits secondary to seizure/ICH Referring Physician: Dr. Rory Percy   HPI: Mathew Malone is a 75 y.o. RH-male with history of HTN, PTSD ([place] war), OSA-CPAP, prostate cancer who was admitted on 06/25/20 with seizure followed by fall and collapse followed by right gaze preference and left-sided weakness with bowel incontinence.  Patient from [facility] visiting local family and has recently lost wife with grief issues and difficulty managing on his own.  UDS positive for THC.  CT head done revealing 2.9 x 2.7 x 1.3 cm acute IPH in posterior right temporal lobe and mild localized edema as well as moderate to advanced chronic small vessel disease.  CTA head/neck showed no change in size of bleed with 7 mm blush of contrast immediately adjacent right temporal lobe question focal venous confluence.  He was loaded with Keppra and valproic acid as well as Ativan.    He had bouts of agitation with lethargy requiring Precedex and developed bradycardia requiring transfer to ICU.  MRI brain done 03/07 and revealed no significant change in bleed and question of micro-AVM.  He underwent cerebral angiogram that was negative for aneurysm, AVM, shunting or dissection.  EEG done showing potential epileptogenic city arising from right posterior temporal lobe as well as cortical dysfunction right temporal region felt to be secondary to bleed.  Bleed felt to be hypertensive in nature and ASA discontinued. He continues on cleviprex for BP management and hypokalemia treated with runs of K+.  Therapy evaluations completed revealing weakness with dizziness, lability, impulsivity as well as cognitive deficits. He was able to sit in a chair for about 2 hours per family.  CIR was recommended due to functional decline.  Patient/family would like rehab at the [facility] ( as 100% connected) in [place] or [place].   Review of Systems  Constitutional: Negative for chills and  fever.  HENT: Positive for hearing loss.   Eyes: Negative for blurred vision and double vision.  Respiratory: Negative for cough and shortness of breath.   Cardiovascular: Negative for chest pain and palpitations.  Gastrointestinal: Negative for abdominal pain, heartburn and nausea.  Musculoskeletal: Negative for myalgias and neck pain.  Neurological: Positive for weakness. Negative for dizziness, sensory change and headaches.     Past Medical History:  Diagnosis Date   Arthritis    "fingers; knees; right shoulder" (04/28/2013)   Cancer ([place]) 2008   prostate   Exertional shortness of breath    "here lately" (04/28/2013)   Gout    HOH (hard of hearing)    bilateral   Hyperlipidemia    Hypertension    stress test- 2011   OSA on CPAP    Post traumatic stress disorder (PTSD)     Past Surgical History:  Procedure Laterality Date   INGUINAL HERNIA REPAIR Right 1970's   IR ANGIO INTRA EXTRACRAN SEL COM CAROTID INNOMINATE BILAT MOD SED  06/26/2020   IR ANGIO VERTEBRAL SEL SUBCLAVIAN INNOMINATE BILAT MOD SED  06/26/2020   JOINT REPLACEMENT     LEFT HEART CATHETERIZATION WITH CORONARY ANGIOGRAM N/A 04/29/2013   Procedure: LEFT HEART CATHETERIZATION WITH CORONARY ANGIOGRAM;  Surgeon: Laverda Page, MD;  Location[facility] CATH LAB;  Service: Cardiovascular;  Laterality: N/A;   PENILE PROSTHESIS IMPLANT  04/05/2011   Procedure: PENILE PROTHESIS INFLATABLE;  Surgeon: Claybon Jabs, MD;  Location: [facility];  Service: Urology;  Laterality: N/A;   EXPLORATION AND REVISION OF INFLATABLE PENILE  PROSTHESIS    PUMP   PENILE PROSTHESIS IMPLANT  10/21/2011   Procedure: PENILE PROTHESIS INFLATABLE;  Surgeon: Claybon Jabs, MD;  Location: [facility];  Service: Urology;  Laterality: N/A;      PENILE PROSTHESIS PLACEMENT  12/2010   ROBOT ASSISTED LAPAROSCOPIC RADICAL PROSTATECTOMY  2008   SHOULDER OPEN ROTATOR CUFF REPAIR Bilateral 2007-2008   "twice on  each side"   TONSILLECTOMY     TOTAL KNEE ARTHROPLASTY Bilateral 2011-2012   lt knee-2012  / rt knee -2011    No family history on file.    Social History: Recently widowed and independent with use of walking sticks prior to admission.  Disabled veteran. Per reports he quit smoking about 49 years ago. His smoking use included cigarettes. He has a 0.48 pack-year smoking history. He has never used smokeless tobacco. He reports current alcohol use of about 1.0 standard drink of alcohol per week. He reports current drug use. Drug: Marijuana.    Allergies: No Known Allergies    Medications Prior to Admission  Medication Sig Dispense Refill   acetaminophen (TYLENOL) 500 MG tablet Take 1,000 mg by mouth every 6 (six) hours as needed for mild pain.     allopurinol (ZYLOPRIM) 100 MG tablet Take 200 mg by mouth daily.     ascorbic acid (VITAMIN C) 500 MG tablet Take 500 mg by mouth 2 (two) times daily.     aspirin 81 MG chewable tablet Chew 162 mg by mouth at bedtime.     calcium-vitamin D (OSCAL WITH D) 500-200 MG-UNIT per tablet Take 1 tablet by mouth daily.     [place] 450 MG CAPS Take 450 mg by mouth daily.     cetirizine (ZYRTEC) 10 MG tablet Take 10 mg by mouth daily.     DHA-Vitamin C-Lutein (EYE HEALTH FORMULA PO) Take 470 mg by mouth daily. Eye bright tab     gabapentin (NEURONTIN) 600 MG tablet Take 1,200 mg by mouth 3 (three) times daily.     galantamine (RAZADYNE ER) 8 MG 24 hr capsule Take 8 mg by mouth daily with breakfast.     hydrochlorothiazide (HYDRODIURIL) 25 MG tablet Take 12.5 mg by mouth daily.     ibuprofen (ADVIL) 800 MG tablet Take 800 mg by mouth every 8 (eight) hours as needed.     Iron-Vitamins (GERITOL) LIQD Take 1 Dose by mouth daily.     lisinopril (ZESTRIL) 40 MG tablet Take 40 mg by mouth daily.     mirabegron ER (MYRBETRIQ) 50 MG TB24 tablet Take 50 mg by mouth daily.     Misc Natural Products (TOTAL MEMORY & FOCUS FORMULA PO) Take 1  tablet by mouth daily.     OVER THE COUNTER MEDICATION Take 1 tablet by mouth daily. [place] energizer     PEG-KCl-NaCl-NaSulf-Na Asc-C (PLENVU) 140 g SOLR Take 140 g by mouth See admin instructions. As directed for bowel prep     polyvinyl alcohol (LIQUIFILM TEARS) 1.4 % ophthalmic solution Place into both eyes 2 (two) times daily as needed for dry eyes.     sertraline (ZOLOFT) 100 MG tablet Take 1 tablet (100 mg total) by mouth 2 (two) times daily. (Patient taking differently: Take 100 mg by mouth daily.)     simvastatin (ZOCOR) 40 MG tablet Take 20 mg by mouth daily.     ARIPiprazole (ABILIFY) 30 MG tablet Take 30 mg by mouth daily.     carvedilol (COREG) 6.25 MG tablet Take 1  tablet (6.25 mg total) by mouth 2 (two) times daily with a meal. (Patient not taking: No sig reported) 60 tablet 2   HYDROcodone-acetaminophen (VICODIN) 5-500 MG per tablet Take 1 tablet by mouth every 6 (six) hours as needed. For pain (Patient not taking: No sig reported) 30 tablet 0   Phentermine-Topiramate (QSYMIA) 7.5-46 MG CP24 Take 1 capsule by mouth daily. Take 1 capsule by mouth daily start day 15 after 14 day course. (Patient not taking: No sig reported) 14 capsule     Home: Home Living Family/patient expects to be discharged to:: Private residence Living Arrangements: Children Available Help at Discharge: Other (Comment) (Lives alone in [place], [place] was visiting here  ) Type of Home: House Home Access: Stairs to enter [employer] of Steps: 1 Home Layout: Two level,Able to live on main level with bedroom/bathroom Bathroom Shower/Tub: Multimedia programmer: Handicapped height [place]: Grab bars - toilet,Grab bars - tub/shower,Cane - single point  Lives With: Alone  Functional History: Prior Function Level of Independence: Independent with assistive device(s),Needs assistance Gait / Transfers Assistance Needed: used cane long distances per pt ADL's / Homemaking  Assistance Needed: No assist Functional Status:  Mobility: Bed Mobility Overal bed mobility: Needs Assistance Bed Mobility: Supine to Sit Supine to sit: Supervision General bed mobility comments: S for safety as pt is impulsive with all movements. Transfers Overall transfer level: Needs assistance Equipment used: Rolling walker (2 wheeled) Transfers: Sit to/from [employer] Sit to Stand: Min assist Stand pivot transfers: Min assist,Mod assist,+2 safety/equipment General transfer comment: Pt needed slight power up assist due to dizziness and instability upon standing.  Pt stood and pivoted to the recliner with min to mod assist for stability. Pt impulsive with movements as well.  Once pt sat down, pt was emotional and crying about his deficits and took incr time to calm him.  Daughters present. Ambulation/Gait General Gait Details: TBA    ADL: ADL Overall ADL's : Needs assistance/impaired Eating/Feeding: Supervision/ safety,Sitting Grooming: Minimal assistance,Sitting Upper Body Bathing: Minimal assistance,Sitting Lower Body Bathing: Moderate assistance,Sitting/lateral leans,Sit to/from stand Upper Body Dressing : Minimal assistance,Sitting Lower Body Dressing: Moderate assistance,Cueing for safety,Cueing for sequencing,Sitting/lateral leans,Sit to/from stand Lower Body Dressing Details (indicate cue type and reason): Pt able to reach feet, but unable to pull to stand without assist. Toilet Transfer: Minimal assistance,Stand-pivot,BSC Toilet Transfer Details (indicate cue type and reason): Pt immediately requiring BSC and OTR retrieived it and minA for transfer with impulsivity. Toileting- Clothing Manipulation and Hygiene: Moderate assistance,Sitting/lateral lean,Sit to/from stand,Cueing for safety Toileting - Clothing Manipulation Details (indicate cue type and reason): OTR performing most of pericare, but pt able to wipe dry bottom in standing, but unsafely due to  instability with RW. Functional mobility during ADLs: Minimal assistance,Cueing for safety,Rolling walker General ADL Comments: Pt limited by decreased strength, decreased ability to care for self and decreased mobility for safety. Pt with PTSD symptoms during our session with 3rd stand, pt abruptly trying to throw up and needing to sit down and use a blue bag. Pt became very emotional and eventually was able to calm down. RN in room and aware.  Cognition: Cognition Overall Cognitive Status: Impaired/Different from baseline Arousal/Alertness: Awake/alert Orientation Level: Oriented to person,Oriented to place Attention: Focused,Sustained Focused Attention: Appears intact Sustained Attention: Impaired Sustained Attention Impairment: Verbal basic Memory: Impaired Memory Impairment: Retrieval deficit,Decreased short term memory Decreased Short Term Memory: Verbal basic Cognition Arousal/Alertness: Awake/alert Behavior During Therapy: Impulsive Overall Cognitive Status: Impaired/Different from baseline Area  of Impairment: Following commands,Safety/judgement,Awareness Following Commands: Follows one step commands consistently Safety/Judgement: Decreased awareness of safety,Decreased awareness of deficits Awareness: Intellectual General Comments: Pt A/O, but very impulsive and unable to leave pt for a few seconds without him attempting to stand or do something instead of sit down.   Blood pressure (!) 148/87, pulse 74, temperature 98.8 F (37.1 C), temperature source Oral, resp. rate 11, height 5\' 10"  (1.778 m), weight 98.1 kg, SpO2 95 %. Physical Exam Vitals and nursing note reviewed.  Constitutional:      Appearance: Normal appearance.  HENT:     Head: Normocephalic and atraumatic.     Left Ear: Decreased hearing noted.     Ears:     Comments: Decreased hearing left >right.  Eyes:     Extraocular Movements: Extraocular movements intact.     Conjunctiva/sclera: Conjunctivae normal.      Pupils: Pupils are equal, round, and reactive to light.  Cardiovascular:     Rate and Rhythm: Normal rate and regular rhythm.     Heart sounds: No murmur heard.   Pulmonary:     Effort: Pulmonary effort is normal. No respiratory distress.     Breath sounds: Normal breath sounds.  Abdominal:     General: Abdomen is flat. Bowel sounds are normal. There is no distension.     Palpations: Abdomen is soft.  Musculoskeletal:     Cervical back: Normal range of motion.     Comments: No pain with upper limb or lower limb range of motion  Skin:    General: Skin is warm and dry.  Neurological:     Mental Status: He is alert and oriented to person, place, and time.     Cranial Nerves: No dysarthria.     Motor: No tremor or abnormal muscle tone.     Coordination: Coordination normal.     Comments: HOH. Able to answer questions and follow simple motor commands.   Sitting balance is good Motor strength is 5/5 bilateral deltoid bicep tricep grip hip flexion extensor ankle dorsiflexion plantarflexion   Psychiatric:        Mood and Affect: Mood normal.     Results for orders placed or performed during the hospital encounter of 06/25/20 (from the past 24 hour(s))  Glucose, capillary     Status: Abnormal   Collection Time: 06/26/20  7:12 PM  Result Value Ref Range   Glucose-Capillary 128 (H) 70 - 99 mg/dL  Glucose, capillary     Status: Abnormal   Collection Time: 06/26/20 11:08 PM  Result Value Ref Range   Glucose-Capillary 109 (H) 70 - 99 mg/dL  Glucose, capillary     Status: Abnormal   Collection Time: 06/27/20  3:12 AM  Result Value Ref Range   Glucose-Capillary 115 (H) 70 - 99 mg/dL  CBC     Status: Abnormal   Collection Time: 06/27/20  3:44 AM  Result Value Ref Range   WBC 7.6 4.0 - 10.5 K/uL   RBC 4.91 4.22 - 5.81 MIL/uL   Hemoglobin 14.6 13.0 - 17.0 g/dL   HCT 41.6 39.0 - 52.0 %   MCV 84.7 80.0 - 100.0 fL   MCH 29.7 26.0 - 34.0 pg   MCHC 35.1 30.0 - 36.0 g/dL   RDW 13.3  11.5 - 15.5 %   Platelets 95 (L) 150 - 400 K/uL   nRBC 0.3 (H) 0.0 - 0.2 %  Basic metabolic panel     Status: Abnormal   Collection Time: 06/27/20  3:44 AM  Result Value Ref Range   Sodium 139 135 - 145 mmol/L   Potassium 3.0 (L) 3.5 - 5.1 mmol/L   Chloride 105 98 - 111 mmol/L   CO2 25 22 - 32 mmol/L   Glucose, Bld 119 (H) 70 - 99 mg/dL   BUN 15 8 - 23 mg/dL   Creatinine, Ser 0.84 0.61 - 1.24 mg/dL   Calcium 9.2 8.9 - 10.3 mg/dL   GFR, Estimated >60 >60 mL/min   Anion gap 9 5 - 15  Glucose, capillary     Status: Abnormal   Collection Time: 06/27/20  7:16 AM  Result Value Ref Range   Glucose-Capillary 128 (H) 70 - 99 mg/dL  Magnesium     Status: None   Collection Time: 06/27/20 10:12 AM  Result Value Ref Range   Magnesium 1.9 1.7 - 2.4 mg/dL  Glucose, capillary     Status: Abnormal   Collection Time: 06/27/20 10:52 AM  Result Value Ref Range   Glucose-Capillary 133 (H) 70 - 99 mg/dL  Glucose, capillary     Status: Abnormal   Collection Time: 06/27/20  3:48 PM  Result Value Ref Range   Glucose-Capillary 119 (H) 70 - 99 mg/dL   CT ANGIO HEAD W OR WO CONTRAST  Result Date: 06/25/2020 CLINICAL DATA:  Follow-up acute intracranial hemorrhage. EXAM: CT ANGIOGRAPHY HEAD AND NECK TECHNIQUE: Multidetector CT imaging of the head and neck was performed using the standard protocol during bolus administration of intravenous contrast. Multiplanar CT image reconstructions and MIPs were obtained to evaluate the vascular anatomy. Carotid stenosis measurements (when applicable) are obtained utilizing NASCET criteria, using the distal internal carotid diameter as the denominator. CONTRAST:  73mL OMNIPAQUE IOHEXOL 350 MG/ML SOLN COMPARISON:  Prior head CT from earlier the same day. FINDINGS: CT HEAD FINDINGS Brain: No significant interval change in size and morphology of estimated 5 cc intraparenchymal hemorrhage centered at the posterior right temporal lobe. Mildly increased localized vasogenic edema  without significant regional mass effect. No other new acute intracranial hemorrhage. No other acute large vessel territory infarct. No mass lesion or midline shift. No hydrocephalus or extra-axial fluid collection. Chronic microvascular ischemic disease again noted. Vascular: No hyperdense vessel. Scattered vascular calcifications noted within the carotid siphons. Skull: Scalp soft tissues and calvarium demonstrate no new finding. Sinuses: Paranasal sinuses remain largely clear. Trace left mastoid effusion noted. Orbits: Globes and orbital soft tissues within normal limits. CTA NECK FINDINGS Aortic arch: Visualized arch normal in caliber with normal 3 vessel morphology. Mild plaque about the arch with no hemodynamically significant stenosis about the origin the great vessels. Right carotid system: Right common and internal carotid arteries widely patent without stenosis, dissection or occlusion. Minimal plaque about the right bifurcation for age. Left carotid system: Left common and internal carotid arteries widely patent without stenosis, dissection or occlusion. Minimal plaque about the left bifurcation for age. Vertebral arteries: Both vertebral arteries arise from the subclavian arteries. No proximal subclavian artery stenosis. Both vertebral arteries widely patent without stenosis, dissection or occlusion. Skeleton: No acute osseous finding. No discrete or worrisome osseous lesions. Other neck: No other acute soft tissue abnormality within the neck. No mass or adenopathy. Upper chest: Scattered atelectatic changes noted within the visualized lungs. Visualized upper chest demonstrates no other acute finding. Review of the MIP images confirms the above findings CTA HEAD FINDINGS Anterior circulation: Petrous segments widely patent. Mild atheromatous change within the carotid siphons without hemodynamically significant stenosis. A1 segments patent bilaterally. Normal anterior  communicating artery complex.  Anterior cerebral arteries patent to their distal aspects without stenosis. No M1 stenosis or occlusion. Normal MCA bifurcations. Distal MCA branches well perfused bilaterally. Posterior circulation: Both V4 segments patent to the vertebrobasilar junction, with the left being slightly dominant. Both PICA origins patent and normal. Basilar diminutive but patent to its distal aspect without stenosis. Superior cerebellar arteries patent bilaterally. Fetal type origin of the PCAs bilaterally. PCAs well perfused to their distal aspects without stenosis. Venous sinuses: Patent. Anatomic variants: None significant. There is an apparent 7 mm blush of contrast at the posterior right temporal lobe immediately superior to the right temporal lobe hemorrhage (series 11, image 86). Finding is favored to reflect a focal venous confluence. No other AVM or other vascular abnormality seen underlying the acute intraparenchymal bleed. Review of the MIP images confirms the above findings IMPRESSION: 1. No significant interval change in size and morphology of estimated 5 cc intraparenchymal hemorrhage centered at the posterior right temporal lobe. Mildly increased localized vasogenic edema without significant regional mass effect. No other new acute intracranial abnormality. 2. 7 mm blush of contrast at the posterior right temporal lobe, immediately adjacent to the right temporal lobe hemorrhage. While this is favored to reflect a focal venous confluence, confirmation with dedicated catheter directed angiography suggested for confirmatory purposes given its close proximity to the adjacent bleed. 3. Mild atherosclerotic change about the carotid bifurcations and carotid siphons without hemodynamically significant stenosis. 4. Otherwise negative CTA of the head and neck. No large vessel occlusion, hemodynamically significant stenosis, or other acute vascular abnormality. Electronically Signed   By: Jeannine Boga M.D.   On: 06/25/2020  19:47   CT ANGIO NECK W OR WO CONTRAST  Result Date: 06/25/2020 CLINICAL DATA:  Follow-up acute intracranial hemorrhage. EXAM: CT ANGIOGRAPHY HEAD AND NECK TECHNIQUE: Multidetector CT imaging of the head and neck was performed using the standard protocol during bolus administration of intravenous contrast. Multiplanar CT image reconstructions and MIPs were obtained to evaluate the vascular anatomy. Carotid stenosis measurements (when applicable) are obtained utilizing NASCET criteria, using the distal internal carotid diameter as the denominator. CONTRAST:  29mL OMNIPAQUE IOHEXOL 350 MG/ML SOLN COMPARISON:  Prior head CT from earlier the same day. FINDINGS: CT HEAD FINDINGS Brain: No significant interval change in size and morphology of estimated 5 cc intraparenchymal hemorrhage centered at the posterior right temporal lobe. Mildly increased localized vasogenic edema without significant regional mass effect. No other new acute intracranial hemorrhage. No other acute large vessel territory infarct. No mass lesion or midline shift. No hydrocephalus or extra-axial fluid collection. Chronic microvascular ischemic disease again noted. Vascular: No hyperdense vessel. Scattered vascular calcifications noted within the carotid siphons. Skull: Scalp soft tissues and calvarium demonstrate no new finding. Sinuses: Paranasal sinuses remain largely clear. Trace left mastoid effusion noted. Orbits: Globes and orbital soft tissues within normal limits. CTA NECK FINDINGS Aortic arch: Visualized arch normal in caliber with normal 3 vessel morphology. Mild plaque about the arch with no hemodynamically significant stenosis about the origin the great vessels. Right carotid system: Right common and internal carotid arteries widely patent without stenosis, dissection or occlusion. Minimal plaque about the right bifurcation for age. Left carotid system: Left common and internal carotid arteries widely patent without stenosis,  dissection or occlusion. Minimal plaque about the left bifurcation for age. Vertebral arteries: Both vertebral arteries arise from the subclavian arteries. No proximal subclavian artery stenosis. Both vertebral arteries widely patent without stenosis, dissection or occlusion. Skeleton: No acute osseous finding.  No discrete or worrisome osseous lesions. Other neck: No other acute soft tissue abnormality within the neck. No mass or adenopathy. Upper chest: Scattered atelectatic changes noted within the visualized lungs. Visualized upper chest demonstrates no other acute finding. Review of the MIP images confirms the above findings CTA HEAD FINDINGS Anterior circulation: Petrous segments widely patent. Mild atheromatous change within the carotid siphons without hemodynamically significant stenosis. A1 segments patent bilaterally. Normal anterior communicating artery complex. Anterior cerebral arteries patent to their distal aspects without stenosis. No M1 stenosis or occlusion. Normal MCA bifurcations. Distal MCA branches well perfused bilaterally. Posterior circulation: Both V4 segments patent to the vertebrobasilar junction, with the left being slightly dominant. Both PICA origins patent and normal. Basilar diminutive but patent to its distal aspect without stenosis. Superior cerebellar arteries patent bilaterally. Fetal type origin of the PCAs bilaterally. PCAs well perfused to their distal aspects without stenosis. Venous sinuses: Patent. Anatomic variants: None significant. There is an apparent 7 mm blush of contrast at the posterior right temporal lobe immediately superior to the right temporal lobe hemorrhage (series 11, image 86). Finding is favored to reflect a focal venous confluence. No other AVM or other vascular abnormality seen underlying the acute intraparenchymal bleed. Review of the MIP images confirms the above findings IMPRESSION: 1. No significant interval change in size and morphology of estimated  5 cc intraparenchymal hemorrhage centered at the posterior right temporal lobe. Mildly increased localized vasogenic edema without significant regional mass effect. No other new acute intracranial abnormality. 2. 7 mm blush of contrast at the posterior right temporal lobe, immediately adjacent to the right temporal lobe hemorrhage. While this is favored to reflect a focal venous confluence, confirmation with dedicated catheter directed angiography suggested for confirmatory purposes given its close proximity to the adjacent bleed. 3. Mild atherosclerotic change about the carotid bifurcations and carotid siphons without hemodynamically significant stenosis. 4. Otherwise negative CTA of the head and neck. No large vessel occlusion, hemodynamically significant stenosis, or other acute vascular abnormality. Electronically Signed   By: Jeannine Boga M.D.   On: 06/25/2020 19:47   MR BRAIN W WO CONTRAST  Result Date: 06/26/2020 CLINICAL DATA:  Cerebral hemorrhage suspected. EXAM: MRI HEAD WITHOUT AND WITH CONTRAST TECHNIQUE: Multiplanar, multiecho pulse sequences of the brain and surrounding structures were obtained without and with intravenous contrast. CONTRAST:  32mL GADAVIST GADOBUTROL 1 MMOL/ML IV SOLN COMPARISON:  Head CT Aug 25, 2020 FINDINGS: The study is partially degraded by motion. Brain: No significant interval change in size of the posterior right temporal lobe intraparenchymal hematoma measuring approximately 2.9 x 2.6 x 1.4 cm with mild surrounding vasogenic edema. There is effacement of the adjacent cerebral sulci without significant mass effect or midline shift. Prominent vessels are seen in the superior aspect of the hematoma (series 19, image 33) which may represent displaced leptomeningeal vessels although the possibility of a micro AVM cannot be entirely excluded. No focus of abnormal contrast enhancement identified. No acute infarct, hydrocephalus or extra-axial collection. Scattered and  confluent foci of T2 hyperintensity are seen within the white matter of the cerebral hemispheres, nonspecific, most likely related to chronic microangiopathic changes. Vascular: Normal flow voids. Skull and upper cervical spine: Normal marrow signal. Sinuses/Orbits: Negative. IMPRESSION: 1. No significant interval change in size of the posterior right temporal lobe intraparenchymal hematoma measuring approximately 2.9 x 2.6 x 1.4 cm with mild surrounding vasogenic edema. No focus of abnormal contrast enhancement identified. 2. Prominent vessels in the superior aspect of the hematoma may represent  displaced leptomeningeal vessels although the possibility of a micro AVM cannot be entirely excluded. Electronically Signed   By: Pedro Earls M.D.   On: 06/26/2020 08:37   EEG adult  Result Date: 06/26/2020 Lora Havens, MD     06/26/2020  4:57 PM Patient Name: Kemonte Ullman MRN: [id number] Epilepsy Attending: Lora Havens Referring Physician/Provider: Dr Amie [place] Date: 06/26/2020 Duration: 24.26 mins Patient history: 74yoM with right ICH. EEG to evaluate for seizure Level of alertness: Awake, asleep AEDs during EEG study: LEV Technical aspects: This EEG study was done with scalp electrodes positioned according to the 10-20 International system of electrode placement. Electrical activity was acquired at a sampling rate of 500Hz  and reviewed with a high frequency filter of 70Hz  and a low frequency filter of 1Hz . EEG data were recorded continuously and digitally stored. Description: The posterior dominant rhythm consists of 8-9 Hz activity of moderate voltage (25-35 uV) seen predominantly in posterior head regions, symmetric and reactive to eye opening and eye closing. Sleep was characterized by vertex waves, sleep spindles (12 to 14 Hz), maximal frontocentral region.  EEG showed intermittent right temporal 3 to 6 Hz theta-delta slowing. Sharp waves were also seen in right posterior temporal region.  Hyperventilation and photic stimulation were not performed.   ABNORMALITY -Intermittent slow, right temporal region -Sharp wave, right posterior temporal region. IMPRESSION: This study showed evidence of potential epileptogenicity arising from right posterior temporal region as well as cortical dysfunction in the right temporal region likely secondary to underlying bleed. No seizures were seen throughout the recording. Priyanka [name]   Overnight EEG with video  Result Date: 06/27/2020 Lora Havens, MD     06/27/2020 12:10 PM Patient Name: Kionte Baumgardner MRN: [id number] Epilepsy Attending: Lora Havens Referring Physician/Provider: Doreatha Massed, NP Duration: 06/26/2020 1540 to 06/27/2020 1017  Patient history: 60yoM with right ICH. EEG to evaluate for seizure  Level of alertness: Awake, asleep  AEDs during EEG study: LEV  Technical aspects: This EEG study was done with scalp electrodes positioned according to the 10-20 International system of electrode placement. Electrical activity was acquired at a sampling rate of 500Hz  and reviewed with a high frequency filter of 70Hz  and a low frequency filter of 1Hz . EEG data were recorded continuously and digitally stored.  Description: The posterior dominant rhythm consists of 8-9 Hz activity of moderate voltage (25-35 uV) seen predominantly in posterior head regions, symmetric and reactive to eye opening and eye closing. Sleep was characterized by vertex waves, sleep spindles (12 to 14 Hz), maximal frontocentral region.  EEG showed intermittent right temporal 3 to 6 Hz theta-delta slowing. Sharp waves were also seen in right posterior temporal region. Hyperventilation and photic stimulation were not performed.    ABNORMALITY -Intermittent slow, right temporal region -Sharp wave, right posterior temporal region.  IMPRESSION: This study showed evidence of potential epileptogenicity arising from right posterior temporal region as well as cortical  dysfunction in the right temporal region likely secondary to underlying bleed. No seizures were seen throughout the recording.  [name]   IR ANGIO INTRA EXTRACRAN SEL COM CAROTID INNOMINATE BILAT MOD SED  Result Date: 06/27/2020 CLINICAL DATA:  Right temporal intracranial hemorrhage EXAM: BILATERAL COMMON CAROTID AND INNOMINATE ANGIOGRAPHY COMPARISON:  CT angiogram of the head and neck of June 25, 2020. MEDICATIONS: Heparin none units IV. No antibiotic was administered within 1 hour of the procedure. ANESTHESIA/SEDATION: Precedex IV CONTRAST:  Isovue 300 approximately 65 mL FLUOROSCOPY TIME:  Fluoroscopy Time: 10 minutes 18 seconds (957 mGy). COMPLICATIONS: None immediate. TECHNIQUE: Informed written consent was obtained from the patient after a thorough discussion of the procedural risks, benefits and alternatives. All questions were addressed. Maximal Sterile Barrier Technique was utilized including caps, mask, sterile gowns, sterile gloves, sterile drape, hand hygiene and skin antiseptic. A timeout was performed prior to the initiation of the procedure. The right groin was prepped and draped in the usual sterile fashion. Thereafter using modified Seldinger technique, transfemoral access into the right common femoral artery was obtained without difficulty. Over a 0.035 inch guidewire, a 5 French Pinnacle sheath was inserted. Through this, and also over 0.035 inch guidewire, a 5 [place] JB 1 catheter was advanced to the aortic arch region and selectively positioned in the right common carotid artery, right subclavian artery, the left common carotid artery and the left subclavian artery. FINDINGS: The right common carotid arteriogram demonstrates moderate tortuosity of the proximal right common carotid artery. The right common carotid bifurcation demonstrates the right external carotid artery and its major branches to be widely patent. The right internal carotid artery at the bulb to the cranial skull  base is widely patent. The petrous, the cavernous and the supraclinoid segments are widely patent. The right posterior communicating artery is seen opacifying the right posterior cerebral artery and superior cerebellar artery distributions. The right middle cerebral artery demonstrates mild fusiform dilatation at the distal right M1 segment. The trifurcation branches, otherwise, opacify normally into the capillary and venous phases. The right anterior cerebral artery is seen to opacify normally into the capillary and venous phases. The right vertebral artery origin is widely patent. The vessel is seen to advanced to the occipital region where it supplies the right posterior-inferior cerebellar artery. Focal areas of caliber narrowing are seen involving the proximal 1/3 of the right posterior-inferior cerebellar artery. No contrast is seen the right vertebrobasilar junction distal to the posterior-inferior cerebellar artery. The left subclavian arteriogram demonstrates the origin of the left vertebral artery to be at the level of the left thyrocervical trunk. The vertebral artery is seen to opacify to the cranial skull base. Patency is seen of the right vertebrobasilar junction with a prominent left posterior-inferior cerebellar artery. Distal to this opacification of the basilar artery is noted with opacification of the left superior cerebellar artery. The left common carotid arteriogram demonstrates the left external carotid artery and its major branches to be widely patent. The left internal carotid artery at the bulb to the cranial skull base is widely patent. The petrous, cavernous and the supraclinoid segments are widely patent. The left middle cerebral artery and the left anterior cerebral artery opacify into the capillary and venous phases. Small focal areas of caliber irregularity involving A2 and A3 segment of the of the left pericallosal artery suggest intracranial arteriosclerosis. Noted is a dominant  left posterior communicating artery opacifying the left posterior cerebral artery and left superior cerebellar artery distributions. 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 Otherwise, no evidence of extracranial or intracranial dissections, occlusions or stenosis, dural AV fistula, AV shunting or of intraluminal filling defects. Venous outflow within normal limits. PLAN: Follow-up of diagnostic catheter arteriogram in 4 to 6 weeks time. Electronically Signed   By: Luanne Bras M.D.   On: 06/26/2020 13:18   IR  ANGIO VERTEBRAL SEL SUBCLAVIAN INNOMINATE BILAT MOD SED  Result Date: 06/27/2020 CLINICAL DATA:  Right temporal intracranial hemorrhage EXAM: BILATERAL COMMON CAROTID AND INNOMINATE ANGIOGRAPHY COMPARISON:  CT angiogram of the head and neck of June 25, 2020. MEDICATIONS: Heparin none units IV. No antibiotic was administered within 1 hour of the procedure. ANESTHESIA/SEDATION: Precedex IV CONTRAST:  Isovue 300 approximately 65 mL FLUOROSCOPY TIME:  Fluoroscopy Time: 10 minutes 18 seconds (957 mGy). COMPLICATIONS: None immediate. TECHNIQUE: Informed written consent was obtained from the patient after a thorough discussion of the procedural risks, benefits and alternatives. All questions were addressed. Maximal Sterile Barrier Technique was utilized including caps, mask, sterile gowns, sterile gloves, sterile drape, hand hygiene and skin antiseptic. A timeout was performed prior to the initiation of the procedure. The right groin was prepped and draped in the usual sterile fashion. Thereafter using modified Seldinger technique, transfemoral access into the right common femoral artery was obtained without difficulty. Over a 0.035 inch guidewire, a 5 French Pinnacle  sheath was inserted. Through this, and also over 0.035 inch guidewire, a 5 [place] JB 1 catheter was advanced to the aortic arch region and selectively positioned in the right common carotid artery, right subclavian artery, the left common carotid artery and the left subclavian artery. FINDINGS: The right common carotid arteriogram demonstrates moderate tortuosity of the proximal right common carotid artery. The right common carotid bifurcation demonstrates the right external carotid artery and its major branches to be widely patent. The right internal carotid artery at the bulb to the cranial skull base is widely patent. The petrous, the cavernous and the supraclinoid segments are widely patent. The right posterior communicating artery is seen opacifying the right posterior cerebral artery and superior cerebellar artery distributions. The right middle cerebral artery demonstrates mild fusiform dilatation at the distal right M1 segment. The trifurcation branches, otherwise, opacify normally into the capillary and venous phases. The right anterior cerebral artery is seen to opacify normally into the capillary and venous phases. The right vertebral artery origin is widely patent. The vessel is seen to advanced to the occipital region where it supplies the right posterior-inferior cerebellar artery. Focal areas of caliber narrowing are seen involving the proximal 1/3 of the right posterior-inferior cerebellar artery. No contrast is seen the right vertebrobasilar junction distal to the posterior-inferior cerebellar artery. The left subclavian arteriogram demonstrates the origin of the left vertebral artery to be at the level of the left thyrocervical trunk. The vertebral artery is seen to opacify to the cranial skull base. Patency is seen of the right vertebrobasilar junction with a prominent left posterior-inferior cerebellar artery. Distal to this opacification of the basilar artery is noted with opacification of the  left superior cerebellar artery. The left common carotid arteriogram demonstrates the left external carotid artery and its major branches to be widely patent. The left internal carotid artery at the bulb to the cranial skull base is widely patent. The petrous, cavernous and the supraclinoid segments are widely patent. The left middle cerebral artery and the left anterior cerebral artery opacify into the capillary and venous phases. Small focal areas of caliber irregularity involving A2 and A3 segment of the of the left pericallosal artery suggest intracranial arteriosclerosis. Noted is a dominant left posterior communicating artery opacifying the left posterior cerebral artery and left superior cerebellar artery distributions. 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 Otherwise, no evidence of extracranial or intracranial dissections, occlusions or stenosis, dural AV fistula, AV shunting or of intraluminal filling defects. Venous outflow within normal limits. PLAN: Follow-up of diagnostic catheter arteriogram in 4 to 6 weeks time. Electronically Signed   By: Luanne Bras M.D.   On: 06/26/2020 13:18     Assessment/Plan: Diagnosis: Right temporal intraparenchymal hemorrhage 1. Does the need for close, 24 hr/day medical supervision in concert with the patient's rehab needs make it unreasonable for this patient to be served in a less intensive setting? Yes 2. Co-Morbidities requiring supervision/potential complications: Difficult to control hypertension, seizure disorder 3. Due to bladder management, bowel management, safety, skin/wound care, disease management, medication administration, pain management and patient education, does the patient require 24 hr/day rehab  nursing? Yes 4. Does the patient require coordinated care of a physician, rehab nurse, therapy disciplines of PT, OT, SLP to address physical and functional deficits in the context of the above medical diagnosis(es)? Yes Addressing deficits in the following areas: balance, endurance, locomotion, strength, transferring, bowel/bladder control, bathing, dressing, feeding, grooming, toileting, cognition, speech, language, psychosocial support and specch for higher level cognitive assessment 5. Can the patient actively participate in an intensive therapy program of at least 3 hrs of therapy per day at least 5 days per week? Yes 6. The potential for patient to make measurable gains while on inpatient rehab is good 7. Anticipated functional outcomes upon discharge from inpatient rehab are modified independent and supervision  with PT, modified independent and supervision with OT, modified independent with SLP. 8. Estimated rehab length of stay to reach the above functional goals is: 10-14d 9. Anticipated discharge destination: Home 10. Overall Rehab/Functional Prognosis: excellent  RECOMMENDATIONS: This patient's condition is appropriate for continued rehabilitative care in the following setting: CIR Patient has agreed to participate in recommended program. Pt and family desire Rehab in [place] [facility] Note that insurance prior authorization may be required for reimbursement for recommended care.  Comment: BP needs to be controlled off clevidipine and agitation of dexmedetomidine Bary Leriche, PA-C 06/27/2020   "I have personally performed a face to face diagnostic evaluation of this patient.  Additionally, I have reviewed and concur with the physician assistant's documentation above." Charlett Blake M.D. [facility] Fellow [employer] Diplomate Am Board of Electrodiagnostic Med Fellow Am Board of Interventional Pain

## 2020-06-27 NOTE — Procedures (Addendum)
Patient Name: Mathew Malone  MRN: [id number]  Epilepsy Attending: Lora Havens  Referring Physician/Provider: Doreatha Massed, NP Duration: 06/26/2020 1540 to 06/27/2020 1017  Patient history: 65yoM with right ICH. EEG to evaluate for seizure  Level of alertness: Awake, asleep  AEDs during EEG study: LEV  Technical aspects: This EEG study was done with scalp electrodes positioned according to the 10-20 International system of electrode placement. Electrical activity was acquired at a sampling rate of 500Hz  and reviewed with a high frequency filter of 70Hz  and a low frequency filter of 1Hz . EEG data were recorded continuously and digitally stored.   Description: The posterior dominant rhythm consists of 8-9 Hz activity of moderate voltage (25-35 uV) seen predominantly in posterior head regions, symmetric and reactive to eye opening and eye closing. Sleep was characterized by vertex waves, sleep spindles (12 to 14 Hz), maximal frontocentral region.  EEG showed intermittent right temporal 3 to 6 Hz theta-delta slowing. Sharp waves were also seen in right posterior temporal region.  Hyperventilation and photic stimulation were not performed.     ABNORMALITY -Intermittent slow, right temporal region -Sharp wave, right posterior temporal region.   IMPRESSION: This study showed evidence of potential epileptogenicity arising from right posterior temporal region as well as cortical dysfunction in the right temporal region likely secondary to underlying bleed. No seizures were seen throughout the recording.  Angelic Schnelle [name]

## 2020-06-27 NOTE — Progress Notes (Signed)
This nurse has faxed "A medical release" form to [place] requesting medical records. Unable to go through successfully, probably due to being after 5pm. Will communicate with at shift change for the day RN to try again tomorrow morning. Documentation is in the hard chart.

## 2020-06-27 NOTE — Progress Notes (Signed)
NAME:  Mathew Malone, MRN:  [id number], DOB:  1945-06-09, LOS: 2 ADMISSION DATE:  06/25/2020, CONSULTATION DATE:  06/27/20 REFERRING MD:  Amie Portland CHIEF COMPLAINT:  Bradycardia, need for sedation   Brief History:  75 year old male presented to ED via EMS after new onset of seizure with fall at home, residual L-sided weakness, R gaze deviation and aphasia. CT/MRI demonstrated R temporal intraparenchymal hemorrhage with c/f AVM (cerebral arteriogram negative). PCCM consulted for bradycardia/sedation management.  History of Present Illness:  Mathew Malone is a 75 year old male who presented to [facility] ED via EMS after he "collapsed" and fell at home with concern for seizure-like activity. At the time of the seizure, per daughter patient reportedly had left-sided weakness and slurred speech and was incontinent. He was brought to [facility] ED where CT and MRI Brain imaging demonstrated a R temporal intraparenchymal hemorrhage with concern for AVM. Patient subsequently underwent cerebral arteriogram with Neuro IR, which was grossly negative.   Patient was transferred to the ICU and Precedex was initiated for agitation; he subsequently became bradycardic to the 40s and PCCM was consulted for assistance with sedation management.  Past Medical History:   Past Medical History:  Diagnosis Date  . Arthritis    "fingers; knees; right shoulder" (04/28/2013)  . Cancer [facility]) 2008   prostate  . Exertional shortness of breath    "here lately" (04/28/2013)  . Gout   . HOH (hard of hearing)    bilateral  . Hyperlipidemia   . Hypertension    stress test- 2011  . OSA on CPAP   . Post traumatic stress disorder (PTSD)    Significant Hospital Events:  3/06 - Admitted 3/07 - PCCM consult for bradycardia in the setting of Precedex initiation 3/8 Mental slightly improved, bradycardic on Precedex so discontinued  Consults:  PCCM  Procedures:    Significant Diagnostic Tests:   3/6 CT Head Code Stroke >> 2.9 x 2.7 x 1.3  cm acute intraparenchymal hemorrhage centered at the posterior R temporal lobe (estimated volume 5 cc), mild localized edema without significant regional mass effect, underlying moderate to advanced chronic microvascular ischemic disease.  3/6 CT Angio Head/Neck >> no significant interval change in size/morphology of estimated 5cc intraparenchymal hemorrhage centered at the posterior R temporal lobe, mildly increased localized vasogenic edema without significant regional mass effect, no other new acute intracranial abnormality; 20mm blush of contrast at posterior R temporal lobe favored to reflect a focal venous confluence, mild atherosclerotic change at carotid bifurcations, otherwise negative - no large vessel occlusion or hemodynamically significant stenosis  3/7 MRI Brain w/ w/o contrast >> no significant interval change in the size of the posterior R temporal lobe intraparenchymal hematoma measuring 2.9 x 2.6 x 1.4cm with mild surrounding vasogenic edema, no focus of abnormal contrast enhancement identified; prominent vessels in the superior aspect of the hematoma, cannot exclude micro AVM  3/7 Cerebral Arteriogram (4 Vessel) >> Marred by motion artifact, grossly no evidence of aneurysm, AV shunting, DAVF, extracranial/intracranial dissections or of occlusions/stenosis; venous outflow intact  3/7 MRI Brain>>No significant interval change in size of the posterior right temporal lobe intraparenchymal hematoma measuring approximately 2.9 x 2.6 x 1.4 cm with mild surrounding vasogenic edema. No focus of abnormal contrast enhancement identified.  Micro Data:  3/6 COVID negative 3/6 MRSA PCR negative  Antimicrobials:    Interim History / Subjective:   Slightly improved mental status today, Precedex causing bradycardia so discontinued.  No further signs of seizure activity, remains on Cleviprex  Objective  Blood pressure 125/74, pulse (!) 56, temperature 98.3 F (36.8 C), temperature source  Oral, resp. rate (!) 26, height 5\' 10"  (1.778 m), weight 98.1 kg, SpO2 94 %.        Intake/Output Summary (Last 24 hours) at 06/27/2020 0730 Last data filed at 06/27/2020 0300 Gross per 24 hour  Intake 2159.54 ml  Output 450 ml  Net 1709.54 ml   Filed Weights   06/25/20 0300  Weight: 98.1 kg    General: Elderly male, awake, slightly restless HEENT: MM pink/moist Neuro: Awake, alert, slightly disoriented and tangential, following commands, moving all extremities without extremity weakness or facial droop  CV: s1s2 bradycardic, no m/r/g PULM: On room air, no respiratory distress, no rhonchi or wheezing GI: soft, bsx4 active  Extremities: warm/dry, no edema  Skin: no rashes or lesions      Resolved Hospital Problem list     Assessment & Plan:    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 CT/MRI Brain demonstrated R temporal ICH (as above). S/p Neuro IR cerebral arteriogram which was grossly normal without AVM noted P: -Neurology primary, MRI without further extension or obvious AVM -No further seizure activity, EEG discontinued, continue Keppra and Ativan as needed -Pursue normoglycemia -Passed swallow screen, start clear liquid diet -Continue Cleviprex, home amlodipine started today, wean as able.  Goal SBP<160 -Echo with EF 50 to 55% and moderate biatrial dilation    Bradycardia likely in the setting of sedation Bradycardia into the 40s noted on initiation of Precedex for agitation. PCCM consulted for management of sedation. P: -Persistent bradycardia this morning, Precedex discontinued, trial low-dose Klonopin -Home Abilify and Zoloft resumed at decreased doses -Continue telemetry, has some PVCs, check magnesium   Hypertension History of hypertension with SBPs reportedly > 200 on EMS arrival. Home regimen includes Coreg,  Lisinopril, HCTZ. P: -Continue Cleviprex, start home metoprolol, goal SBP less than 160  Hypokalemia - Supplement PRN for K > 4 -Magnesium  History of PTSD -Continue home Abilify and Zoloft with Klonopin as needed  Best practice (evaluated daily)  Diet: Clear liquid Pain/Anxiety/Delirium protocol (if indicated): Klonopin VAP protocol (if indicated): N/A DVT prophylaxis: Per Neuro, hold in the setting of active ICH GI prophylaxis: PPI Glucose control: SSI Mobility: Bedrest Disposition: ICU Family communication: Patient's daughter updated at the bedside  Goals of Care:  Last date of multidisciplinary goals of care discussion: Per primary team Family and staff present:  Summary of discussion:  Follow up goals of care discussion due: 3/14 Code Status: Full  Labs   CBC: Recent Labs  Lab 06/25/20 0340 06/25/20 0350 06/26/20 0217 06/27/20 0344  WBC  --  14.1* 9.3 7.6  NEUTROABS  --  10.3*  --   --   HGB 18.0* 17.0 15.5 14.6  HCT 53.0* 52.9* 44.5 41.6  MCV  --  89.5 84.1 84.7  PLT  --  145* 102* 95*    Basic Metabolic Panel: Recent Labs  Lab 06/25/20 0340 06/25/20 0350 06/25/20 0800 06/26/20 0217 06/27/20 0344  NA 139 140  --  139 139  K 3.1* 3.0*  --  3.1* 3.0*  CL 100 98  --  103 105  CO2  --  24  --  24 25  GLUCOSE 201* 222*  --  140* 119*  BUN 22 15  --  16 15  CREATININE 1.00 1.12  --  0.99 0.84  CALCIUM  --  9.7  --  9.4 9.2  MG  --   --  1.9  --   --   PHOS  --   --  2.5  --   --    GFR: Estimated Creatinine Clearance: 93.3 mL/min (by C-G formula based on SCr of 0.84 mg/dL). Recent Labs  Lab 06/25/20 0350 06/26/20 0217 06/27/20 0344  WBC 14.1* 9.3 7.6    Liver Function Tests: Recent Labs  Lab 06/25/20 0350  AST 35  ALT 23  ALKPHOS 51  BILITOT 1.1  PROT 7.5  ALBUMIN 4.7   No results for input(s): LIPASE, AMYLASE in the last 168 hours. No results for input(s): AMMONIA in the last 168 hours.  ABG    Component Value Date/Time    HCO3 29.3 (H) 05/06/2012 1508   TCO2 27 06/25/2020 0340   O2SAT 45.0 05/06/2012 1508     Coagulation Profile: Recent Labs  Lab 06/25/20 0350  INR 1.0    Cardiac Enzymes: No results for input(s): CKTOTAL, CKMB, CKMBINDEX, TROPONINI in the last 168 hours.  HbA1C: Hgb A1c MFr Bld  Date/Time Value Ref Range Status  06/25/2020 08:00 AM 5.6 4.8 - 5.6 % Final    Comment:    (NOTE) Pre diabetes:          5.7%-6.4%  Diabetes:              >6.4%  Glycemic control for   <7.0% adults with diabetes     CBG: Recent Labs  Lab 06/26/20 1537 06/26/20 1912 06/26/20 2308 06/27/20 0312 06/27/20 0716  GLUCAP 115* 128* 109* 115* 128*    Review of Systems:   Patient is encephalopathic and/or intubated. Therefore history has been obtained from chart review.   Past Medical History:  He,  has a past medical history of Arthritis, Cancer ([place]) (2008), Exertional shortness of breath, Gout, HOH (hard of hearing), Hyperlipidemia, Hypertension, OSA on CPAP, and Post traumatic stress disorder (PTSD).   Surgical History:   Past Surgical History:  Procedure Laterality Date  . INGUINAL HERNIA REPAIR Right 1970's  . JOINT REPLACEMENT    . LEFT HEART CATHETERIZATION WITH CORONARY ANGIOGRAM N/A 04/29/2013   Procedure: LEFT HEART CATHETERIZATION WITH CORONARY ANGIOGRAM;  Surgeon: Laverda Page, MD;  Location: [facility] CATH LAB;  Service: Cardiovascular;  Laterality: N/A;  . PENILE PROSTHESIS IMPLANT  04/05/2011   Procedure: PENILE PROTHESIS INFLATABLE;  Surgeon: Claybon Jabs, MD;  [facility];  Service: Urology;  Laterality: N/A;   EXPLORATION AND REVISION OF INFLATABLE PENILE   PROSTHESIS    PUMP  . PENILE PROSTHESIS IMPLANT  10/21/2011   Procedure: PENILE PROTHESIS INFLATABLE;  Surgeon: Claybon Jabs, MD;  Location: [facility];  Service: Urology;  Laterality: N/A;     . PENILE PROSTHESIS PLACEMENT  12/2010  . ROBOT ASSISTED LAPAROSCOPIC RADICAL  PROSTATECTOMY  2008  . SHOULDER OPEN ROTATOR CUFF REPAIR Bilateral 2007-2008   "twice on each side"  . TONSILLECTOMY    . TOTAL KNEE ARTHROPLASTY Bilateral 2011-2012   lt knee-2012  / rt knee -2011     Social History:   reports that he quit smoking about 49 years ago. His smoking use included cigarettes. He has a 0.48 pack-year smoking history. He has never used smokeless tobacco. He reports current alcohol use of about 1.0 standard drink of alcohol per week. He reports current drug use. Drug: Marijuana.   Family History:  His family history is not  on file.   Allergies No Known Allergies   Home Medications  Prior to Admission medications   Medication Sig Start Date End Date Taking? Authorizing Provider  acetaminophen (TYLENOL) 500 MG tablet Take 1,000 mg by mouth every 6 (six) hours as needed for mild pain.   Yes [provider]  allopurinol (ZYLOPRIM) 100 MG tablet Take 200 mg by mouth daily.   Yes [provider]  ascorbic acid (VITAMIN C) 500 MG tablet Take 500 mg by mouth 2 (two) times daily.   Yes [provider]  aspirin 81 MG chewable tablet Chew 162 mg by mouth at bedtime.   Yes [provider]  calcium-vitamin D (OSCAL WITH D) 500-200 MG-UNIT per tablet Take 1 tablet by mouth daily. 04/29/13  Yes Adrian Prows, MD  [place] 450 MG CAPS Take 450 mg by mouth daily.   Yes [provider]  cetirizine (ZYRTEC) 10 MG tablet Take 10 mg by mouth daily.   Yes [provider]  DHA-Vitamin C-Lutein (EYE HEALTH FORMULA PO) Take 470 mg by mouth daily. Eye bright tab   Yes [provider]  gabapentin (NEURONTIN) 600 MG tablet Take 1,200 mg by mouth 3 (three) times daily.   Yes [provider]  galantamine (RAZADYNE ER) 8 MG 24 hr capsule Take 8 mg by mouth daily with breakfast.   Yes [provider]  hydrochlorothiazide (HYDRODIURIL) 25 MG tablet Take 12.5 mg by mouth daily. 05/22/12 12/06/20 Yes [provider]  ibuprofen (ADVIL) 800 MG tablet Take 800 mg by mouth every 8 (eight) hours as needed. 06/06/20  Yes [provider]  Iron-Vitamins (GERITOL) LIQD Take 1 Dose by mouth daily.   Yes [provider]  lisinopril (ZESTRIL) 40 MG tablet Take 40 mg by mouth daily.   Yes [provider]  mirabegron ER (MYRBETRIQ) 50 MG TB24 tablet Take 50 mg by mouth daily.   Yes [provider]  Misc Natural Products (TOTAL MEMORY & FOCUS FORMULA PO) Take 1 tablet by mouth daily.   Yes [provider]  OVER THE COUNTER MEDICATION Take 1 tablet by mouth daily. Guarana energizer   Yes [provider]  PEG-KCl-NaCl-NaSulf-Na Asc-C (PLENVU) 140 g SOLR Take 140 g by mouth See admin instructions. As directed for bowel prep 05/25/20  Yes [provider]  polyvinyl alcohol (LIQUIFILM TEARS) 1.4 % ophthalmic solution Place into both eyes 2 (two) times daily as needed for dry eyes.   Yes [provider]  sertraline (ZOLOFT) 100 MG tablet Take 1 tablet (100 mg total) by mouth 2 (two) times daily. Patient taking differently: Take 100 mg by mouth daily. 04/29/13  Yes Adrian Prows, MD  simvastatin (ZOCOR) 40 MG tablet Take 20 mg by mouth daily.   Yes [provider]  ARIPiprazole (ABILIFY) 30 MG tablet Take 30 mg by mouth daily.    [provider]  carvedilol (COREG) 6.25 MG tablet Take 1 tablet (6.25 mg total) by mouth 2 (two) times daily with a meal. Patient not taking: No sig reported 04/29/13   Adrian Prows, MD  HYDROcodone-acetaminophen (VICODIN) 5-500 MG per tablet Take 1 tablet by mouth every 6 (six) hours as needed. For pain Patient not taking: No sig reported 04/29/13   Adrian Prows, MD  Phentermine-Topiramate (QSYMIA) 7.5-46 MG CP24 Take 1 capsule by mouth daily. Take 1 capsule by mouth daily start day 15 after 14 day course. Patient not taking: No sig reported 04/29/13   Adrian Prows, MD  Critical care time: 38 minutes   Otilio Carpen  Gleason, PA-C [facility] See Amion for pager If no response to pager , please call [phone] until 7pm After 7:00 pm call Elink  [phone]

## 2020-06-27 NOTE — Progress Notes (Signed)
K 3.0 Electrolytes replaced per protocol 

## 2020-06-27 NOTE — Progress Notes (Signed)
Rehab Admissions Coordinator Note:  Patient was screened by Cleatrice Burke for appropriateness for an Inpatient Acute Rehab Consult per therapy recs. .  At this time, we are recommending Inpatient Rehab consult. I will place order per protocol.  Cleatrice Burke RN MSN 06/27/2020, 4:06 PM  I can be reached at [phone].

## 2020-06-27 NOTE — Progress Notes (Signed)
vLTM EEG complete. No skin breakdown 

## 2020-06-27 NOTE — Evaluation (Signed)
Speech Language Pathology Evaluation Patient Details Name: Mathew Malone MRN: [id number] DOB: 1946/04/10 Today's Date: 06/27/2020 Time: 1450-1510 SLP Time Calculation (min) (ACUTE ONLY): 20 min  Problem List:  Patient Active Problem List   Diagnosis Date Noted  . 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 Unstable angina pectoris ([place]) 04/28/2013  . Malfunction of penile prosthesis ([place]) 10/21/2011   Past Medical History:  Past Medical History:  Diagnosis Date  . Arthritis    "fingers; knees; right shoulder" (04/28/2013)  . Cancer [facility]) 2008   prostate  . Exertional shortness of breath    "here lately" (04/28/2013)  . Gout   . HOH (hard of hearing)    bilateral  . Hyperlipidemia   . Hypertension    stress test- 2011  . OSA on CPAP   . Post traumatic stress disorder (PTSD)    Past Surgical History:  Past Surgical History:  Procedure Laterality Date  . INGUINAL HERNIA REPAIR Right 1970's  . IR ANGIO INTRA EXTRACRAN SEL COM CAROTID INNOMINATE BILAT MOD SED  06/26/2020  . IR ANGIO VERTEBRAL SEL SUBCLAVIAN INNOMINATE BILAT MOD SED  06/26/2020  . JOINT REPLACEMENT    . LEFT HEART CATHETERIZATION WITH CORONARY ANGIOGRAM N/A 04/29/2013   Procedure: LEFT HEART CATHETERIZATION WITH CORONARY ANGIOGRAM;  Surgeon: Laverda Page, MD;  Location: [facility] CATH LAB;  Service: Cardiovascular;  Laterality: N/A;  . PENILE PROSTHESIS IMPLANT  04/05/2011   Procedure: PENILE PROTHESIS INFLATABLE;  Surgeon: Claybon Jabs, MD;  Location: [facility];  Service: Urology;  Laterality: N/A;   EXPLORATION AND REVISION OF INFLATABLE PENILE   PROSTHESIS    PUMP  . PENILE PROSTHESIS IMPLANT  10/21/2011   Procedure: PENILE PROTHESIS INFLATABLE;  Surgeon: Claybon Jabs, MD;  Location: [facility];  Service: Urology;  Laterality: N/A;     . PENILE PROSTHESIS PLACEMENT  12/2010  . ROBOT ASSISTED LAPAROSCOPIC RADICAL PROSTATECTOMY  2008  . SHOULDER OPEN ROTATOR CUFF REPAIR Bilateral 2007-2008    "twice on each side"  . TONSILLECTOMY    . TOTAL KNEE ARTHROPLASTY Bilateral 2011-2012   lt knee-2012  / rt knee -2011   HPI:  75yo male admitted 06/25/20 after with left side weakness and fall. PMH: arthritis, prostate cancer, SOB, gout, HOH, HLD, HTN, OSA on CPAP, PTSD. Pt had 30 sec grand mal seizure 06/25/20 at ~0400. HeadCT = acute intraparenchymal hemorrhage posterior right frontal lobe, moderate to advanced chronic microvascular ischemic disease.   Assessment / Plan / Recommendation Clinical Impression  The Mini-Mental State Examination (MMSE) was administered. Pt scored 20/30, raising concern for neurocognitive deficits. Points were lost on orientation to time, attention/calculation (unable to demonstrate ability to complete serial 7's or spell "world" backward), delayed recall (recall 2/3 words after delay), and following complex directions (likely due to impaired attention). Pt also had difficulty with clock drawing task, which raises concern for executive functions such as organization and planning. Recommend continued ST intervention at next venue to maximize safety and independence and minimize caregiver burden, given level of independence prior to admit.    SLP Assessment  SLP Recommendation/Assessment: All further Speech Language Pathology needs can be addressed in the next venue of care  SLP Visit Diagnosis: Cognitive communication deficit (R41.841)    Follow Up Recommendations  Inpatient Rehab;Skilled Nursing facility       SLP Evaluation Cognition  Overall Cognitive Status: No family/caregiver present to determine baseline cognitive functioning Arousal/Alertness: Awake/alert Orientation Level: Oriented to person;Oriented to place;Disoriented to time Attention: Focused;Sustained  Focused Attention: Appears intact Sustained Attention: Impaired Sustained Attention Impairment: Verbal basic Memory: Impaired Memory Impairment: Retrieval deficit;Decreased short term  memory Decreased Short Term Memory: Verbal basic       Comprehension  Auditory Comprehension Overall Auditory Comprehension: Appears within functional limits for tasks assessed Reading Comprehension Reading Status: Within funtional limits    Expression Expression Primary Mode of Expression: Verbal Verbal Expression Overall Verbal Expression: Appears within functional limits for tasks assessed Written Expression Dominant Hand:  (pt reported being right handed, but wrote sentence with left hand) Written Expression:  (Pt able to write a short sentence, but writing was illegible)   Oral / Motor  Oral Motor/Sensory Function Overall Oral Motor/Sensory Function: Within functional limits Motor Speech Overall Motor Speech: Appears within functional limits for tasks assessed Intelligibility: Intelligible   GO                   [name], [facility], [place] Speech Language Pathologist Office: [phone]  Shonna Chock 06/27/2020, 3:23 PM

## 2020-06-27 NOTE — Discharge Instructions (Signed)
Femoral Site Care This sheet gives you information about how to care for yourself after your procedure. Your health care provider may also give you more specific instructions. If you have problems or questions, contact your health care provider. What can I expect after the procedure? After the procedure, it is common to have:  Bruising that usually fades within 1-2 weeks.  Tenderness at the site. Follow these instructions at home: Wound care 1. Follow instructions from your health care provider about how to take care of your insertion site. Make sure you: ? Wash your hands with soap and water before you change your bandage (dressing). If soap and water are not available, use hand sanitizer. ? Change your dressing as directed- pressure dressing removed 24 hours post-procedure (and switch for bandaid), bandaid removed 72 hours post-procedure 2. Do not take baths, swim, or use a hot tub for 7 days post-procedure. 3. You may shower 48 hours after the procedure or as told by your health care provider. ? Gently wash the site with plain soap and water. ? Pat the area dry with a clean towel. ? Do not rub the site. This may cause bleeding. 4. Check your site every day for signs of infection. Check for: ? Redness, swelling, or pain. ? Fluid or blood. ? Warmth. ? Pus or a bad smell. Activity  Do not stoop, bend, or lift anything that is heavier than 10 lb (4.5 kg) for 2 weeks post-procedure.  Do not drive self for 2 weeks post-procedure. Contact a health care provider if you have:  A fever or chills.  You have redness, swelling, or pain around your insertion site. Get help right away if:  The catheter insertion area swells very fast.  You pass out.  You suddenly start to sweat or your skin gets clammy.  The catheter insertion area is bleeding, and the bleeding does not stop when you hold steady pressure on the area.  The area near or just beyond the catheter insertion site becomes  pale, cool, tingly, or numb. These symptoms may represent a serious problem that is an emergency. Do not wait to see if the symptoms will go away. Get medical help right away. Call your local emergency services (911 in the [place] Do not drive yourself to the hospital.  This information is not intended to replace advice given to you by your health care provider. Make sure you discuss any questions you have with your health care provider. Document Revised: 04/21/2017 Document Reviewed: 04/21/2017 Elsevier Patient Education  2020 [employer] Inc[employer] 

## 2020-06-28 DIAGNOSIS — I619 Nontraumatic intracerebral hemorrhage, unspecified: Secondary | ICD-10-CM | POA: Diagnosis not present

## 2020-06-28 DIAGNOSIS — I161 Hypertensive emergency: Secondary | ICD-10-CM | POA: Diagnosis not present

## 2020-06-28 LAB — CBC
HCT: 43.2 % (ref 39.0–52.0)
Hemoglobin: 14.8 g/dL (ref 13.0–17.0)
MCH: 29.1 pg (ref 26.0–34.0)
MCHC: 34.3 g/dL (ref 30.0–36.0)
MCV: 84.9 fL (ref 80.0–100.0)
Platelets: 92 10*3/uL — ABNORMAL LOW (ref 150–400)
RBC: 5.09 MIL/uL (ref 4.22–5.81)
RDW: 13.2 % (ref 11.5–15.5)
WBC: 7.9 10*3/uL (ref 4.0–10.5)
nRBC: 0 % (ref 0.0–0.2)

## 2020-06-28 LAB — GLUCOSE, CAPILLARY
Glucose-Capillary: 114 mg/dL — ABNORMAL HIGH (ref 70–99)
Glucose-Capillary: 124 mg/dL — ABNORMAL HIGH (ref 70–99)
Glucose-Capillary: 128 mg/dL — ABNORMAL HIGH (ref 70–99)
Glucose-Capillary: 131 mg/dL — ABNORMAL HIGH (ref 70–99)
Glucose-Capillary: 120 mg/dL — ABNORMAL HIGH (ref 70–99)

## 2020-06-28 LAB — BASIC METABOLIC PANEL
Anion gap: 9 (ref 5–15)
BUN: 7 mg/dL — ABNORMAL LOW (ref 8–23)
CO2: 25 mmol/L (ref 22–32)
Calcium: 8.8 mg/dL — ABNORMAL LOW (ref 8.9–10.3)
Chloride: 101 mmol/L (ref 98–111)
Creatinine, Ser: 0.74 mg/dL (ref 0.61–1.24)
GFR, Estimated: >60 mL/min (ref >60)
Glucose, Bld: 114 mg/dL — ABNORMAL HIGH (ref 70–99)
Potassium: 2.8 mmol/L — ABNORMAL LOW (ref 3.5–5.1)
Sodium: 135 mmol/L (ref 135–145)

## 2020-06-28 LAB — MAGNESIUM: Magnesium: 1.6 mg/dL — ABNORMAL LOW (ref 1.7–2.4)

## 2020-06-28 MED ORDER — HYDROCHLOROTHIAZIDE 12.5 MG PO CAPS
12.5000 mg | ORAL_CAPSULE | Freq: Every day | ORAL | Status: DC
  Administered 2020-06-28 – 2020-07-01 (×4): 12.5 mg via ORAL
  Filled 2020-06-28 (×4): qty 1

## 2020-06-28 MED ORDER — POTASSIUM CHLORIDE 10 MEQ/100ML IV SOLN
10.0000 meq | INTRAVENOUS | Status: AC
  Administered 2020-06-28 (×3): 10 meq via INTRAVENOUS
  Filled 2020-06-28 (×4): qty 100

## 2020-06-28 MED ORDER — PANTOPRAZOLE SODIUM 40 MG PO TBEC
40.0000 mg | DELAYED_RELEASE_TABLET | Freq: Every day | ORAL | Status: DC
  Administered 2020-06-28 – 2020-06-30 (×3): 40 mg via ORAL
  Filled 2020-06-28 (×3): qty 1

## 2020-06-28 MED ORDER — AMLODIPINE BESYLATE 5 MG PO TABS
5.0000 mg | ORAL_TABLET | Freq: Every day | ORAL | Status: DC
  Administered 2020-06-28: 5 mg via ORAL
  Filled 2020-06-28: qty 1

## 2020-06-28 MED ORDER — POTASSIUM CHLORIDE CRYS ER 20 MEQ PO TBCR
40.0000 meq | EXTENDED_RELEASE_TABLET | Freq: Two times a day (BID) | ORAL | Status: DC
  Administered 2020-06-28 – 2020-06-29 (×3): 40 meq via ORAL
  Filled 2020-06-28 (×3): qty 2

## 2020-06-28 MED ORDER — POTASSIUM CHLORIDE 10 MEQ/100ML IV SOLN
10.0000 meq | INTRAVENOUS | Status: AC
Start: 2020-06-28 — End: 2020-06-28
  Administered 2020-06-28 (×4): 10 meq via INTRAVENOUS
  Filled 2020-06-28 (×3): qty 100

## 2020-06-28 MED ORDER — CARVEDILOL 6.25 MG PO TABS: 6.2500 mg | ORAL_TABLET | Freq: Two times a day (BID) | ORAL | Status: DC

## 2020-06-28 MED ORDER — POTASSIUM CHLORIDE CRYS ER 20 MEQ PO TBCR
40.0000 meq | EXTENDED_RELEASE_TABLET | Freq: Once | ORAL | Status: AC
  Administered 2020-06-28: 40 meq via ORAL
  Filled 2020-06-28: qty 2

## 2020-06-28 NOTE — Progress Notes (Signed)
K 2.8 Electrolytes replaced per protocol

## 2020-06-28 NOTE — Progress Notes (Signed)
Inpatient Rehabilitation Admissions Coordinator  I met at bedside with [name], daughter, and conference called with daughter, Eldridge Dace. I discussed goals and expectations of a possible CIR admit with goal to return home. I dicussed the need for initial supervision at home . Children are looking into all options for his rehab venues including other AIR venues locally. I will follow up tomorrow at 2 pm per their request.  Danne Baxter, RN, MSN Rehab Admissions Coordinator [phone] 06/28/2020 2:04 PM

## 2020-06-28 NOTE — Progress Notes (Signed)
Physical Therapy Treatment Patient Details Name: Mathew Malone MRN: [id number] DOB: [birth year] Today's Date: 06/28/2020    History of Present Illness Menashe Kafer is a 75 y.o. male walked downstairs and collapsed, incontinent, with left side not moving but then started moving LLE. 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  PMHx: HTN, PTSD, HOH    PT Comments    Pt admitted with above diagnosis. Pt was able to ambulate with RW with min assist with pt appearing to have some left inattention needing cues to steer away from objexts on his left.  Pt cued to scan environment to left with pt needing cues. Pt continues to be impulsive as well  With poor safety awareness at times. Daughter present and states that they want pt to have REhab at a [place] facility if possible. Will continue to follow pt. Pt currently with functional limitations due to balance and endurance deficits. Pt will benefit from skilled PT to increase their independence and safety with mobility to allow discharge to the venue listed below.     Follow Up Recommendations  CIR;Supervision - Intermittent     Equipment Recommendations   (TBA)    Recommendations for Other Services Rehab consult     Precautions / Restrictions Precautions Precautions: Fall Restrictions Weight Bearing Restrictions: No    Mobility  Bed Mobility Overal bed mobility: Needs Assistance Bed Mobility: Supine to Sit     Supine to sit: Supervision     General bed mobility comments: No assist to come EOB.    Transfers Overall transfer level: Needs assistance Equipment used: Rolling walker (2 wheeled) Transfers: Sit to/from [facility] Sit to Stand: Min assist         General transfer comment: Pt reports no dizziness and steadying assist to stand with cues for hand placement as well.  Ambulation/Gait Ambulation/Gait assistance: Min assist;+2 safety/equipment Gait Distance  (Feet): 200 Feet Assistive device: Rolling walker (2 wheeled) Gait Pattern/deviations: Step-through pattern;Decreased stride length;Drifts right/left   Gait velocity interpretation: <1.31 ft/sec, indicative of household ambulator General Gait Details: Pt able to ambulate in hallway.  Was running into objects on his left consistently needing cues.  Cued pt to scan environment increasing scan to left.  Question inattention to pts left side.  Pt not dizzy today during session. Followed with chair for safety.   Stairs             Wheelchair Mobility    Modified Rankin (Stroke Patients Only) Modified Rankin (Stroke Patients Only) Pre-Morbid Rankin Score: No significant disability Modified Rankin: Moderately severe disability     Balance Overall balance assessment: Needs assistance Sitting-balance support: No upper extremity supported;Feet supported Sitting balance-Leahy Scale: Fair Sitting balance - Comments: Pt can sit with min guard assist on EOB but needs close guard assist due to impulsivity.   Standing balance support: Bilateral upper extremity supported;During functional activity Standing balance-Leahy Scale: Poor Standing balance comment: Pt was able to stand with min assist for balance with UE support and RW.                            Cognition Arousal/Alertness: Awake/alert Behavior During Therapy: Impulsive Overall Cognitive Status: Impaired/Different from baseline Area of Impairment: Following commands;Safety/judgement;Awareness                       Following Commands: Follows one step commands consistently Safety/Judgement: Decreased awareness of  safety;Decreased awareness of deficits Awareness: Intellectual   General Comments: Pt still impulsive today - kept bringing LEs off bed prior to PT being ready for pt to get OOB.  Pt also soaked in urine but seemed unaware.      Exercises General Exercises - Lower Extremity Ankle Circles/Pumps:  AROM;Both;10 reps;Seated Long Arc Quad: AROM;Both;10 reps;Seated    General Comments General comments (skin integrity, edema, etc.): Did briefly check vestibular system which appears intact.  Pt on O2 on arrival but removed as sats >94% throughout session. RN in agrrement.      Pertinent Vitals/Pain Pain Assessment: No/denies pain    Home Living                      Prior Function            PT Goals (current goals can now be found in the care plan section) Acute Rehab PT Goals Patient Stated Goal: to return to [facility] Progress towards PT goals: Progressing toward goals    Frequency    Min 4X/week      PT Plan Current plan remains appropriate    Co-evaluation              AM-PAC PT "6 Clicks" Mobility   Outcome Measure  Help needed turning from your back to your side while in a flat bed without using bedrails?: None Help needed moving from lying on your back to sitting on the side of a flat bed without using bedrails?: None Help needed moving to and from a bed to a chair (including a wheelchair)?: A Little Help needed standing up from a chair using your arms (e.g., wheelchair or bedside chair)?: A Lot Help needed to walk in hospital room?: A Little Help needed climbing 3-5 steps with a railing? : Total 6 Click Score: 17    End of Session Equipment Utilized During Treatment: Gait belt Activity Tolerance: Patient tolerated treatment well Patient left: in chair;with call bell/phone within reach;with chair alarm set Nurse Communication: Mobility status PT Visit Diagnosis: Muscle weakness (generalized) (M62.81);Unsteadiness on feet (R26.81);Dizziness and giddiness (R42)     Time: 1106-1140 PT Time Calculation (min) (ACUTE ONLY): 34 min  Charges:  $Gait Training: 8-22 mins $Therapeutic Exercise: 8-22 mins                     Ardon Franklin M,PT Acute Rehab Services [phone] (pager)   Denice Paradise 06/28/2020, 2:13 PM

## 2020-06-28 NOTE — Progress Notes (Signed)
NAME:  Mathew Malone, MRN:  [id number], DOB:  1946-02-05, LOS: 3 ADMISSION DATE:  06/25/2020, CONSULTATION DATE:  06/28/20 REFERRING MD:  Amie Portland CHIEF COMPLAINT:  Bradycardia, need for sedation   Brief History:  75 year old male presented to ED via EMS after new onset of seizure with fall at home, residual L-sided weakness, R gaze deviation and aphasia. CT/MRI demonstrated R temporal intraparenchymal hemorrhage with c/f AVM (cerebral arteriogram negative). PCCM consulted for bradycardia/sedation management.  History of Present Illness:  Mathew Malone is a 75 year old male who presented to [facility] ED via EMS after he "collapsed" and fell at home with concern for seizure-like activity. At the time of the seizure, per daughter patient reportedly had left-sided weakness and slurred speech and was incontinent. He was brought to [facility] ED where CT and MRI Brain imaging demonstrated a R temporal intraparenchymal hemorrhage with concern for AVM. Patient subsequently underwent cerebral arteriogram with Neuro IR, which was grossly negative.   Patient was transferred to the ICU and Precedex was initiated for agitation; he subsequently became bradycardic to the 40s and PCCM was consulted for assistance with sedation management.  Past Medical History:   Past Medical History:  Diagnosis Date  . Arthritis    "fingers; knees; right shoulder" (04/28/2013)  . Cancer [facility]) 2008   prostate  . Exertional shortness of breath    "here lately" (04/28/2013)  . Gout   . HOH (hard of hearing)    bilateral  . Hyperlipidemia   . Hypertension    stress test- 2011  . OSA on CPAP   . Post traumatic stress disorder (PTSD)    Significant Hospital Events:  3/06 - Admitted 3/07 - PCCM consult for bradycardia in the setting of Precedex initiation 3/8 Mental slightly improved, bradycardic on Precedex so discontinued  Consults:  PCCM  Procedures:    Significant Diagnostic Tests:   3/6 CT Head Code Stroke >> 2.9 x 2.7 x 1.3  cm acute intraparenchymal hemorrhage centered at the posterior R temporal lobe (estimated volume 5 cc), mild localized edema without significant regional mass effect, underlying moderate to advanced chronic microvascular ischemic disease.  3/6 CT Angio Head/Neck >> no significant interval change in size/morphology of estimated 5cc intraparenchymal hemorrhage centered at the posterior R temporal lobe, mildly increased localized vasogenic edema without significant regional mass effect, no other new acute intracranial abnormality; 51mm blush of contrast at posterior R temporal lobe favored to reflect a focal venous confluence, mild atherosclerotic change at carotid bifurcations, otherwise negative - no large vessel occlusion or hemodynamically significant stenosis  3/7 MRI Brain w/ w/o contrast >> no significant interval change in the size of the posterior R temporal lobe intraparenchymal hematoma measuring 2.9 x 2.6 x 1.4cm with mild surrounding vasogenic edema, no focus of abnormal contrast enhancement identified; prominent vessels in the superior aspect of the hematoma, cannot exclude micro AVM  3/7 Cerebral Arteriogram (4 Vessel) >> Marred by motion artifact, grossly no evidence of aneurysm, AV shunting, DAVF, extracranial/intracranial dissections or of occlusions/stenosis; venous outflow intact  3/7 MRI Brain>>No significant interval change in size of the posterior right temporal lobe intraparenchymal hematoma measuring approximately 2.9 x 2.6 x 1.4 cm with mild surrounding vasogenic edema. No focus of abnormal contrast enhancement identified.  Micro Data:  3/6 COVID negative 3/6 MRSA PCR negative  Antimicrobials:    Interim History / Subjective:   Slightly improved mental status today, Precedex causing bradycardia so discontinued.  No further signs of seizure activity, remains on Cleviprex  Objective  Blood pressure (!) 148/56, pulse (!) 49, temperature 98.9 F (37.2 C), temperature  source Oral, resp. rate (!) 29, height 5\' 10"  (1.778 m), weight 98.1 kg, SpO2 94 %.        Intake/Output Summary (Last 24 hours) at 06/28/2020 0858 Last data filed at 06/28/2020 0700 Gross per 24 hour  Intake 1652.74 ml  Output 2125 ml  Net -472.26 ml   Filed Weights   06/25/20 0300  Weight: 98.1 kg    General: Elderly male, awake, calm, no distress HEENT: MM pink/moist Neuro: Awake, alert, more oriented today, pleasant and following commands, moving all extremities without extremity weakness or facial droop  CV: s1s2 bradycardic, no m/r/g PULM: On room air, no respiratory distress, no rhonchi or wheezing GI: soft, bsx4 active  Extremities: warm/dry, no edema  Skin: no rashes or lesions      Resolved Hospital Problem list     Assessment & Plan:    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 CT/MRI Brain demonstrated R temporal ICH (as above). S/p Neuro IR cerebral arteriogram which was grossly normal without AVM noted P: -Neurology primary, MRI without further extension or obvious AVM -No further seizure activity, EEG discontinued, continue Keppra and Ativan as needed -Pursue normoglycemia -Passed swallow screen, start clear liquid diet -Continue Cleviprex, home amlodipine and beta blovcker started today, wean as able.  Goal SBP<160 -Echo with EF 50 to 55% and moderate biatrial dilation    Bradycardia likely in the setting of sedation Bradycardia into the 40s noted on initiation of Precedex for agitation. PCCM consulted for management of sedation. P: -bradycardia improved off Precedex, echo ok  -Home Abilify and Zoloft resumed at decreased doses -Continue telemetry   Hypertension History of hypertension with SBPs reportedly > 200 on EMS arrival. Home regimen includes Coreg, Lisinopril, HCTZ. P: -Continue Cleviprex, started  on home Norvasc and Lisinopril, goal SBP less than 160  Hypokalemia - Supplement PRN for K > 4 -Magnesium  History of PTSD -Continue home Abilify and Zoloft with Klonopin as needed  Best practice (evaluated daily)  Diet: Clear liquid Pain/Anxiety/Delirium protocol (if indicated): Klonopin VAP protocol (if indicated): N/A DVT prophylaxis: Per Neuro, hold in the setting of active ICH GI prophylaxis: PPI Glucose control: SSI Mobility: Bedrest Disposition: ICU Family communication: Per primary  Goals of Care:  Last date of multidisciplinary goals of care discussion: Per primary team Family and staff present:  Summary of discussion:  Follow up goals of care discussion due: 3/14 Code Status: Full  Labs   CBC: Recent Labs  Lab 06/25/20 0340 06/25/20 0350 06/26/20 0217 06/27/20 0344 06/28/20 0020  WBC  --  14.1* 9.3 7.6 7.9  NEUTROABS  --  10.3*  --   --   --   HGB 18.0* 17.0 15.5 14.6 14.8  HCT 53.0* 52.9* 44.5 41.6 43.2  MCV  --  89.5 84.1 84.7 84.9  PLT  --  145* 102* 95* 92*    Basic Metabolic Panel: Recent Labs  Lab 06/25/20 0340 06/25/20 0350 06/25/20 0800 06/26/20 0217 06/27/20 0344 06/27/20 1012 06/28/20 0020  NA 139 140  --  139 139  --  135  K 3.1* 3.0*  --  3.1* 3.0*  --  2.8*  CL 100 98  --  103 105  --  101  CO2  --  24  --  24 25  --  25  GLUCOSE 201* 222*  --  140* 119*  --  114*  BUN 22 15  --  16 15  --  7*  CREATININE 1.00 1.12  --  0.99 0.84  --  0.74  CALCIUM  --  9.7  --  9.4 9.2  --  8.8*  MG  --   --  1.9  --   --  1.9  --   PHOS  --   --  2.5  --   --   --   --    GFR: Estimated Creatinine Clearance: 98 mL/min (by C-G formula based on SCr of 0.74 mg/dL). Recent Labs  Lab 06/25/20 0350 06/26/20 0217 06/27/20 0344 06/28/20 0020  WBC 14.1* 9.3 7.6 7.9    Liver Function Tests: Recent Labs  Lab 06/25/20 0350  AST 35  ALT 23  ALKPHOS 51  BILITOT 1.1  PROT 7.5  ALBUMIN 4.7   No results for input(s): LIPASE, AMYLASE in the  last 168 hours. No results for input(s): AMMONIA in the last 168 hours.  ABG    Component Value Date/Time   HCO3 29.3 (H) 05/06/2012 1508   TCO2 27 06/25/2020 0340   O2SAT 45.0 05/06/2012 1508     Coagulation Profile: Recent Labs  Lab 06/25/20 0350  INR 1.0    Cardiac Enzymes: No results for input(s): CKTOTAL, CKMB, CKMBINDEX, TROPONINI in the last 168 hours.  HbA1C: Hgb A1c MFr Bld  Date/Time Value Ref Range Status  06/25/2020 08:00 AM 5.6 4.8 - 5.6 % Final    Comment:    (NOTE) Pre diabetes:          5.7%-6.4%  Diabetes:              >6.4%  Glycemic control for   <7.0% adults with diabetes     CBG: Recent Labs  Lab 06/27/20 1548 06/27/20 1929 06/27/20 2302 06/28/20 0344 06/28/20 0841  GLUCAP 119* 142* 122* 114* 124*    Review of Systems:   Patient is encephalopathic and/or intubated. Therefore history has been obtained from chart review.   Past Medical History:  He,  has a past medical history of Arthritis, Cancer ([place]) (2008), Exertional shortness of breath, Gout, HOH (hard of hearing), Hyperlipidemia, Hypertension, OSA on CPAP, and Post traumatic stress disorder (PTSD).   Surgical History:   Past Surgical History:  Procedure Laterality Date  . INGUINAL HERNIA REPAIR Right 1970's  . IR ANGIO INTRA EXTRACRAN SEL COM CAROTID INNOMINATE BILAT MOD SED  06/26/2020  . IR ANGIO VERTEBRAL SEL SUBCLAVIAN INNOMINATE BILAT MOD SED  06/26/2020  . JOINT REPLACEMENT    . LEFT HEART CATHETERIZATION WITH CORONARY ANGIOGRAM N/A 04/29/2013   Procedure: LEFT HEART CATHETERIZATION WITH CORONARY ANGIOGRAM;  Surgeon: Laverda Page, MD;  Location: [facility] CATH LAB;  Service: Cardiovascular;  Laterality: N/A;  . PENILE PROSTHESIS IMPLANT  04/05/2011   Procedure: PENILE PROTHESIS INFLATABLE;  Surgeon: Claybon Jabs, MD;  Location: [facility];  Service: Urology;  Laterality: N/A;   EXPLORATION AND REVISION OF INFLATABLE PENILE   PROSTHESIS    PUMP  . PENILE  PROSTHESIS IMPLANT  10/21/2011   Procedure: PENILE PROTHESIS INFLATABLE;  Surgeon: Claybon Jabs, MD;  Location[facility];  Service: Urology;  Laterality: N/A;     . PENILE PROSTHESIS PLACEMENT  12/2010  . ROBOT ASSISTED LAPAROSCOPIC RADICAL PROSTATECTOMY  2008  . SHOULDER OPEN ROTATOR CUFF REPAIR Bilateral 2007-2008   "twice on each side"  .  TONSILLECTOMY    . TOTAL KNEE ARTHROPLASTY Bilateral 2011-2012   lt knee-2012  / rt knee -2011     Social History:   reports that he quit smoking about 49 years ago. His smoking use included cigarettes. He has a 0.48 pack-year smoking history. He has never used smokeless tobacco. He reports current alcohol use of about 1.0 standard drink of alcohol per week. He reports current drug use. Drug: Marijuana.   Family History:  His family history is not on file.   Allergies No Known Allergies   Home Medications  Prior to Admission medications   Medication Sig Start Date End Date Taking? Authorizing Provider  acetaminophen (TYLENOL) 500 MG tablet Take 1,000 mg by mouth every 6 (six) hours as needed for mild pain.   Yes [provider]  allopurinol (ZYLOPRIM) 100 MG tablet Take 200 mg by mouth daily.   Yes [provider]  ascorbic acid (VITAMIN C) 500 MG tablet Take 500 mg by mouth 2 (two) times daily.   Yes [provider]  aspirin 81 MG chewable tablet Chew 162 mg by mouth at bedtime.   Yes [provider]  calcium-vitamin D (OSCAL WITH D) 500-200 MG-UNIT per tablet Take 1 tablet by mouth daily. 04/29/13  Yes Adrian Prows, MD  [place] 450 MG CAPS Take 450 mg by mouth daily.   Yes [provider]  cetirizine (ZYRTEC) 10 MG tablet Take 10 mg by mouth daily.   Yes [provider]  DHA-Vitamin C-Lutein (EYE HEALTH FORMULA PO) Take 470 mg by mouth daily. Eye bright tab   Yes [provider]  gabapentin (NEURONTIN) 600 MG tablet Take 1,200 mg by mouth 3 (three) times daily.    Yes [provider]  galantamine (RAZADYNE ER) 8 MG 24 hr capsule Take 8 mg by mouth daily with breakfast.   Yes [provider]  hydrochlorothiazide (HYDRODIURIL) 25 MG tablet Take 12.5 mg by mouth daily. 05/22/12 12/06/20 Yes [provider]  ibuprofen (ADVIL) 800 MG tablet Take 800 mg by mouth every 8 (eight) hours as needed. 06/06/20  Yes [provider]  Iron-Vitamins (GERITOL) LIQD Take 1 Dose by mouth daily.   Yes [provider]  lisinopril (ZESTRIL) 40 MG tablet Take 40 mg by mouth daily.   Yes [provider]  mirabegron ER (MYRBETRIQ) 50 MG TB24 tablet Take 50 mg by mouth daily.   Yes [provider]  Misc Natural Products (TOTAL MEMORY & FOCUS FORMULA PO) Take 1 tablet by mouth daily.   Yes [provider]  OVER THE COUNTER MEDICATION Take 1 tablet by mouth daily. [place] energizer   Yes [provider]  PEG-KCl-NaCl-NaSulf-Na Asc-C (PLENVU) 140 g SOLR Take 140 g by mouth See admin instructions. As directed for bowel prep 05/25/20  Yes [provider]  polyvinyl alcohol (LIQUIFILM TEARS) 1.4 % ophthalmic solution Place into both eyes 2 (two) times daily as needed for dry eyes.   Yes [provider]  sertraline (ZOLOFT) 100 MG tablet Take 1 tablet (100 mg total) by mouth 2 (two) times daily. Patient taking differently: Take 100 mg by mouth daily. 04/29/13  Yes Adrian Prows, MD  simvastatin (ZOCOR) 40 MG tablet Take 20 mg by mouth daily.   Yes [provider]  ARIPiprazole (ABILIFY) 30 MG tablet Take 30 mg by mouth daily.    [provider]  carvedilol (COREG) 6.25 MG tablet Take 1 tablet (6.25 mg total) by mouth 2 (two) times  daily with a meal. Patient not taking: No sig reported 04/29/13   Adrian Prows, MD  HYDROcodone-acetaminophen (VICODIN) 5-500 MG per tablet Take 1 tablet by mouth every 6 (six) hours as needed. For pain Patient not taking: No sig reported 04/29/13   Adrian Prows, MD   Phentermine-Topiramate (QSYMIA) 7.5-46 MG CP24 Take 1 capsule by mouth daily. Take 1 capsule by mouth daily start day 15 after 14 day course. Patient not taking: No sig reported 04/29/13   Adrian Prows, MD     Critical care time: 30 minutes   Otilio Carpen Gleason, PA-C [facility] See Amion for pager If no response to pager , please call [phone] until 7pm After 7:00 pm call Elink  [phone]

## 2020-06-28 NOTE — Progress Notes (Addendum)
STROKE TEAM PROGRESS NOTE   INTERVAL HISTORY Patient was seen and examined this morning.  No family at bedside. Overnight, agitation was better-did not require sedation. Blood pressure has been ranging from 110s to 160s, running Cleviprex at 4.    Vitals:   06/28/20 0500 06/28/20 0545 06/28/20 0600 06/28/20 0603  BP: (!) 148/82 (!) 118/57 (!) 164/101 (!) 156/73  Pulse: (!) 38 68 60 (!) 48  Resp: (!) 38 (!) 30 (!) 23 (!) 31  Temp:      TempSrc:      SpO2: 97% 96% 98% 98%  Weight:      Height:       CBC:  Recent Labs  Lab 06/25/20 0350 06/26/20 0217 06/27/20 0344 06/28/20 0020  WBC 14.1*   < > 7.6 7.9  NEUTROABS 10.3*  --   --   --   HGB 17.0   < > 14.6 14.8  HCT 52.9*   < > 41.6 43.2  MCV 89.5   < > 84.7 84.9  PLT 145*   < > 95* 92*   < > = values in this interval not displayed.   Basic Metabolic Panel:  Recent Labs  Lab 06/25/20 0800 06/26/20 0217 06/27/20 0344 06/27/20 1012 06/28/20 0020  NA  --    < > 139  --  135  K  --    < > 3.0*  --  2.8*  CL  --    < > 105  --  101  CO2  --    < > 25  --  25  GLUCOSE  --    < > 119*  --  114*  BUN  --    < > 15  --  7*  CREATININE  --    < > 0.84  --  0.74  CALCIUM  --    < > 9.2  --  8.8*  MG 1.9  --   --  1.9  --   PHOS 2.5  --   --   --   --    < > = values in this interval not displayed.   Lipid Panel:  Recent Labs  Lab 06/25/20 0800  CHOL 141  TRIG 41  HDL 57  CHOLHDL 2.5  VLDL 8  LDLCALC 76   HgbA1c:  Recent Labs  Lab 06/25/20 0800  HGBA1C 5.6   Urine Drug Screen:  Recent Labs  Lab 06/25/20 1409  LABOPIA NONE DETECTED  COCAINSCRNUR NONE DETECTED  LABBENZ NONE DETECTED  AMPHETMU NONE DETECTED  THCU POSITIVE*  LABBARB NONE DETECTED    Alcohol Level  Recent Labs  Lab 06/25/20 0800  ETH <10    IMAGING CT head from 06/27/2020 Stable 2.8 cm intraparenchymal hematoma within the posterior right temporal lobe with surrounding mild mass effect. No midline shift. No interval hemorrhage.  Ventricular size is normal.  MRI brain with and without contrast03/10/2020 IMPRESSION: 1. No significant interval change in size of the posterior right temporal lobe intraparenchymal hematoma measuring approximately 2.9 x 2.6 x 1.4 cm with mild surrounding vasogenic edema. No focus of abnormal contrast enhancement identified. 2. Prominent vessels in the superior aspect of the hematoma may represent displaced leptomeningeal vessels although the possibility of a micro AVM cannot be entirely excluded.  EEG adult Result Date: 06/26/2020 IMPRESSION: This study showed evidence of potential epileptogenicity arising from right posterior temporal region as well as cortical dysfunction in the right temporal region likely secondary to underlying bleed. No seizures  were seen throughout the recording. Mathew Malone   PHYSICAL EXAM Vitals:   06/28/20 0600 06/28/20 0603  BP: (!) 164/101 (!) 156/73  Pulse: 60 (!) 48  Resp: (!) 23 (!) 31  Temp:    SpO2: 98% 98%    General: Off sedation, comfortable in bed, not intubated HEENT:  normocephalic, left lateral tongue laceration from bite CVS: Bradycardic Extremities warm well perfused Neurological exam Awake, alert, oriented to person, place, month, year No gross dysarthria No aphasia Cranial nerves: Pupils equal round react to light, extraocular movements intact, visual fields full, face appears symmetric, facial sensation intact, tongue and palate midline. Motor exam: Symmetric 5/5 in all fours without drift. Sensation intact light touch all over FNF intact Gait testing deferred at this time    Current Facility-Administered Medications:  .   stroke: mapping our early stages of recovery book, , Does not apply, Once, Gwinda Maine, MD .  0.9 %  sodium chloride infusion, , Intravenous, Continuous, Rosalin Hawking, MD, Last Rate: 50 mL/hr at 06/28/20 0600, Infusion Verify at 06/28/20 0600 .  0.9 %  sodium chloride infusion, , Intravenous,  Continuous, Deveshwar, Sanjeev, MD, Last Rate: 50 mL/hr at 06/27/20 1700, Infusion Verify at 06/27/20 1700 .  acetaminophen (TYLENOL) tablet 650 mg, 650 mg, Oral, Q4H PRN **OR** acetaminophen (TYLENOL) 160 MG/5ML solution 650 mg, 650 mg, Per Tube, Q4H PRN **OR** acetaminophen (TYLENOL) suppository 650 mg, 650 mg, Rectal, Q4H PRN, Gwinda Maine, MD .  ARIPiprazole (ABILIFY) tablet 10 mg, 10 mg, Oral, Daily, Amie Portland, MD, 10 mg at 06/27/20 1032 .  carvedilol (COREG) tablet 6.25 mg, 6.25 mg, Oral, BID WC, Amie Portland, MD .  Chlorhexidine Gluconate Cloth 2 % PADS 6 each, 6 each, Topical, Daily, Rosalin Hawking, MD, 6 each at 06/27/20 1135 .  clevidipine (CLEVIPREX) infusion 0.5 mg/mL, 0-21 mg/hr, Intravenous, Continuous, Amie Portland, MD, Last Rate: 8 mL/hr at 06/28/20 0605, 4 mg/hr at 06/28/20 0605 .  clonazePAM (KLONOPIN) tablet 0.5 mg, 0.5 mg, Oral, BID, Gleason, Otilio Carpen, PA-C, 0.5 mg at 06/27/20 2145 .  dexmedetomidine (PRECEDEX) 400 MCG/100ML (4 mcg/mL) infusion, 0.2-0.4 mcg/kg/hr, Intravenous, Titrated, Corey Harold, NP, Paused at 06/27/20 [phone] .  hydrochlorothiazide (MICROZIDE) capsule 12.5 mg, 12.5 mg, Oral, Daily, Amie Portland, MD .  levETIRAcetam (KEPPRA) tablet 1,500 mg, 1,500 mg, Oral, BID, Olivencia-Simmons, Ivelisse, NP, 1,500 mg at 06/27/20 2145 .  lisinopril (ZESTRIL) tablet 40 mg, 40 mg, Oral, Daily, Olivencia-Simmons, Ivelisse, NP, 40 mg at 06/27/20 1123 .  LORazepam (ATIVAN) injection 1 mg, 1 mg, Intravenous, Q15 min PRN, Metzger-Cihelka, Desiree, NP .  pantoprazole (PROTONIX) injection 40 mg, 40 mg, Intravenous, QHS, Collins, Unk Pinto, MD, 40 mg at 06/27/20 2145 .  potassium chloride 10 mEq in 100 mL IVPB, 10 mEq, Intravenous, Q1 Hr x 4, Amie Portland, MD, Last Rate: 100 mL/hr at 06/28/20 0734, 10 mEq at 06/28/20 0734 .  potassium chloride SA (KLOR-CON) CR tablet 40 mEq, 40 mEq, Oral, BID, Amie Portland, MD .  senna-docusate (Senokot-S) tablet 1 tablet, 1 tablet, Oral, BID,  Gwinda Maine, MD, 1 tablet at 06/27/20 2145 .  sertraline (ZOLOFT) tablet 50 mg, 50 mg, Oral, BID, Amie Portland, MD, 50 mg at 06/27/20 2053 .  sodium chloride flush (NS) 0.9 % injection 3 mL, 3 mL, Intravenous, Once, Pollina, Gwenyth Allegra, MD    ASSESSMENT/PLAN [name] is a 75 y.o. male with history of HTN, HLD presenting with new onset seizures and AMS. 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   MRI brain reviewed: Does not have the typical appearance of cerebral amyloid angiopathy. There was concern for micro-AVM but diagnostic cerebral angiogram done by Dr. Estanislado Pandy reports no AVM aneurysm or dural AV fistula.  ICH has been stable on multiple CTs and MRIs done throughout the course of this hospitalization for now.    ICH: right temporal lobe ICH, etiology likely hypertensive,   CT head right temporal lobe ICH about 5cc  CTA head & neck stable hematoma, 7 mm blush of contrast at the posterior right temporal lobe, immediately adjacent to the right temporal lobe hemorrhage  Cerebral angiogram done (06/26/2020): no AVM aneurysm or dural AV fistula reported-CT angio rules out AVM.  Repeat CT 06/27/2020 with stable 2.8 cm IPH within the posterior right temporal lobe with surrounding mild mass-effect.  No midline shift.  LDL 76  HgbA1c 5.6  VTE prophylaxis - SCDs  2D echo: EF 50-55%, moderate biatrial dilation  ASA 81mg  prior to admission, now no AP/AC d/t bleed  Therapy recommendations:  OT recs CIR,   Disposition:  CIR or home depending on how he does over the next few days.  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 D/C EEG leads 03/08/202  Bradycardia resolved with discontinuation of Precedex.  Hypertension  Home meds:  Maxzide, Coreg, Zestril, Hydrodiuril-unclear compliance-suspect non-compliance to meds. [place] records not available. . Wean off Cleviprex. . On lisinopril 40.  Added amlodipine 5mg  daily as well as HCTZ 12.5 daily. . Change SBP goal to <160 . Long-term BP goal normotensive  Sinus Bradycardia  Worsened while on high dose precedex and better after discontinuation  Continue to monitor telemetry  Appears asymptomatic at this time  Hyperlipidemia  Home meds:  Lovaza, Q10  LDL 76, goal < 70  Consider statin at discharge  Dysphagia   Passed bedside swallow eval  Speech on board  PTSD, history  Sertraline 50 mg bid  Ability 10 mg daily  Clonazepam 0.5mg  bid prn  Hypokalemia  K 2.8 replaced with IV and p.o. supplementation  Thrombocytopenia  Platelet count 125 prior admission  Platelet count 145->102->95 ->92  Repeat level in am and continue to monitor.  No evidence of active bleeding.  No indication for transfusion at this time.  Other Stroke Risk Factors  Advanced Age >/= 65   OSA on CPAP at home  Previous Cigarette smoker  Obesity, Body mass index is 31.03 kg/m., BMI >/= 30 associated with increased stroke risk, recommend weight loss, diet and exercise as appropriate   Other Active Problems  PTSD- high risk for delirium. Should resume mood/SSRi meds when able  Prostate Cancer-MRI brain negative for any underlying metastatic lesion at this time.  Chronic urinary retention- resume oxybutynin when able  Grief: Recently lost his wife. He has been having difficulty managing alone. He is visiting his two daughters that live in [place] from [place], [place] [facility]-orders placed in the chart-pending chart availability.  ConsultS:  PCCM-appreciate consultation.  Hospital day # 3  Please feel free to call with any questions.  Discussed with Mickel Baas Gleason,  NP, PCCM  -- Amie Portland, MD Stroke Neurology Pager: [phone]  CRITICAL CARE ATTESTATION Performed by: Amie Portland, MD Total critical care time: 33 minutes Critical care time was exclusive of separately billable procedures and treating other patients and/or supervising APPs/Residents/Students Critical care was necessary to treat or prevent imminent or life-threatening deterioration due to Short This patient is critically ill and at significant risk for neurological worsening and/or death and care requires constant monitoring. Critical care was time spent personally by me on the following activities: development of treatment plan with patient and/or surrogate as well as nursing, discussions with consultants, evaluation of patient's response to treatment, examination of patient, obtaining history from patient or surrogate, ordering and performing treatments and interventions, ordering and review of laboratory studies, ordering and review of radiographic studies, pulse oximetry, re-evaluation of patient's condition, participation in multidisciplinary rounds and medical decision making of high complexity in the care of this patient.

## 2020-06-28 NOTE — TOC Initial Note (Addendum)
Transition of Care [facility]) - Initial/Assessment Note    Patient Details  Name: Mathew Malone MRN: [id number] Date of Birth: 1946-03-03  Transition of [facility] CM/SW Contact:    Mathew Crews, RN Phone Number: [phone] 06/28/2020, 1:14 PM  Clinical Narrative:                  Spoke with patient and daughter, Mathew Malone, at the bedside. PTA home alone - recently widowed in May 2021. Had been living in [place], [place], but will be moving back to [place] to be closer to family. Previously independent with ADL/Iadls, and driving. No DME. Managed his own medications and medical appointments. Patient stated that there were times that he would miss doses d/t forgetting. Discussed options for medication alarms and pill boxes.   Family with interest in patient receiving post acute services in an inpatient rehab facility. Family asking for list of inpatient rehab facilities to consider. Referrals sent out - CIR for follow up; [facility]; [facility]; and [facility].   TOC following for transition needs.   Expected Discharge Plan: IP Rehab Facility Barriers to Discharge: Continued Medical Work up   Patient Goals and CMS Choice Patient states their goals for this hospitalization and ongoing recovery are:: get better and go home CMS Medicare.gov Compare Post Acute Care list provided to:: Patient Choice offered to / list presented to : [place]  Expected Discharge Plan and Services Expected Discharge Plan: [place] In-house Referral: Clinical Social Work Discharge Planning Services: CM Consult Post Acute Care Choice: IP Rehab Living arrangements for the past 2 months: Single Family Home                 DME Arranged: N/A DME Agency: NA       HH Arranged: NA [place] Agency: NA        Prior Living Arrangements/Services Living arrangements for the past 2 months: [place] Lives with:: Self Patient language and need for interpreter reviewed:: Yes Do you feel safe going  back to the place where you live?: Yes      Need for Family Participation in Patient Care: Yes (Comment)     Criminal Activity/Legal Involvement Pertinent to Current Situation/Hospitalization: No - Comment as needed  Activities of Daily Living      Permission Sought/Granted Permission sought to share information with : Family Supports Permission granted to share information with : Yes, Verbal Permission Granted  Share Information with NAME: Mathew Malone     Permission granted to share info w Relationship: daughter  Permission granted to share info w Contact Information: [phone]  Emotional Assessment Appearance:: Appears stated age Attitude/Demeanor/Rapport: Engaged Affect (typically observed): Accepting Orientation: : Oriented to Self,Oriented to  Time,Oriented to Place,Oriented to Situation Alcohol / Substance Use: Not Applicable Psych Involvement: No (comment)  Admission diagnosis:  Stroke, hemorrhagic ([place]) [I61.9] Intraparenchymal hemorrhage of brain [facility]) [I61.9] Patient Active Problem List   Diagnosis Date Noted  . 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   PCP:  Mathew Solian, MD Pharmacy:   [facility], [place], [place], [place] [address] [place] [address] Phone: [phone] Fax: [phone]  [facility], [place] [address] Phone: [phone] Fax: [id number] - [place], [place]  [place] [address] Phone: [phone] Fax: [phone]  [place] [address] Phone: [phone] Fax: [phone]     Social Determinants of  Health (SDOH) Interventions    Readmission Risk Interventions No flowsheet data found.

## 2020-06-29 DIAGNOSIS — E876 Hypokalemia: Secondary | ICD-10-CM

## 2020-06-29 DIAGNOSIS — I619 Nontraumatic intracerebral hemorrhage, unspecified: Secondary | ICD-10-CM | POA: Diagnosis not present

## 2020-06-29 DIAGNOSIS — I161 Hypertensive emergency: Secondary | ICD-10-CM | POA: Diagnosis not present

## 2020-06-29 LAB — CBC
HCT: 44.5 % (ref 39.0–52.0)
Hemoglobin: 15.6 g/dL (ref 13.0–17.0)
MCH: 29.3 pg (ref 26.0–34.0)
MCHC: 35.1 g/dL (ref 30.0–36.0)
MCV: 83.6 fL (ref 80.0–100.0)
Platelets: 98 10*3/uL — ABNORMAL LOW (ref 150–400)
RBC: 5.32 MIL/uL (ref 4.22–5.81)
RDW: 13.1 % (ref 11.5–15.5)
WBC: 8 10*3/uL (ref 4.0–10.5)
nRBC: 0 % (ref 0.0–0.2)

## 2020-06-29 LAB — BASIC METABOLIC PANEL
Anion gap: 8 (ref 5–15)
BUN: 5 mg/dL — ABNORMAL LOW (ref 8–23)
CO2: 28 mmol/L (ref 22–32)
Calcium: 9.7 mg/dL (ref 8.9–10.3)
Chloride: 99 mmol/L (ref 98–111)
Creatinine, Ser: 0.74 mg/dL (ref 0.61–1.24)
GFR, Estimated: >60 mL/min (ref >60)
Glucose, Bld: 125 mg/dL — ABNORMAL HIGH (ref 70–99)
Potassium: 3.1 mmol/L — ABNORMAL LOW (ref 3.5–5.1)
Sodium: 135 mmol/L (ref 135–145)

## 2020-06-29 LAB — GLUCOSE, CAPILLARY
Glucose-Capillary: 141 mg/dL — ABNORMAL HIGH (ref 70–99)
Glucose-Capillary: 119 mg/dL — ABNORMAL HIGH (ref 70–99)
Glucose-Capillary: 123 mg/dL — ABNORMAL HIGH (ref 70–99)
Glucose-Capillary: 130 mg/dL — ABNORMAL HIGH (ref 70–99)
Glucose-Capillary: 121 mg/dL — ABNORMAL HIGH (ref 70–99)

## 2020-06-29 LAB — POTASSIUM: Potassium: 4 mmol/L (ref 3.5–5.1)

## 2020-06-29 MED ORDER — AMLODIPINE BESYLATE 10 MG PO TABS
10.0000 mg | ORAL_TABLET | Freq: Every day | ORAL | Status: DC
  Administered 2020-06-29 – 2020-07-01 (×3): 10 mg via ORAL
  Filled 2020-06-29 (×3): qty 1

## 2020-06-29 MED ORDER — HYDRALAZINE HCL 50 MG PO TABS
50.0000 mg | ORAL_TABLET | Freq: Three times a day (TID) | ORAL | Status: DC
  Administered 2020-06-29 – 2020-06-30 (×2): 50 mg via ORAL
  Filled 2020-06-29 (×2): qty 1

## 2020-06-29 MED ORDER — MAGNESIUM SULFATE 4 GM/100ML IV SOLN
4.0000 g | Freq: Once | INTRAVENOUS | Status: AC
  Administered 2020-06-29: 4 g via INTRAVENOUS
  Filled 2020-06-29: qty 100

## 2020-06-29 MED ORDER — HYDRALAZINE HCL 20 MG/ML IJ SOLN
10.0000 mg | INTRAMUSCULAR | Status: DC | PRN
  Administered 2020-06-29: 10 mg via INTRAVENOUS
  Filled 2020-06-29: qty 1

## 2020-06-29 MED ORDER — CARVEDILOL 3.125 MG PO TABS
3.1250 mg | ORAL_TABLET | Freq: Two times a day (BID) | ORAL | Status: DC
  Administered 2020-06-29 – 2020-07-01 (×5): 3.125 mg via ORAL
  Filled 2020-06-29 (×5): qty 1

## 2020-06-29 MED ORDER — POTASSIUM CHLORIDE 10 MEQ/100ML IV SOLN
10.0000 meq | INTRAVENOUS | Status: DC
Start: 2020-06-29 — End: 2020-06-29
  Administered 2020-06-29: 10 meq via INTRAVENOUS
  Filled 2020-06-29: qty 100

## 2020-06-29 MED ORDER — ISOSORBIDE MONONITRATE ER 30 MG PO TB24
30.0000 mg | ORAL_TABLET | Freq: Every day | ORAL | Status: DC
  Administered 2020-06-29 – 2020-07-01 (×3): 30 mg via ORAL
  Filled 2020-06-29 (×3): qty 1

## 2020-06-29 MED ORDER — CLONAZEPAM 0.5 MG PO TABS: 0.5000 mg | ORAL_TABLET | Freq: Two times a day (BID) | ORAL | Status: DC | PRN

## 2020-06-29 MED ORDER — POTASSIUM CHLORIDE CRYS ER 20 MEQ PO TBCR
40.0000 meq | EXTENDED_RELEASE_TABLET | Freq: Three times a day (TID) | ORAL | Status: DC
  Administered 2020-06-29 (×2): 40 meq via ORAL
  Filled 2020-06-29 (×2): qty 2

## 2020-06-29 NOTE — Progress Notes (Signed)
NAME:  Mathew Malone, MRN:  [id number], DOB:  December 23, 1945, LOS: 4 ADMISSION DATE:  06/25/2020, CONSULTATION DATE:  06/29/20 REFERRING MD:  Amie Portland CHIEF COMPLAINT:  Bradycardia, need for sedation   Brief History:  75 year old male presented to ED via EMS after new onset of seizure with fall at home, residual L-sided weakness, R gaze deviation and aphasia. CT/MRI demonstrated R temporal intraparenchymal hemorrhage with c/f AVM (cerebral arteriogram negative). PCCM consulted for bradycardia/sedation management.  History of Present Illness:  Mathew Malone is a 75 year old male who presented to [facility] ED via EMS after he "collapsed" and fell at home with concern for seizure-like activity. At the time of the seizure, per daughter patient reportedly had left-sided weakness and slurred speech and was incontinent. He was brought to [facility] ED where CT and MRI Brain imaging demonstrated a R temporal intraparenchymal hemorrhage with concern for AVM. Patient subsequently underwent cerebral arteriogram with Neuro IR, which was grossly negative.   Patient was transferred to the ICU and Precedex was initiated for agitation; he subsequently became bradycardic to the 40s and PCCM was consulted for assistance with sedation management.  Past Medical History:   Past Medical History:  Diagnosis Date  . Arthritis    "fingers; knees; right shoulder" (04/28/2013)  . Cancer [facility]) 2008   prostate  . Exertional shortness of breath    "here lately" (04/28/2013)  . Gout   . HOH (hard of hearing)    bilateral  . Hyperlipidemia   . Hypertension    stress test- 2011  . OSA on CPAP   . Post traumatic stress disorder (PTSD)    Significant Hospital Events:  3/06 - Admitted 3/07 - PCCM consult for bradycardia in the setting of Precedex initiation 3/8 Mental slightly improved, bradycardic on Precedex so discontinued 3/9 Slightly improved mental status today, Precedex causing bradycardia so discontinued.  No further signs of  seizure activity, remains on Cleviprex  Consults:  PCCM Neuro IR  Procedures:    Significant Diagnostic Tests:   3/6 CT Head Code Stroke >> 2.9 x 2.7 x 1.3 cm acute intraparenchymal hemorrhage centered at the posterior R temporal lobe (estimated volume 5 cc), mild localized edema without significant regional mass effect, underlying moderate to advanced chronic microvascular ischemic disease.  3/6 CT Angio Head/Neck >> no significant interval change in size/morphology of estimated 5cc intraparenchymal hemorrhage centered at the posterior R temporal lobe, mildly increased localized vasogenic edema without significant regional mass effect, no other new acute intracranial abnormality; 23mm blush of contrast at posterior R temporal lobe favored to reflect a focal venous confluence, mild atherosclerotic change at carotid bifurcations, otherwise negative - no large vessel occlusion or hemodynamically significant stenosis  3/7 MRI Brain w/ w/o contrast >> no significant interval change in the size of the posterior R temporal lobe intraparenchymal hematoma measuring 2.9 x 2.6 x 1.4cm with mild surrounding vasogenic edema, no focus of abnormal contrast enhancement identified; prominent vessels in the superior aspect of the hematoma, cannot exclude micro AVM  3/7 Cerebral Arteriogram (4 Vessel) >> Marred by motion artifact, grossly no evidence of aneurysm, AV shunting, DAVF, extracranial/intracranial dissections or of occlusions/stenosis; venous outflow intact  3/7 MRI Brain>>No significant interval change in size of the posterior right temporal lobe intraparenchymal hematoma measuring approximately 2.9 x 2.6 x 1.4 cm with mild surrounding vasogenic edema. No focus of abnormal contrast enhancement identified.  Micro Data:  3/6 COVID negative 3/6 MRSA PCR negative  Antimicrobials:  n/a  Interim History / Subjective:  Remains on cleviprex at 7 mg/hr-> actively weaning No complaints. Ready to get  home   Objective   Blood pressure 140/83, pulse 68, temperature 98.2 F (36.8 C), temperature source Oral, resp. rate (!) 28, height 5\' 10"  (1.778 m), weight 98.1 kg, SpO2 98 %.        Intake/Output Summary (Last 24 hours) at 06/29/2020 1610 Last data filed at 06/29/2020 0600 Gross per 24 hour  Intake 1405.61 ml  Output 1805 ml  Net -399.39 ml   Filed Weights   06/25/20 0300  Weight: 98.1 kg    General: Elderly male, awake, calm, no distress HEENT: MM pink/moist Neuro: Awake, alert, more oriented today, pleasant and following commands, moving all extremities without extremity weakness or facial droop  CV: s1s2 bradycardic, no m/r/g PULM: On room air, no respiratory distress, no rhonchi or wheezing GI: soft, bsx4 active  Extremities: warm/dry, no edema  Skin: no rashes or lesions  General:  Elderly male sitting upright in bed in NAD HEENT: MM pink/moist, pupils 3/reactive Neuro: Awake, oriented x 3, MAE, no deficits noted CV: rr ir, NSR with frequent PVCs PULM:  Non labored, CTA GI: soft, bs active, NT Extremities: warm/dry, no LE edema  Skin: no rashes   Afebrile  UOP 1.8L/ 24hr  BM x2 overnight Labs reviewed   Resolved Hospital Problem list     Assessment & Plan:    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 CT/MRI Brain demonstrated R temporal ICH (as above). S/p Neuro IR cerebral arteriogram which was grossly normal without AVM noted -Echo with EF 50 to 55% and moderate biatrial dilation P: -Neurology primary, MRI without further extension or obvious AVM -No further seizure activity, continue Keppra and Ativan as needed - SLP following, diet per SLP, clear liquids - SBP goals < 160, see below -Continue Cleviprex, home amlodipine and beta blovcker started today, wean as able.  Goal SBP<160 - ongoing PT/  OT  Bradycardia likely in the setting of sedation Bradycardia attributed to Precedex  TTE ok  P: - resolved, continue telemetry monitoring - continue reduced dosed home Abilify and Zoloft   Hypertension History of hypertension with SBPs reportedly > 200 on EMS arrival. Home regimen includes Coreg, Lisinopril, HCTZ. P: - norvasc increased 5-> 10mg  daily - coreg 3.125mg  BID added - continue HCTZ 12.5 mg daily, lisinopril 40 mg daily  - continue to wean cleviprex for SBP goal < 160, once off, can transfer out of ICU - stop IVF  Hypokalemia HypoMag  - KCL x 2 runs now f/b 40 meq BID - Magnesium 4mg  now - recheck K at 1600 - repeat BMET/ mag in am   History of PTSD -Continue home Abilify and Zoloft - change Klonopin to PRN   Best practice (evaluated daily)  Diet: Clear liquid- per SLP Pain/Anxiety/Delirium protocol (if indicated): Klonopin prn  VAP protocol (if indicated): N/A DVT prophylaxis: Per Neuro, SCDs GI prophylaxis: PPI Glucose control: SSI Mobility: Bedrest Disposition: ICU Family communication: Per primary  Goals of Care:  Last date of multidisciplinary goals of care discussion: Per primary team Family and staff present:  Summary of discussion:  Follow up goals of care discussion due: 3/14 Code Status: Full  Labs   CBC: Recent Labs  Lab 06/25/20 0350 06/26/20 0217 06/27/20 0344 06/28/20 0020 06/29/20 0156  WBC 14.1* 9.3 7.6 7.9 8.0  NEUTROABS 10.3*  --   --   --   --  HGB 17.0 15.5 14.6 14.8 15.6  HCT 52.9* 44.5 41.6 43.2 44.5  MCV 89.5 84.1 84.7 84.9 83.6  PLT 145* 102* 95* 92* 98*    Basic Metabolic Panel: Recent Labs  Lab 06/25/20 0350 06/25/20 0800 06/26/20 0217 06/27/20 0344 06/27/20 1012 06/28/20 0020 06/29/20 0156  NA 140  --  139 139  --  135 135  K 3.0*  --  3.1* 3.0*  --  2.8* 3.1*  CL 98  --  103 105  --  101 99  CO2 24  --  24 25  --  25 28  GLUCOSE 222*  --  140* 119*  --  114* 125*  BUN 15  --  16 15  --  7* 5*   CREATININE 1.12  --  0.99 0.84  --  0.74 0.74  CALCIUM 9.7  --  9.4 9.2  --  8.8* 9.7  MG  --  1.9  --   --  1.9 1.6*  --   PHOS  --  2.5  --   --   --   --   --    GFR: Estimated Creatinine Clearance: 98 mL/min (by C-G formula based on SCr of 0.74 mg/dL). Recent Labs  Lab 06/26/20 0217 06/27/20 0344 06/28/20 0020 06/29/20 0156  WBC 9.3 7.6 7.9 8.0    Liver Function Tests: Recent Labs  Lab 06/25/20 0350  AST 35  ALT 23  ALKPHOS 51  BILITOT 1.1  PROT 7.5  ALBUMIN 4.7   No results for input(s): LIPASE, AMYLASE in the last 168 hours. No results for input(s): AMMONIA in the last 168 hours.  ABG    Component Value Date/Time   HCO3 29.3 (H) 05/06/2012 1508   TCO2 27 06/25/2020 0340   O2SAT 45.0 05/06/2012 1508     Coagulation Profile: Recent Labs  Lab 06/25/20 0350  INR 1.0    Cardiac Enzymes: No results for input(s): CKTOTAL, CKMB, CKMBINDEX, TROPONINI in the last 168 hours.  HbA1C: Hgb A1c MFr Bld  Date/Time Value Ref Range Status  06/25/2020 08:00 AM 5.6 4.8 - 5.6 % Final    Comment:    (NOTE) Pre diabetes:          5.7%-6.4%  Diabetes:              >6.4%  Glycemic control for   <7.0% adults with diabetes     CBG: Recent Labs  Lab 06/28/20 1156 06/28/20 1944 06/28/20 2327 06/29/20 0311 06/29/20 0708  GLUCAP 128* 131* 120* 141* 119*        CRITICAL CARE Performed by: Kennieth Rad   Total critical care time: 30 minutes  Critical care time was exclusive of separately billable procedures and treating other patients.  Critical care was necessary to treat or prevent imminent or life-threatening deterioration.  Critical care was time spent personally by me on the following activities: development of treatment plan with patient and/or surrogate as well as nursing, discussions with consultants, evaluation of patient's response to treatment, examination of patient, obtaining history from patient or surrogate, ordering and performing  treatments and interventions, ordering and review of laboratory studies, ordering and review of radiographic studies, pulse oximetry and re-evaluation of patient's condition.   Kennieth Rad, ACNP [facility] 06/29/2020, 8:25 AM  See Amion for pager If no response to pager, please call [phone] until 7pm After 7:00 pm call Elink  [phone]

## 2020-06-29 NOTE — Progress Notes (Signed)
Inpatient Rehabilitation Admissions Coordinator   I met with patient and his daughter at bedside at our 2 pm meeting as planned. Noted they have chosen [facility]. We will sign off at this time.  Danne Baxter, RN, MSN Rehab Admissions Coordinator [phone] 06/29/2020 2:05 PM

## 2020-06-29 NOTE — TOC Progression Note (Signed)
Transition of Care [facility]) - Progression Note    Patient Details  Name: Mathew Malone MRN: [id number] Date of Birth: 08/27/1945  Transition of Care [facility]) CM/SW Contact  Bartholomew Crews, RN Phone Number: [phone] 06/29/2020, 9:40 AM  Clinical Narrative:     Received call from Jace at [facility]. Family called him to accept bed offer. Confirmed with daughter, Mathew Malone, at patient bedside. Family will plan on providing transportation for patient at time of discharge. Patient continues on cleviprex drip at this time for blood pressure management. Spoke with Jace to discuss that patient not medically ready. Patient can admit over weekend if medically ready. TOC following for transition needs.   Expected Discharge Plan: IP Rehab Facility Barriers to Discharge: Continued Medical Work up  Expected Discharge Plan and Services Expected Discharge Plan: [facility] In-house Referral: Clinical Social Work Discharge Planning Services: CM Consult Post Acute Care Choice: IP Rehab Living arrangements for the past 2 months: Single Family Home                 DME Arranged: N/A DME Agency: NA       HH Arranged: NA HH Agency: NA         Social Determinants of Health (SDOH) Interventions    Readmission Risk Interventions No flowsheet data found.

## 2020-06-29 NOTE — Progress Notes (Signed)
Off cleviprex. Still needing IV PRNs. HR still running in 66s precluding Coreg.  Will add hydralazine PO 50 TID  -- Amie Portland, MD Stroke Neurology Pager: [phone]

## 2020-06-29 NOTE — Progress Notes (Signed)
STROKE TEAM PROGRESS NOTE   INTERVAL HISTORY Patient was seen and examined this morning.  No family at bedside. No agitation overnight Blood pressure has been ranging from 110s to 160s, running Cleviprex at 5   Vitals:   06/29/20 0615 06/29/20 0630 06/29/20 0645 06/29/20 0711  BP: 132/79 (!) 143/82 140/83   Pulse: 65 67 68   Resp: (!) 26 (!) 9 (!) 28   Temp:    98.2 F (36.8 C)  TempSrc:    Oral  SpO2: 98% 97% 98%   Weight:      Height:       CBC:  Recent Labs  Lab 06/25/20 0350 06/26/20 0217 06/28/20 0020 06/29/20 0156  WBC 14.1*   < > 7.9 8.0  NEUTROABS 10.3*  --   --   --   HGB 17.0   < > 14.8 15.6  HCT 52.9*   < > 43.2 44.5  MCV 89.5   < > 84.9 83.6  PLT 145*   < > 92* 98*   < > = values in this interval not displayed.   Basic Metabolic Panel:  Recent Labs  Lab 06/25/20 0800 06/26/20 0217 06/27/20 1012 06/28/20 0020 06/29/20 0156  NA  --    < >  --  135 135  K  --    < >  --  2.8* 3.1*  CL  --    < >  --  101 99  CO2  --    < >  --  25 28  GLUCOSE  --    < >  --  114* 125*  BUN  --    < >  --  7* 5*  CREATININE  --    < >  --  0.74 0.74  CALCIUM  --    < >  --  8.8* 9.7  MG 1.9  --  1.9 1.6*  --   PHOS 2.5  --   --   --   --    < > = values in this interval not displayed.   Lipid Panel:  Recent Labs  Lab 06/25/20 0800  CHOL 141  TRIG 41  HDL 57  CHOLHDL 2.5  VLDL 8  LDLCALC 76   HgbA1c:  Recent Labs  Lab 06/25/20 0800  HGBA1C 5.6   Urine Drug Screen:  Recent Labs  Lab 06/25/20 1409  LABOPIA NONE DETECTED  COCAINSCRNUR NONE DETECTED  LABBENZ NONE DETECTED  AMPHETMU NONE DETECTED  THCU POSITIVE*  LABBARB NONE DETECTED    Alcohol Level  Recent Labs  Lab 06/25/20 0800  ETH <10    IMAGING CT head from 06/27/2020 Stable 2.8 cm intraparenchymal hematoma within the posterior right temporal lobe with surrounding mild mass effect. No midline shift. No interval hemorrhage. Ventricular size is normal.  MRI brain with and without  contrast03/10/2020 IMPRESSION: 1. No significant interval change in size of the posterior right temporal lobe intraparenchymal hematoma measuring approximately 2.9 x 2.6 x 1.4 cm with mild surrounding vasogenic edema. No focus of abnormal contrast enhancement identified. 2. Prominent vessels in the superior aspect of the hematoma may represent displaced leptomeningeal vessels although the possibility of a micro AVM cannot be entirely excluded.  EEG adult Result Date: 06/26/2020 IMPRESSION: This study showed evidence of potential epileptogenicity arising from right posterior temporal region as well as cortical dysfunction in the right temporal region likely secondary to underlying bleed. No seizures were seen throughout the recording. Priyanka [name]  PHYSICAL EXAM Vitals:   06/29/20 0645 06/29/20 0711  BP: 140/83   Pulse: 68   Resp: (!) 28   Temp:  98.2 F (36.8 C)  SpO2: 98%   General: Remains off sedation, comfortably sleeping in bed HEENT: Normocephalic, improving left lateral tongue laceration from his tongue bite CVS: Heart rate improved regular in the 60s. Extremities warm well perfused Neurological exam Awake alert oriented x3 No gross dysarthria Aphasia Cranial nerves II to XII intact Motor exam with no drift in any of the 4 extremities-5/5 all over. Sensation intact light touch all over Coordination: Finger-nose-finger testing intact with no dysmetria Gait testing deferred at this time   Current Facility-Administered Medications:  .   stroke: mapping our early stages of recovery book, , Does not apply, Once, Theda Sers, Unk Pinto, MD .  acetaminophen (TYLENOL) tablet 650 mg, 650 mg, Oral, Q4H PRN **OR** acetaminophen (TYLENOL) 160 MG/5ML solution 650 mg, 650 mg, Per Tube, Q4H PRN **OR** acetaminophen (TYLENOL) suppository 650 mg, 650 mg, Rectal, Q4H PRN, Gwinda Maine, MD .  amLODipine (NORVASC) tablet 10 mg, 10 mg, Oral, Daily, Amie Portland, MD .  ARIPiprazole  (ABILIFY) tablet 10 mg, 10 mg, Oral, Daily, Amie Portland, MD, 10 mg at 06/28/20 1024 .  carvedilol (COREG) tablet 3.125 mg, 3.125 mg, Oral, BID WC, Amie Portland, MD, 3.125 mg at 06/29/20 0820 .  Chlorhexidine Gluconate Cloth 2 % PADS 6 each, 6 each, Topical, Daily, Rosalin Hawking, MD, 6 each at 06/27/20 1135 .  clevidipine (CLEVIPREX) infusion 0.5 mg/mL, 0-21 mg/hr, Intravenous, Continuous, Amie Portland, MD, Last Rate: 10 mL/hr at 06/29/20 0800, 5 mg/hr at 06/29/20 0800 .  clonazePAM (KLONOPIN) tablet 0.5 mg, 0.5 mg, Oral, BID PRN, Jennelle Human B, NP .  hydrochlorothiazide (MICROZIDE) capsule 12.5 mg, 12.5 mg, Oral, Daily, Amie Portland, MD, 12.5 mg at 06/28/20 1025 .  levETIRAcetam (KEPPRA) tablet 1,500 mg, 1,500 mg, Oral, BID, Olivencia-Simmons, Ivelisse, NP, 1,500 mg at 06/28/20 2157 .  lisinopril (ZESTRIL) tablet 40 mg, 40 mg, Oral, Daily, Olivencia-Simmons, Ivelisse, NP, 40 mg at 06/28/20 1024 .  LORazepam (ATIVAN) injection 1 mg, 1 mg, Intravenous, Q15 min PRN, Metzger-Cihelka, Desiree, NP .  magnesium sulfate IVPB 4 g 100 mL, 4 g, Intravenous, Once, Simpson, Paula B, NP .  pantoprazole (PROTONIX) EC tablet 40 mg, 40 mg, Oral, QHS, Amie Portland, MD, 40 mg at 06/28/20 2156 .  potassium chloride 10 mEq in 100 mL IVPB, 10 mEq, Intravenous, Q1 Hr x 2, Simpson, Paula B, NP .  potassium chloride SA (KLOR-CON) CR tablet 40 mEq, 40 mEq, Oral, BID, Amie Portland, MD, 40 mEq at 06/28/20 2158 .  senna-docusate (Senokot-S) tablet 1 tablet, 1 tablet, Oral, BID, Gwinda Maine, MD, 1 tablet at 06/28/20 2156 .  sertraline (ZOLOFT) tablet 50 mg, 50 mg, Oral, BID, Amie Portland, MD, 50 mg at 06/29/20 0820 .  sodium chloride flush (NS) 0.9 % injection 3 mL, 3 mL, Intravenous, Once, Pollina, Gwenyth Allegra, MD   ASSESSMENT/PLAN Mathew Malone is a 75 y.o. male with history of HTN, HLD presenting with new onset seizures and AMS. 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   MRI brain reviewed: Does  not have the typical appearance of cerebral amyloid angiopathy. There was concern for micro-AVM but diagnostic cerebral angiogram done by Dr. Estanislado Pandy reports no AVM aneurysm or dural AV fistula.  ICH has been stable on multiple CTs and MRIs done throughout the course of this hospitalization for now.    ICH: right  temporal lobe ICH, etiology likely hypertensive,   CT head right temporal lobe ICH about 5cc  CTA head & neck stable hematoma, 7 mm blush of contrast at the posterior right temporal lobe, immediately adjacent to the right temporal lobe hemorrhage  Cerebral angiogram done (06/26/2020): no AVM aneurysm or dural AV fistula reported-CT angio rules out AVM.  Repeat CT 06/27/2020 with stable 2.8 cm IPH within the posterior right temporal lobe with surrounding mild mass-effect.  No midline shift.  LDL 76  HgbA1c 5.6  VTE prophylaxis - SCDs  2D echo: EF 50-55%, moderate biatrial dilation  ASA 81mg  prior to admission, now no AP/AC d/t bleed  Therapy recommendations:  OT recs CIR,   Disposition:  CIR or home depending on how he does over the next few days.  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 D/C EEG leads 03/08/202  Bradycardia resolved with discontinuation of Precedex.  Hypertension  Home meds:  Maxzide, Coreg, Zestril, Hydrodiuril-unclear compliance-suspect non-compliance to meds. [place] records not available. . On lisinopril 40, amlodipine 5 and HCTZ 12.5. Marland Kitchen Change amlodipine to 10 daily, add carvedilol 3.125 mg twice daily. . Attempt to wean Cleviprex-once off of  Cleviprex and stable for about 6 to 12 hours, can be moved out of the ICU. Marland Kitchen Change SBP goal to <160 . Long-term BP goal normotensive  Sinus Bradycardia  Worsened while on high dose precedex and better after discontinuation  Continue to monitor telemetry  Appears asymptomatic at this time  Hyperlipidemia  Home meds:  Lovaza, Q10  LDL 76, goal < 70  Consider statin at discharge  Dysphagia   Passed bedside swallow eval  Speech on board  PTSD, history  Sertraline 50 mg bid  Ability 10 mg daily  Clonazepam 0.5mg  bid prn  Hypokalemia  K 3.1-management  Thrombocytopenia  Platelet count 125 prior admission  Platelet count 145->102->95 ->92-->98  Repeat level in am and continue to monitor.  No evidence of active bleeding.  No indication for transfusion at this time.  Other Stroke Risk Factors  Advanced Age >/= 65   OSA on CPAP at home  Previous Cigarette smoker  Obesity, Body mass index is 31.03 kg/m., BMI >/= 30 associated with increased stroke risk, recommend weight loss, diet and exercise as appropriate   Other Active Problems  PTSD- high risk for delirium. Should resume mood/SSRi meds when able  Prostate Cancer-MRI brain negative for any underlying metastatic lesion at this time.  Chronic urinary retention- resume oxybutynin when able  Grief: Recently lost his wife. He has been having difficulty managing alone. He is visiting his two daughters that live in [place] from [place], [place] [facility]-orders placed in the chart-pending chart availability.  ConsultS: PCCM-appreciate consultation.  Hospital day # 4  Please feel free to call with any questions.  Discussed with Kennieth Rad, NP and Dr. Halford Chessman on the unit  -- Amie Portland, MD Stroke Neurology Pager: [phone]  [place] Performed by: Amie Portland, MD Total critical care time: 30 minutes Critical care time was exclusive  of separately billable procedures and treating other patients  and/or supervising APPs/Residents/Students Critical care was necessary to treat or prevent imminent or life-threatening deterioration due to [place] This patient is critically ill and at significant risk for neurological worsening and/or death and care requires constant monitoring. Critical care was time spent personally by me on the following activities: development of treatment plan with patient and/or surrogate as well as nursing, discussions with consultants, evaluation of patient's response to treatment, examination of patient, obtaining history from patient or surrogate, ordering and performing treatments and interventions, ordering and review of laboratory studies, ordering and review of radiographic studies, pulse oximetry, re-evaluation of patient's condition, participation in multidisciplinary rounds and medical decision making of high complexity in the care of this patient.

## 2020-06-30 DIAGNOSIS — I619 Nontraumatic intracerebral hemorrhage, unspecified: Secondary | ICD-10-CM | POA: Diagnosis not present

## 2020-06-30 LAB — CBC
HCT: 42.5 % (ref 39.0–52.0)
Hemoglobin: 14.7 g/dL (ref 13.0–17.0)
MCH: 29.3 pg (ref 26.0–34.0)
MCHC: 34.6 g/dL (ref 30.0–36.0)
MCV: 84.8 fL (ref 80.0–100.0)
Platelets: 105 10*3/uL — ABNORMAL LOW (ref 150–400)
RBC: 5.01 MIL/uL (ref 4.22–5.81)
RDW: 12.9 % (ref 11.5–15.5)
WBC: 8.4 10*3/uL (ref 4.0–10.5)
nRBC: 0 % (ref 0.0–0.2)

## 2020-06-30 LAB — BASIC METABOLIC PANEL
Anion gap: 8 (ref 5–15)
BUN: 10 mg/dL (ref 8–23)
CO2: 27 mmol/L (ref 22–32)
Calcium: 9.7 mg/dL (ref 8.9–10.3)
Chloride: 100 mmol/L (ref 98–111)
Creatinine, Ser: 1.01 mg/dL (ref 0.61–1.24)
GFR, Estimated: >60 mL/min (ref >60)
Glucose, Bld: 140 mg/dL — ABNORMAL HIGH (ref 70–99)
Potassium: 4.1 mmol/L (ref 3.5–5.1)
Sodium: 135 mmol/L (ref 135–145)

## 2020-06-30 LAB — MAGNESIUM: Magnesium: 2.1 mg/dL (ref 1.7–2.4)

## 2020-06-30 MED ORDER — HYDRALAZINE HCL 50 MG PO TABS
100.0000 mg | ORAL_TABLET | Freq: Three times a day (TID) | ORAL | Status: DC
  Administered 2020-06-30 – 2020-07-01 (×4): 100 mg via ORAL
  Filled 2020-06-30 (×4): qty 2

## 2020-06-30 NOTE — Progress Notes (Signed)
Physical Therapy Treatment Patient Details Name: Mathew Malone MRN: [id number] DOB: 12-11-1945 Today's Date: 06/30/2020    History of Present Illness Braelon Sprung is a 75 y.o. male walked downstairs and collapsed, incontinent, with left side not moving but then started moving LLE. 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  PMHx: HTN, PTSD, HOH    PT Comments    Pt admitted with above diagnosis. Pt was able to ambulate with improved balance each visit as well as pt with improving inattention to surroundings. Pt continues to need cues for safety due to impulsivitiy at times.  Pt also lacks safety awareness during transitional movements.  Pt currently with functional limitations due to balance and endurance deficits. Pt will benefit from skilled PT to increase their independence and safety with mobility to allow discharge to the venue listed below.     Follow Up Recommendations  CIR;Supervision - Intermittent     Equipment Recommendations   (TBA)    Recommendations for Other Services Rehab consult     Precautions / Restrictions Precautions Precautions: Fall Restrictions Weight Bearing Restrictions: No    Mobility  Bed Mobility Overal bed mobility: Needs Assistance Bed Mobility: Supine to Sit     Supine to sit: Supervision     General bed mobility comments: No assist to come EOB.    Transfers Overall transfer level: Needs assistance Equipment used: Rolling walker (2 wheeled) Transfers: Sit to/from [facility] Sit to Stand: Min assist         General transfer comment: Pt reports no dizziness and steadying assist to stand with cues for hand placement as well.  Ambulation/Gait Ambulation/Gait assistance: Min assist Gait Distance (Feet): 280 Feet Assistive device: Rolling walker (2 wheeled) Gait Pattern/deviations: Step-through pattern;Decreased stride length;Drifts right/left   Gait velocity  interpretation: <1.31 ft/sec, indicative of household ambulator General Gait Details: Pt able to ambulate in hallway.  Was running into objects on his left consistently needing cues but was doing a better job scanning environment than last visit. continue to cue pt to scan environment increasing scan to left.  It appears that  inattention to pts left side is improving.  Pt not dizzy today during session.   Stairs             Wheelchair Mobility    Modified Rankin (Stroke Patients Only) Modified Rankin (Stroke Patients Only) Pre-Morbid Rankin Score: No significant disability Modified Rankin: Moderately severe disability     Balance Overall balance assessment: Needs assistance Sitting-balance support: No upper extremity supported;Feet supported Sitting balance-Leahy Scale: Fair Sitting balance - Comments: Pt can sit with min guard assist on EOB but needs close guard assist due to impulsivity.   Standing balance support: Bilateral upper extremity supported;During functional activity Standing balance-Leahy Scale: Poor Standing balance comment: Pt was able to stand with min guard to min assist for balance with UE support and RW.                            Cognition Arousal/Alertness: Awake/alert Behavior During Therapy: Impulsive Overall Cognitive Status: Impaired/Different from baseline Area of Impairment: Following commands;Safety/judgement;Awareness                       Following Commands: Follows one step commands consistently Safety/Judgement: Decreased awareness of safety;Decreased awareness of deficits Awareness: Intellectual   General Comments: Pt still impulsive today - kept bringing LEs off bed prior  to PT being ready for pt to get OOB.  Pt also soaked in urine but seemed unaware.      Exercises General Exercises - Lower Extremity Ankle Circles/Pumps: AROM;Both;10 reps;Seated Long Arc Quad: AROM;Both;10 reps;Seated    General Comments         Pertinent Vitals/Pain Pain Assessment: No/denies pain    Home Living                      Prior Function            PT Goals (current goals can now be found in the care plan section) Acute Rehab PT Goals Patient Stated Goal: to return to [facility] Progress towards PT goals: Progressing toward goals    Frequency    Min 4X/week      PT Plan Current plan remains appropriate    Co-evaluation              AM-PAC PT "6 Clicks" Mobility   Outcome Measure  Help needed turning from your back to your side while in a flat bed without using bedrails?: None Help needed moving from lying on your back to sitting on the side of a flat bed without using bedrails?: None Help needed moving to and from a bed to a chair (including a wheelchair)?: A Little Help needed standing up from a chair using your arms (e.g., wheelchair or bedside chair)?: A Little Help needed to walk in hospital room?: A Little Help needed climbing 3-5 steps with a railing? : A Lot 6 Click Score: 19    End of Session Equipment Utilized During Treatment: Gait belt Activity Tolerance: Patient tolerated treatment well Patient left: Other (comment) (OT took over with pt in hallway) Nurse Communication: Mobility status PT Visit Diagnosis: Muscle weakness (generalized) (M62.81);Unsteadiness on feet (R26.81);Dizziness and giddiness (R42)     Time: 1125-1140 PT Time Calculation (min) (ACUTE ONLY): 15 min  Charges:  $Gait Training: 8-22 mins                     Liv Rallis M,PT Acute Rehab Services [phone] (pager)   Alvira Philips 06/30/2020, 2:03 PM

## 2020-06-30 NOTE — Progress Notes (Addendum)
STROKE TEAM PROGRESS NOTE   INTERVAL HISTORY Patient was seen and examined this morning.  No family at bedside. No agitation overnight Cleviprex discontinued yesterday. Hydralazine p.o. 50 3 times daily added. Will transfer to floor Family wants him for CIR at [facility].  Rehab aware.   Vitals:   06/30/20 0630 06/30/20 0700 06/30/20 0800 06/30/20 0818  BP: 120/81 (!) 136/97 (!) 154/85 (!) 147/87  Pulse: 65 60 63 69  Resp: (!) 22 16 (!) 27   Temp:      TempSrc:      SpO2: 96% 98% 97%   Weight:      Height:       CBC:  Recent Labs  Lab 06/25/20 0350 06/26/20 0217 06/29/20 0156 06/30/20 0227  WBC 14.1*   < > 8.0 8.4  NEUTROABS 10.3*  --   --   --   HGB 17.0   < > 15.6 14.7  HCT 52.9*   < > 44.5 42.5  MCV 89.5   < > 83.6 84.8  PLT 145*   < > 98* 105*   < > = values in this interval not displayed.   Basic Metabolic Panel:  Recent Labs  Lab 06/25/20 0800 06/26/20 0217 06/28/20 0020 06/29/20 0156 06/29/20 1823 06/30/20 0227  NA  --    < > 135 135  --  135  K  --    < > 2.8* 3.1* 4.0 4.1  CL  --    < > 101 99  --  100  CO2  --    < > 25 28  --  27  GLUCOSE  --    < > 114* 125*  --  140*  BUN  --    < > 7* 5*  --  10  CREATININE  --    < > 0.74 0.74  --  1.01  CALCIUM  --    < > 8.8* 9.7  --  9.7  MG 1.9   < > 1.6*  --   --  2.1  PHOS 2.5  --   --   --   --   --    < > = values in this interval not displayed.   Lipid Panel:  Recent Labs  Lab 06/25/20 0800  CHOL 141  TRIG 41  HDL 57  CHOLHDL 2.5  VLDL 8  LDLCALC 76   HgbA1c:  Recent Labs  Lab 06/25/20 0800  HGBA1C 5.6   Urine Drug Screen:  Recent Labs  Lab 06/25/20 1409  LABOPIA NONE DETECTED  COCAINSCRNUR NONE DETECTED  LABBENZ NONE DETECTED  AMPHETMU NONE DETECTED  THCU POSITIVE*  LABBARB NONE DETECTED    Alcohol Level  Recent Labs  Lab 06/25/20 0800  ETH <10   IMAGING CT head from 06/27/2020 Stable 2.8 cm intraparenchymal hematoma within the posterior right temporal lobe with  surrounding mild mass effect. No midline shift. No interval hemorrhage. Ventricular size is normal.  MRI brain with and without contrast03/10/2020 IMPRESSION: 1. No significant interval change in size of the posterior right temporal lobe intraparenchymal hematoma measuring approximately 2.9 x 2.6 x 1.4 cm with mild surrounding vasogenic edema. No focus of abnormal contrast enhancement identified. 2. Prominent vessels in the superior aspect of the hematoma may represent displaced leptomeningeal vessels although the possibility of a micro AVM cannot be entirely excluded.  EEG adult Result Date: 06/26/2020 IMPRESSION: This study showed evidence of potential epileptogenicity arising from right posterior temporal region as well as cortical  dysfunction in the right temporal region likely secondary to underlying bleed. No seizures were seen throughout the recording. [name]   PHYSICAL EXAM Vitals:   06/30/20 0800 06/30/20 0818  BP: (!) 154/85 (!) 147/87  Pulse: 63 69  Resp: (!) 27   Temp:    SpO2: 97%   General: Remains off sedation, comfortably sleeping in bed HEENT: Normocephalic, improving left lateral tongue laceration from his tongue bite CVS: Heart rate improved regular in the 60s. Extremities warm well perfused Neurological exam Awake alert oriented x3 No gross dysarthria Aphasia Cranial nerves II to XII intact Motor exam with no drift in any of the 4 extremities-5/5 all over. Sensation intact light touch all over Coordination: Finger-nose-finger testing intact with no dysmetria Gait testing deferred at this time Unchanged from yesterday  Current Facility-Administered Medications:  .  acetaminophen (TYLENOL) tablet 650 mg, 650 mg, Oral, Q4H PRN **OR** acetaminophen (TYLENOL) 160 MG/5ML solution 650 mg, 650 mg, Per Tube, Q4H PRN **OR** acetaminophen (TYLENOL) suppository 650 mg, 650 mg, Rectal, Q4H PRN, Gwinda Maine, MD .  amLODipine (NORVASC) tablet 10 mg, 10 mg,  Oral, Daily, Amie Portland, MD, 10 mg at 06/29/20 1610 .  ARIPiprazole (ABILIFY) tablet 10 mg, 10 mg, Oral, Daily, Amie Portland, MD, 10 mg at 06/29/20 0950 .  carvedilol (COREG) tablet 3.125 mg, 3.125 mg, Oral, BID WC, Amie Portland, MD, 3.125 mg at 06/30/20 0818 .  Chlorhexidine Gluconate Cloth 2 % PADS 6 each, 6 each, Topical, Daily, Rosalin Hawking, MD, 6 each at 06/29/20 [phone] .  clevidipine (CLEVIPREX) infusion 0.5 mg/mL, 0-21 mg/hr, Intravenous, Continuous, Chesley Mires, MD, Stopped at 06/29/20 1015 .  clonazePAM (KLONOPIN) tablet 0.5 mg, 0.5 mg, Oral, BID PRN, Jennelle Human B, NP .  hydrALAZINE (APRESOLINE) injection 10 mg, 10 mg, Intravenous, Q4H PRN, Jennelle Human B, NP, 10 mg at 06/29/20 1724 .  hydrALAZINE (APRESOLINE) tablet 50 mg, 50 mg, Oral, Q8H, Amie Portland, MD, 50 mg at 06/30/20 0554 .  hydrochlorothiazide (MICROZIDE) capsule 12.5 mg, 12.5 mg, Oral, Daily, Amie Portland, MD, 12.5 mg at 06/29/20 1225 .  isosorbide mononitrate (IMDUR) 24 hr tablet 30 mg, 30 mg, Oral, Daily, Jennelle Human B, NP, 30 mg at 06/29/20 1610 .  levETIRAcetam (KEPPRA) tablet 1,500 mg, 1,500 mg, Oral, BID, Olivencia-Simmons, Ivelisse, NP, 1,500 mg at 06/29/20 2000 .  lisinopril (ZESTRIL) tablet 40 mg, 40 mg, Oral, Daily, Olivencia-Simmons, Ivelisse, NP, 40 mg at 06/29/20 0938 .  LORazepam (ATIVAN) injection 1 mg, 1 mg, Intravenous, Q15 min PRN, Metzger-Cihelka, Desiree, NP .  pantoprazole (PROTONIX) EC tablet 40 mg, 40 mg, Oral, QHS, Amie Portland, MD, 40 mg at 06/29/20 2000 .  potassium chloride SA (KLOR-CON) CR tablet 40 mEq, 40 mEq, Oral, TID, Jennelle Human B, NP, 40 mEq at 06/29/20 1953 .  senna-docusate (Senokot-S) tablet 1 tablet, 1 tablet, Oral, BID, Gwinda Maine, MD, 1 tablet at 06/29/20 2000 .  sertraline (ZOLOFT) tablet 50 mg, 50 mg, Oral, BID, Amie Portland, MD, 50 mg at 06/30/20 0816 .  sodium chloride flush (NS) 0.9 % injection 3 mL, 3 mL, Intravenous, Once, Pollina, Gwenyth Allegra,  MD   ASSESSMENT/PLAN Mr. Rossi Burdo is a 75 y.o. male with history of HTN, HLD presenting with new onset seizures and AMS. 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   MRI brain reviewed: Does not have the typical appearance of cerebral amyloid angiopathy. There was concern for micro-AVM but diagnostic cerebral angiogram done by Dr. Estanislado Pandy reports no AVM aneurysm or  dural AV fistula.  ICH has been stable on multiple CTs and MRIs done throughout the course of this hospitalization for now.    ICH: right temporal lobe ICH, etiology likely hypertensive,   CT head right temporal lobe ICH about 5cc  CTA head & neck stable hematoma, 7 mm blush of contrast at the posterior right temporal lobe, immediately adjacent to the right temporal lobe hemorrhage  Cerebral angiogram done (06/26/2020): no AVM aneurysm or dural AV fistula reported-CT angio rules out AVM.  Repeat CT 06/27/2020 with stable 2.8 cm IPH within the posterior right temporal lobe with surrounding mild mass-effect.  No midline shift.  LDL 76  HgbA1c 5.6  VTE prophylaxis - SCDs  2D echo: EF 50-55%, moderate biatrial dilation  ASA 81mg  prior to admission, now no AP/AC d/t bleed  Therapy recommendations:  OT recs CIR,   Disposition:  CIR or home depending on how he does over the next few days.  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 D/C EEG leads 03/08/202  Bradycardia resolved with discontinuation of Precedex.  Hypertension/hypertensive emergency-hypertensive emergency resolved.  Continuing to work on  optimizing blood pressures.  Home meds:  Maxzide, Coreg, Zestril, Hydrodiuril-unclear compliance-suspect non-compliance to meds. [place] records not available. . On lisinopril 40.  Amlodipine 10.  Carvedilol 3.125 which has been held few times due to bradycardia.  Hydrochlorothiazide 12.5.  Hydralazine 50 p.o. 3 times daily was added yesterday.  Can increase to hydralazine 100 3 times daily. . SBP goal less than 160.  Eventually will require normotension.  Sinus Bradycardia  Worsened while on high dose precedex and better after discontinuation  Continue to monitor telemetry  Appears asymptomatic at this time  Hyperlipidemia  Home meds:  Lovaza, Q10  LDL 76, goal < 70  Consider statin at discharge  Dysphagia   Passed bedside swallow eval  Speech on board  PTSD, history  Sertraline 50 mg bid  Abilify 10 mg daily  Clonazepam 0.5mg  bid prn  Hypokalemia-resolved  Thrombocytopenia  Platelet count 125 prior admission  Platelet count 145->102->95 ->92-->98-->105 (closer to baseline now)  Repeat level in am and continue to monitor.  No evidence of active bleeding.  No indication for transfusion at this time.  Other Stroke Risk Factors  Advanced Age >/= 65   OSA on CPAP at home  Previous Cigarette smoker  Obesity, Body mass index is 31.03 kg/m., BMI >/= 30 associated with increased stroke risk, recommend weight loss, diet and exercise as appropriate   Other Active Problems  PTSD- high risk for delirium. Should resume mood/SSRi meds when able  Prostate Cancer-MRI brain negative for any underlying metastatic lesion at this time.  Chronic urinary retention- resume oxybutynin when able  Grief: Recently lost his wife. He has been having difficulty managing alone. He is visiting his two daughters that live in [place] from [place], [place] [facility]-orders placed in the chart-pending chart availability.  ConsultS:  PCCM-appreciate consultation.  Hospital day # 5  Please feel free to call with any questions.  Transfer orders to floor put in.  Likely discharge to rehab tomorrow.  --  Amie Portland, MD Stroke Neurology Pager: [phone]  CRITICAL CARE ATTESTATION Performed by: Amie Portland, MD Total critical care time: 31 minutes Critical care time was exclusive of separately billable procedures and treating other patients and/or supervising APPs/Residents/Students Critical care was necessary to treat or prevent imminent or life-threatening deterioration due to [place] This patient is critically ill and at significant risk for neurological worsening and/or death and care requires constant monitoring. Critical care was time spent personally by me on the following activities: development of treatment plan with patient and/or surrogate as well as nursing, discussions with consultants, evaluation of patient's response to treatment, examination of patient, obtaining history from patient or surrogate, ordering and performing treatments and interventions, ordering and review of laboratory studies, ordering and review of radiographic studies, pulse oximetry, re-evaluation of patient's condition, participation in multidisciplinary rounds and medical decision making of high complexity in the care of this patient.

## 2020-06-30 NOTE — Progress Notes (Signed)
PCCM Interval Note  Off Cleviprex. Discussed with primary team. PCCM will sign off.  No charge.  Rodman Pickle, M.D. [facility] Pulmonary/Critical Care Medicine 06/30/2020 9:33 AM

## 2020-06-30 NOTE — Progress Notes (Addendum)
Occupational Therapy Treatment Patient Details Name: Mathew Malone MRN: [id number] DOB: 01/27/1946 Today's Date: 06/30/2020    History of present illness Mathew Malone is a 75 y.o. male walked downstairs and collapsed, incontinent, with left side not moving but then started moving LLE. 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  PMHx: HTN, PTSD, HOH   OT comments  Pt progressing towards established OT goals. Pt continues to present with cognitive deficits including poor planning, mental flexibility, attention, ST memory, and awareness. Pt performing three part trail making task with Max cues for recalling locations and problem solving finding them. Discussed his performance and pt presenting with poor awareness of deficits. Facilitating discussion on need for assistance with IADLs due to cognitive deficits; daughter present and confirming this is not baseline cognition. Continue to [facility] ([facility]) and will continue to follow acutely as admitted.    Follow Up Recommendations  CIR;Supervision/Assistance - 24 hour ([facility])    Equipment Recommendations  3 in 1 bedside commode    Recommendations for Other Services Rehab consult    Precautions / Restrictions Precautions Precautions: Fall Restrictions Weight Bearing Restrictions: No       Mobility Bed Mobility Overal bed mobility: Needs Assistance Bed Mobility: Supine to Sit     Supine to sit: Supervision     General bed mobility comments: OOB with PT upon arrival    Transfers Overall transfer level: Needs assistance Equipment used: Rolling walker (2 wheeled) Transfers: Sit to/from [facility] Sit to Stand: Min guard         General transfer comment: Min Guard A ofr safety    Balance Overall balance assessment: Needs assistance Sitting-balance support: No upper extremity supported;Feet supported Sitting balance-Leahy Scale: Fair Sitting balance  - Comments: Pt can sit with min guard assist on EOB but needs close guard assist due to impulsivity.   Standing balance support: Bilateral upper extremity supported;During functional activity Standing balance-Leahy Scale: Poor Standing balance comment: Pt was able to stand with min guard to min assist for balance with UE support and RW.                           ADL either performed or assessed with clinical judgement   ADL Overall ADL's : Needs assistance/impaired                     Lower Body Dressing: Min guard;Sit to/from stand Lower Body Dressing Details (indicate cue type and reason): Bending forward to adjust socks Toilet Transfer: Min guard;Ambulation;RW           Functional mobility during ADLs: Min guard;Supervision/safety;Rolling walker General ADL Comments: Focused session on cognition with trail making task     Vision       Perception     Praxis      Cognition Arousal/Alertness: Awake/alert Behavior During Therapy: Impulsive Overall Cognitive Status: Impaired/Different from baseline Area of Impairment: Following commands;Safety/judgement;Awareness;Attention;Problem solving;Memory                   Current Attention Level: Alternating;Selective Memory: Decreased short-term memory Following Commands: Follows one step commands consistently Safety/Judgement: Decreased awareness of safety;Decreased awareness of deficits Awareness: Intellectual Problem Solving: Requires verbal cues General Comments: Pt performing simple three part trail making task. Pt with poor ST memory and unable (at first) to recall the thre room numbers. Once recalling the numebrs, pt unabel to recall the  order. Requiring Max cues for recall of order. Pt demonsatrating poor organization of thought to problem solve how to locate room numbers. Pt with poor awareness of deficits and reporting that task was not difficult and he didnt need increased assistance.         Exercises Exercises: General Lower Extremity General Exercises - Lower Extremity Ankle Circles/Pumps: AROM;Both;10 reps;Seated Long Arc Quad: AROM;Both;10 reps;Seated   Shoulder Instructions       General Comments Daughter present at end of session and very supportive. Daughter agreeable that cognition is not at baseline. Discussed that pt will possibly need assistance with IADLs    Pertinent Vitals/ Pain       Pain Assessment: No/denies pain  Home Living                                          Prior Functioning/Environment              Frequency  Min 2X/week        Progress Toward Goals  OT Goals(current goals can now be found in the care plan section)  Progress towards OT goals: Progressing toward goals  Acute Rehab OT Goals Patient Stated Goal: to return to [facility] OT Goal Formulation: With patient/family Time For Goal Achievement: 07/11/20 Potential to Achieve Goals: Good ADL Goals Pt Will Perform Grooming: with min guard assist;standing Pt Will Perform Lower Body Dressing: with min guard assist;sit to/from stand Pt Will Transfer to Toilet: with supervision;ambulating;bedside commode Pt Will Perform Toileting - Clothing Manipulation and hygiene: with min guard assist;sitting/lateral leans;sit to/from stand Additional ADL Goal #1: Pt will participate in higher level cognitive tasks in order to assess safety and cognition with daily tasks.  Plan Discharge plan remains appropriate    Co-evaluation                 AM-PAC OT "6 Clicks" Daily Activity     Outcome Measure   Help from another person eating meals?: A Little Help from another person taking care of personal grooming?: A Little Help from another person toileting, which includes using toliet, bedpan, or urinal?: A Little Help from another person bathing (including washing, rinsing, drying)?: A Little Help from another person to put on and taking off regular upper body clothing?:  A Little Help from another person to put on and taking off regular lower body clothing?: A Little 6 Click Score: 18    End of Session Equipment Utilized During Treatment: Rolling walker;Gait belt  OT Visit Diagnosis: Unsteadiness on feet (R26.81);Muscle weakness (generalized) (M62.81);Other symptoms and signs involving cognitive function   Activity Tolerance Patient tolerated treatment well   Patient Left in chair;with call bell/phone within reach;with chair alarm set;with nursing/sitter in room;with family/visitor present   Nurse Communication Mobility status        Time: 7412-8786 OT Time Calculation (min): 20 min  Charges: OT General Charges $OT Visit: 1 Visit OT Treatments $Cognitive Funtion inital: Initial 15 mins $Cognitive Funtion additional: Additional15 mins  [place], OTR/L Acute Rehab Pager: [phone] Office: [place] 06/30/2020, 3:43 PM

## 2020-07-01 DIAGNOSIS — I619 Nontraumatic intracerebral hemorrhage, unspecified: Secondary | ICD-10-CM | POA: Diagnosis not present

## 2020-07-01 MED ORDER — SENNOSIDES-DOCUSATE SODIUM 8.6-50 MG PO TABS: 1.0000 | ORAL_TABLET | Freq: Two times a day (BID) | ORAL | Status: AC

## 2020-07-01 MED ORDER — HYDROCHLOROTHIAZIDE 12.5 MG PO CAPS: 12.5000 mg | ORAL_CAPSULE | Freq: Every day | ORAL | Status: AC

## 2020-07-01 MED ORDER — LORAZEPAM 2 MG/ML IJ SOLN: 1.0000 mg | INTRAMUSCULAR | 0 refills | Status: AC | PRN

## 2020-07-01 MED ORDER — ACETAMINOPHEN 160 MG/5ML PO SOLN: 650.0000 mg | ORAL | 0 refills | Status: AC | PRN

## 2020-07-01 MED ORDER — AMLODIPINE BESYLATE 10 MG PO TABS: 10.0000 mg | ORAL_TABLET | Freq: Every day | ORAL | Status: AC

## 2020-07-01 MED ORDER — PANTOPRAZOLE SODIUM 40 MG PO TBEC: 40.0000 mg | DELAYED_RELEASE_TABLET | Freq: Every day | ORAL | Status: AC

## 2020-07-01 MED ORDER — ACETAMINOPHEN 650 MG RE SUPP: 650.0000 mg | RECTAL | 0 refills | Status: AC | PRN

## 2020-07-01 MED ORDER — CARVEDILOL 3.125 MG PO TABS
3.1250 mg | ORAL_TABLET | Freq: Two times a day (BID) | ORAL | Status: AC
Start: 2020-07-01 — End: ?

## 2020-07-01 MED ORDER — HYDRALAZINE HCL 100 MG PO TABS: 100.0000 mg | ORAL_TABLET | Freq: Three times a day (TID) | ORAL | Status: AC

## 2020-07-01 MED ORDER — ISOSORBIDE MONONITRATE ER 30 MG PO TB24: 30.0000 mg | ORAL_TABLET | Freq: Every day | ORAL | Status: AC

## 2020-07-01 MED ORDER — CLONAZEPAM 0.5 MG PO TABS: 0.5000 mg | ORAL_TABLET | Freq: Two times a day (BID) | ORAL | 0 refills | Status: AC | PRN

## 2020-07-01 MED ORDER — CHLORHEXIDINE GLUCONATE CLOTH 2 % EX PADS: 6.0000 | MEDICATED_PAD | Freq: Every day | CUTANEOUS | Status: AC

## 2020-07-01 MED ORDER — LEVETIRACETAM 750 MG PO TABS
1500.0000 mg | ORAL_TABLET | Freq: Two times a day (BID) | ORAL | Status: AC
Start: 2020-07-01 — End: ?

## 2020-07-01 MED ORDER — SERTRALINE HCL 50 MG PO TABS
50.0000 mg | ORAL_TABLET | Freq: Two times a day (BID) | ORAL | Status: AC
Start: 2020-07-01 — End: ?

## 2020-07-01 MED ORDER — ACETAMINOPHEN 325 MG PO TABS: 650.0000 mg | ORAL_TABLET | ORAL | Status: AC | PRN

## 2020-07-01 MED ORDER — ARIPIPRAZOLE 10 MG PO TABS: 10.0000 mg | ORAL_TABLET | Freq: Every day | ORAL | Status: AC

## 2020-07-01 NOTE — Discharge Summary (Addendum)
Stroke Discharge Summary  Patient ID: Mathew Malone   MRN: [id number]      DOB: [birth year]  Date of Admission: 06/25/2020 Date of Discharge: 07/01/2020  Attending Physician:  Dr. Antony Contras  Consultant(s):  Treatment Team:  Amie Portland, MD, Neurology Critical Care Medicine Team, Dr. Chesley Mires  Patient's PCP:  Mathew Solian, MD  Discharge Diagnoses:  1. Acute posterior right temporal lobe intraparenchymal hemorrhage likely due to uncontrolled hypertension 2. New onset seizure.  3. S/P 4 vessel cerebral artreriogram on 3/7 with no evidence of aneurysm,AV shunting,DAVF, extracranial or intracranial dissections or of occlusions or stenosis  Active Problems:   Stroke, hemorrhagic (HCC)  Medications to be continued on Rehab Allergies as of 07/01/2020   No Known Allergies      Medication List     STOP taking these medications    ascorbic acid 500 MG tablet Commonly known as: VITAMIN C   aspirin 81 MG chewable tablet   calcium-vitamin D 500-200 MG-UNIT tablet Commonly known as: OSCAL WITH D   [place] 450 MG Caps   cetirizine 10 MG tablet Commonly known as: ZYRTEC   EYE HEALTH FORMULA PO   gabapentin 600 MG tablet Commonly known as: NEURONTIN   galantamine 8 MG 24 hr capsule Commonly known as: RAZADYNE ER   Geritol Liqd   hydrochlorothiazide 25 MG tablet Commonly known as: HYDRODIURIL Replaced by: hydrochlorothiazide 12.5 MG capsule   HYDROcodone-acetaminophen 5-500 MG tablet Commonly known as: VICODIN   ibuprofen 800 MG tablet Commonly known as: ADVIL   mirabegron ER 50 MG Tb24 tablet Commonly known as: MYRBETRIQ   OVER THE COUNTER MEDICATION   Phentermine-Topiramate 7.5-46 MG Cp24 Commonly known as: Qsymia   Plenvu 140 g Solr Generic drug: PEG-KCl-NaCl-NaSulf-Na Asc-C   polyvinyl alcohol 1.4 % ophthalmic solution Commonly known as: LIQUIFILM TEARS   simvastatin 40 MG tablet Commonly known as: ZOCOR   TOTAL MEMORY & FOCUS  FORMULA PO       TAKE these medications    acetaminophen 325 MG tablet Commonly known as: TYLENOL Take 2 tablets (650 mg total) by mouth every 4 (four) hours as needed for mild pain (or temp > 37.5 C (99.5 F)). What changed:  medication strength how much to take when to take this reasons to take this   acetaminophen 160 MG/5ML solution Commonly known as: TYLENOL Place 20.3 mLs (650 mg total) into feeding tube every 4 (four) hours as needed for mild pain (or temp > 37.5 C (99.5 F)). What changed: You were already taking a medication with the same name, and this prescription was added. Make sure you understand how and when to take each.   acetaminophen 650 MG suppository Commonly known as: TYLENOL Place 1 suppository (650 mg total) rectally every 4 (four) hours as needed for mild pain (or temp > 37.5 C (99.5 F)). What changed: You were already taking a medication with the same name, and this prescription was added. Make sure you understand how and when to take each.   allopurinol 100 MG tablet Commonly known as: ZYLOPRIM Take 200 mg by mouth daily.   amLODipine 10 MG tablet Commonly known as: NORVASC Take 1 tablet (10 mg total) by mouth daily. Start taking on: July 02, 2020   ARIPiprazole 10 MG tablet Commonly known as: ABILIFY Take 1 tablet (10 mg total) by mouth daily. Start taking on: July 02, 2020 What changed:  medication strength how much to take   carvedilol 3.125 MG tablet  Commonly known as: COREG Take 1 tablet (3.125 mg total) by mouth 2 (two) times daily with a meal. What changed:  medication strength how much to take   Chlorhexidine Gluconate Cloth 2 % Pads Apply 6 each topically daily.   clonazePAM 0.5 MG tablet Commonly known as: KLONOPIN Take 1 tablet (0.5 mg total) by mouth 2 (two) times daily as needed (anxiety).   hydrALAZINE 100 MG tablet Commonly known as: APRESOLINE Take 1 tablet (100 mg total) by mouth every 8 (eight) hours.    hydrochlorothiazide 12.5 MG capsule Commonly known as: MICROZIDE Take 1 capsule (12.5 mg total) by mouth daily. Start taking on: July 02, 2020 Replaces: hydrochlorothiazide 25 MG tablet   isosorbide mononitrate 30 MG 24 hr tablet Commonly known as: IMDUR Take 1 tablet (30 mg total) by mouth daily. Start taking on: July 02, 2020   levETIRAcetam 750 MG tablet Commonly known as: KEPPRA Take 2 tablets (1,500 mg total) by mouth 2 (two) times daily.   lisinopril 40 MG tablet Commonly known as: ZESTRIL Take 40 mg by mouth daily.   LORazepam 2 MG/ML injection Commonly known as: ATIVAN Inject 0.5 mLs (1 mg total) into the vein every 15 (fifteen) minutes as needed for seizure (Only give if seizure last longer than 85min. Please call neurology if Ativan is given for breakthrough seizures).   pantoprazole 40 MG tablet Commonly known as: PROTONIX Take 1 tablet (40 mg total) by mouth at bedtime.   senna-docusate 8.6-50 MG tablet Commonly known as: Senokot-S Take 1 tablet by mouth 2 (two) times daily.   sertraline 50 MG tablet Commonly known as: ZOLOFT Take 1 tablet (50 mg total) by mouth 2 (two) times daily. What changed:  medication strength how much to take when to take this        LABORATORY STUDIES CBC    Component Value Date/Time   WBC 8.4 06/30/2020 0227   RBC 5.01 06/30/2020 0227   HGB 14.7 06/30/2020 0227   HCT 42.5 06/30/2020 0227   PLT 105 (L) 06/30/2020 0227   MCV 84.8 06/30/2020 0227   MCH 29.3 06/30/2020 0227   MCHC 34.6 06/30/2020 0227   RDW 12.9 06/30/2020 0227   LYMPHSABS 2.8 06/25/2020 0350   MONOABS 0.7 06/25/2020 0350   EOSABS 0.2 06/25/2020 0350   BASOSABS 0.0 06/25/2020 0350   CMP    Component Value Date/Time   NA 135 06/30/2020 0227   K 4.1 06/30/2020 0227   CL 100 06/30/2020 0227   CO2 27 06/30/2020 0227   GLUCOSE 140 (H) 06/30/2020 0227   BUN 10 06/30/2020 0227   CREATININE 1.01 06/30/2020 0227   CALCIUM 9.7 06/30/2020 0227   PROT  7.5 06/25/2020 0350   ALBUMIN 4.7 06/25/2020 0350   AST 35 06/25/2020 0350   ALT 23 06/25/2020 0350   ALKPHOS 51 06/25/2020 0350   BILITOT 1.1 06/25/2020 0350   GFRNONAA >60 06/30/2020 0227   GFRAA 63 (L) 04/28/2013 2006   COAGS Lab Results  Component Value Date   INR 1.0 06/25/2020   INR 1.20 04/28/2013   INR 1.24 06/01/2010   Lipid Panel    Component Value Date/Time   CHOL 141 06/25/2020 0800   TRIG 41 06/25/2020 0800   HDL 57 06/25/2020 0800   CHOLHDL 2.5 06/25/2020 0800   VLDL 8 06/25/2020 0800   LDLCALC 76 06/25/2020 0800   HgbA1C  Lab Results  Component Value Date   HGBA1C 5.6 06/25/2020   Urinalysis    Component Value  Date/Time   COLORURINE YELLOW 05/25/2010 1233   APPEARANCEUR HAZY (A) 05/25/2010 1233   LABSPEC 1.009 05/25/2010 1233   PHURINE 6.0 05/25/2010 1233   GLUCOSEU NEGATIVE 11/11/2008 1354   HGBUR NEGATIVE 05/25/2010 1233   BILIRUBINUR NEGATIVE 05/25/2010 1233   KETONESUR NEGATIVE 05/25/2010 1233   PROTEINUR NEGATIVE 05/25/2010 1233   UROBILINOGEN 0.2 05/25/2010 1233   NITRITE NEGATIVE 05/25/2010 1233   LEUKOCYTESUR  05/25/2010 1233    NEGATIVE MICROSCOPIC NOT DONE ON URINES WITH NEGATIVE PROTEIN, BLOOD, LEUKOCYTES, NITRITE, OR GLUCOSE <1000 mg/dL.   Urine Drug Screen     Component Value Date/Time   LABOPIA NONE DETECTED 06/25/2020 1409   COCAINSCRNUR NONE DETECTED 06/25/2020 1409   LABBENZ NONE DETECTED 06/25/2020 1409   AMPHETMU NONE DETECTED 06/25/2020 1409   THCU POSITIVE (A) 06/25/2020 1409   LABBARB NONE DETECTED 06/25/2020 1409    Alcohol Level    Component Value Date/Time   ETH <10 06/25/2020 0800     SIGNIFICANT DIAGNOSTIC STUDIES CT ANGIO HEAD W OR WO CONTRAST  Result Date: 06/25/2020 CLINICAL DATA:  Follow-up acute intracranial hemorrhage. EXAM: CT ANGIOGRAPHY HEAD AND NECK TECHNIQUE: Multidetector CT imaging of the head and neck was performed using the standard protocol during bolus administration of intravenous contrast.  Multiplanar CT image reconstructions and MIPs were obtained to evaluate the vascular anatomy. Carotid stenosis measurements (when applicable) are obtained utilizing NASCET criteria, using the distal internal carotid diameter as the denominator. CONTRAST:  36mL OMNIPAQUE IOHEXOL 350 MG/ML SOLN COMPARISON:  Prior head CT from earlier the same day. FINDINGS: CT HEAD FINDINGS Brain: No significant interval change in size and morphology of estimated 5 cc intraparenchymal hemorrhage centered at the posterior right temporal lobe. Mildly increased localized vasogenic edema without significant regional mass effect. No other new acute intracranial hemorrhage. No other acute large vessel territory infarct. No mass lesion or midline shift. No hydrocephalus or extra-axial fluid collection. Chronic microvascular ischemic disease again noted. Vascular: No hyperdense vessel. Scattered vascular calcifications noted within the carotid siphons. Skull: Scalp soft tissues and calvarium demonstrate no new finding. Sinuses: Paranasal sinuses remain largely clear. Trace left mastoid effusion noted. Orbits: Globes and orbital soft tissues within normal limits. CTA NECK FINDINGS Aortic arch: Visualized arch normal in caliber with normal 3 vessel morphology. Mild plaque about the arch with no hemodynamically significant stenosis about the origin the great vessels. Right carotid system: Right common and internal carotid arteries widely patent without stenosis, dissection or occlusion. Minimal plaque about the right bifurcation for age. Left carotid system: Left common and internal carotid arteries widely patent without stenosis, dissection or occlusion. Minimal plaque about the left bifurcation for age. Vertebral arteries: Both vertebral arteries arise from the subclavian arteries. No proximal subclavian artery stenosis. Both vertebral arteries widely patent without stenosis, dissection or occlusion. Skeleton: No acute osseous finding. No  discrete or worrisome osseous lesions. Other neck: No other acute soft tissue abnormality within the neck. No mass or adenopathy. Upper chest: Scattered atelectatic changes noted within the visualized lungs. Visualized upper chest demonstrates no other acute finding. Review of the MIP images confirms the above findings CTA HEAD FINDINGS Anterior circulation: Petrous segments widely patent. Mild atheromatous change within the carotid siphons without hemodynamically significant stenosis. A1 segments patent bilaterally. Normal anterior communicating artery complex. Anterior cerebral arteries patent to their distal aspects without stenosis. No M1 stenosis or occlusion. Normal MCA bifurcations. Distal MCA branches well perfused bilaterally. Posterior circulation: Both V4 segments patent to the vertebrobasilar junction, with the left  being slightly dominant. Both PICA origins patent and normal. Basilar diminutive but patent to its distal aspect without stenosis. Superior cerebellar arteries patent bilaterally. Fetal type origin of the PCAs bilaterally. PCAs well perfused to their distal aspects without stenosis. Venous sinuses: Patent. Anatomic variants: None significant. There is an apparent 7 mm blush of contrast at the posterior right temporal lobe immediately superior to the right temporal lobe hemorrhage (series 11, image 86). Finding is favored to reflect a focal venous confluence. No other AVM or other vascular abnormality seen underlying the acute intraparenchymal bleed. Review of the MIP images confirms the above findings IMPRESSION: 1. No significant interval change in size and morphology of estimated 5 cc intraparenchymal hemorrhage centered at the posterior right temporal lobe. Mildly increased localized vasogenic edema without significant regional mass effect. No other new acute intracranial abnormality. 2. 7 mm blush of contrast at the posterior right temporal lobe, immediately adjacent to the right temporal  lobe hemorrhage. While this is favored to reflect a focal venous confluence, confirmation with dedicated catheter directed angiography suggested for confirmatory purposes given its close proximity to the adjacent bleed. 3. Mild atherosclerotic change about the carotid bifurcations and carotid siphons without hemodynamically significant stenosis. 4. Otherwise negative CTA of the head and neck. No large vessel occlusion, hemodynamically significant stenosis, or other acute vascular abnormality. Electronically Signed   By: Jeannine Boga M.D.   On: 06/25/2020 19:47   CT HEAD WO CONTRAST  Result Date: 06/27/2020 CLINICAL DATA:  Intracranial hemorrhage, follow-up examination EXAM: CT HEAD WITHOUT CONTRAST TECHNIQUE: Contiguous axial images were obtained from the base of the skull through the vertex without intravenous contrast. COMPARISON:  06/25/2020 FINDINGS: Brain: Right temporal intraparenchymal hematoma is unchanged measuring 2.7 x 2.8 cm on axial image # 14/3. Mild surrounding edema is stable. Mild mass effect with effacement of the sulci within the posterior temporal lobe and effacement of the occipital horn of the right lateral ventricle is unchanged. Mild periventricular white matter changes are present likely reflecting the sequela of small vessel ischemia. No interval hemorrhage. No extra-axial fluid collection identified. No midline shift. Ventricular size is normal. Cerebellum is unremarkable. Vascular: No asymmetric hyperdense vasculature at the skull base. Skull: Intact Sinuses/Orbits: Mild mucosal thickening within the ethmoid air cells. No air-fluid levels. Remaining paranasal sinuses are clear. Orbits are unremarkable. Other: Mastoid air cells and middle ear cavities are clear. IMPRESSION: Stable 2.8 cm intraparenchymal hematoma within the posterior right temporal lobe with surrounding mild mass effect. No midline shift. No interval hemorrhage. Ventricular size is normal. Electronically Signed    By: Fidela Salisbury MD   On: 06/27/2020 22:46   CT ANGIO NECK W OR WO CONTRAST  Result Date: 06/25/2020 CLINICAL DATA:  Follow-up acute intracranial hemorrhage. EXAM: CT ANGIOGRAPHY HEAD AND NECK TECHNIQUE: Multidetector CT imaging of the head and neck was performed using the standard protocol during bolus administration of intravenous contrast. Multiplanar CT image reconstructions and MIPs were obtained to evaluate the vascular anatomy. Carotid stenosis measurements (when applicable) are obtained utilizing NASCET criteria, using the distal internal carotid diameter as the denominator. CONTRAST:  3mL OMNIPAQUE IOHEXOL 350 MG/ML SOLN COMPARISON:  Prior head CT from earlier the same day. FINDINGS: CT HEAD FINDINGS Brain: No significant interval change in size and morphology of estimated 5 cc intraparenchymal hemorrhage centered at the posterior right temporal lobe. Mildly increased localized vasogenic edema without significant regional mass effect. No other new acute intracranial hemorrhage. No other acute large vessel territory infarct. No mass lesion  or midline shift. No hydrocephalus or extra-axial fluid collection. Chronic microvascular ischemic disease again noted. Vascular: No hyperdense vessel. Scattered vascular calcifications noted within the carotid siphons. Skull: Scalp soft tissues and calvarium demonstrate no new finding. Sinuses: Paranasal sinuses remain largely clear. Trace left mastoid effusion noted. Orbits: Globes and orbital soft tissues within normal limits. CTA NECK FINDINGS Aortic arch: Visualized arch normal in caliber with normal 3 vessel morphology. Mild plaque about the arch with no hemodynamically significant stenosis about the origin the great vessels. Right carotid system: Right common and internal carotid arteries widely patent without stenosis, dissection or occlusion. Minimal plaque about the right bifurcation for age. Left carotid system: Left common and internal carotid arteries  widely patent without stenosis, dissection or occlusion. Minimal plaque about the left bifurcation for age. Vertebral arteries: Both vertebral arteries arise from the subclavian arteries. No proximal subclavian artery stenosis. Both vertebral arteries widely patent without stenosis, dissection or occlusion. Skeleton: No acute osseous finding. No discrete or worrisome osseous lesions. Other neck: No other acute soft tissue abnormality within the neck. No mass or adenopathy. Upper chest: Scattered atelectatic changes noted within the visualized lungs. Visualized upper chest demonstrates no other acute finding. Review of the MIP images confirms the above findings CTA HEAD FINDINGS Anterior circulation: Petrous segments widely patent. Mild atheromatous change within the carotid siphons without hemodynamically significant stenosis. A1 segments patent bilaterally. Normal anterior communicating artery complex. Anterior cerebral arteries patent to their distal aspects without stenosis. No M1 stenosis or occlusion. Normal MCA bifurcations. Distal MCA branches well perfused bilaterally. Posterior circulation: Both V4 segments patent to the vertebrobasilar junction, with the left being slightly dominant. Both PICA origins patent and normal. Basilar diminutive but patent to its distal aspect without stenosis. Superior cerebellar arteries patent bilaterally. Fetal type origin of the PCAs bilaterally. PCAs well perfused to their distal aspects without stenosis. Venous sinuses: Patent. Anatomic variants: None significant. There is an apparent 7 mm blush of contrast at the posterior right temporal lobe immediately superior to the right temporal lobe hemorrhage (series 11, image 86). Finding is favored to reflect a focal venous confluence. No other AVM or other vascular abnormality seen underlying the acute intraparenchymal bleed. Review of the MIP images confirms the above findings IMPRESSION: 1. No significant interval change in  size and morphology of estimated 5 cc intraparenchymal hemorrhage centered at the posterior right temporal lobe. Mildly increased localized vasogenic edema without significant regional mass effect. No other new acute intracranial abnormality. 2. 7 mm blush of contrast at the posterior right temporal lobe, immediately adjacent to the right temporal lobe hemorrhage. While this is favored to reflect a focal venous confluence, confirmation with dedicated catheter directed angiography suggested for confirmatory purposes given its close proximity to the adjacent bleed. 3. Mild atherosclerotic change about the carotid bifurcations and carotid siphons without hemodynamically significant stenosis. 4. Otherwise negative CTA of the head and neck. No large vessel occlusion, hemodynamically significant stenosis, or other acute vascular abnormality. Electronically Signed   By: Jeannine Boga M.D.   On: 06/25/2020 19:47   MR BRAIN W WO CONTRAST  Result Date: 06/26/2020 CLINICAL DATA:  Cerebral hemorrhage suspected. EXAM: MRI HEAD WITHOUT AND WITH CONTRAST TECHNIQUE: Multiplanar, multiecho pulse sequences of the brain and surrounding structures were obtained without and with intravenous contrast. CONTRAST:  33mL GADAVIST GADOBUTROL 1 MMOL/ML IV SOLN COMPARISON:  Head CT Aug 25, 2020 FINDINGS: The study is partially degraded by motion. Brain: No significant interval change in size of the posterior  right temporal lobe intraparenchymal hematoma measuring approximately 2.9 x 2.6 x 1.4 cm with mild surrounding vasogenic edema. There is effacement of the adjacent cerebral sulci without significant mass effect or midline shift. Prominent vessels are seen in the superior aspect of the hematoma (series 19, image 33) which may represent displaced leptomeningeal vessels although the possibility of a micro AVM cannot be entirely excluded. No focus of abnormal contrast enhancement identified. No acute infarct, hydrocephalus or  extra-axial collection. Scattered and confluent foci of T2 hyperintensity are seen within the white matter of the cerebral hemispheres, nonspecific, most likely related to chronic microangiopathic changes. Vascular: Normal flow voids. Skull and upper cervical spine: Normal marrow signal. Sinuses/Orbits: Negative. IMPRESSION: 1. No significant interval change in size of the posterior right temporal lobe intraparenchymal hematoma measuring approximately 2.9 x 2.6 x 1.4 cm with mild surrounding vasogenic edema. No focus of abnormal contrast enhancement identified. 2. Prominent vessels in the superior aspect of the hematoma may represent displaced leptomeningeal vessels although the possibility of a micro AVM cannot be entirely excluded. Electronically Signed   By: Pedro Earls M.D.   On: 06/26/2020 08:37   EEG adult  Result Date: 06/26/2020 Lora Havens, MD     06/26/2020  4:57 PM Patient Name: Mathew Malone MRN: [id number] Epilepsy Attending: Lora Havens Referring Physician/Provider: Dr Amie [place] Date: 06/26/2020 Duration: 24.26 mins Patient history: 74yoM with right ICH. EEG to evaluate for seizure Level of alertness: Awake, asleep AEDs during EEG study: LEV Technical aspects: This EEG study was done with scalp electrodes positioned according to the 10-20 International system of electrode placement. Electrical activity was acquired at a sampling rate of 500Hz  and reviewed with a high frequency filter of 70Hz  and a low frequency filter of 1Hz . EEG data were recorded continuously and digitally stored. Description: The posterior dominant rhythm consists of 8-9 Hz activity of moderate voltage (25-35 uV) seen predominantly in posterior head regions, symmetric and reactive to eye opening and eye closing. Sleep was characterized by vertex waves, sleep spindles (12 to 14 Hz), maximal frontocentral region.  EEG showed intermittent right temporal 3 to 6 Hz theta-delta slowing. Sharp waves were also  seen in right posterior temporal region. Hyperventilation and photic stimulation were not performed.   ABNORMALITY -Intermittent slow, right temporal region -Sharp wave, right posterior temporal region. IMPRESSION: This study showed evidence of potential epileptogenicity arising from right posterior temporal region as well as cortical dysfunction in the right temporal region likely secondary to underlying bleed. No seizures were seen throughout the recording. Priyanka [name]   Overnight EEG with video  Result Date: 06/27/2020 Lora Havens, MD     06/27/2020 12:10 PM Patient Name: Kyi Romanello MRN: [id number] Epilepsy Attending: Lora Havens Referring Physician/Provider: Doreatha Massed, NP Duration: 06/26/2020 1540 to 06/27/2020 1017   Patient history: 65yoM with right ICH. EEG to evaluate for seizure   Level of alertness: Awake, asleep   AEDs during EEG study: LEV   Technical aspects: This EEG study was done with scalp electrodes positioned according to the 10-20 International system of electrode placement. Electrical activity was acquired at a sampling rate of 500Hz  and reviewed with a high frequency filter of 70Hz  and a low frequency filter of 1Hz . EEG data were recorded continuously and digitally stored.   Description: The posterior dominant rhythm consists of 8-9 Hz activity of moderate voltage (25-35 uV) seen predominantly in posterior head regions, symmetric and reactive to eye opening and eye closing. Sleep  was characterized by vertex waves, sleep spindles (12 to 14 Hz), maximal frontocentral region.  EEG showed intermittent right temporal 3 to 6 Hz theta-delta slowing. Sharp waves were also seen in right posterior temporal region. Hyperventilation and photic stimulation were not performed.     ABNORMALITY -Intermittent slow, right temporal region -Sharp wave, right posterior temporal region.   IMPRESSION: This study showed evidence of potential epileptogenicity arising from right posterior  temporal region as well as cortical dysfunction in the right temporal region likely secondary to underlying bleed. No seizures were seen throughout the recording.   Lora Havens   ECHOCARDIOGRAM COMPLETE  Result Date: 06/25/2020    ECHOCARDIOGRAM REPORT   Patient Name:   HARALAMBOS Ponti Date of Exam: 06/25/2020 Medical Rec #:  [id number]   Height:       70.0 in Accession #:    [id number]  Weight:       216.3 lb Date of Birth:  Jan 29, 1946  BSA:          2.158 m Patient Age:    74 years    BP:           154/86 mmHg Patient Gender: M           HR:           56 bpm. Exam Location:  Inpatient Procedure: 2D Echo, Cardiac Doppler and Color Doppler Indications:    Stroke  History:        Patient has no prior history of Echocardiogram examinations.                 Signs/Symptoms:Altered Mental Status.  Sonographer:    Merrie Roof RDCS Referring Phys: [id number] [place]  1. Left ventricular ejection fraction, by estimation, is 50 to 55%. The left ventricle has low normal function. The left ventricle has no regional wall motion abnormalities. Left ventricular diastolic parameters are indeterminate.  2. Right ventricular systolic function is normal. The right ventricular size is normal.  3. Left atrial size was moderately dilated.  4. Right atrial size was mildly dilated.  5. The mitral valve is grossly normal. No evidence of mitral valve regurgitation. No evidence of mitral stenosis.  6. The aortic valve is grossly normal. There is mild calcification of the aortic valve. Aortic valve regurgitation is not visualized. Mild aortic valve sclerosis is present, with no evidence of aortic valve stenosis.  7. The inferior vena cava is normal in size with greater than 50% respiratory variability, suggesting right atrial pressure of 3 mmHg. Comparison(s): No prior Echocardiogram. Conclusion(s)/Recommendation(s): Normal biventricular function without evidence of hemodynamically significant valvular heart disease.  FINDINGS  Left Ventricle: Left ventricular ejection fraction, by estimation, is 50 to 55%. The left ventricle has low normal function. The left ventricle has no regional wall motion abnormalities. The left ventricular internal cavity size was normal in size. There is no left ventricular hypertrophy. Left ventricular diastolic parameters are indeterminate. Right Ventricle: The right ventricular size is normal. Right vetricular wall thickness was not well visualized. Right ventricular systolic function is normal. Left Atrium: Left atrial size was moderately dilated. Right Atrium: Right atrial size was mildly dilated. Pericardium: There is no evidence of pericardial effusion. Mitral Valve: The mitral valve is grossly normal. No evidence of mitral valve regurgitation. No evidence of mitral valve stenosis. Tricuspid Valve: The tricuspid valve is grossly normal. Tricuspid valve regurgitation is not demonstrated. No evidence of tricuspid stenosis. Aortic Valve: The aortic valve is grossly normal. There is  mild calcification of the aortic valve. Aortic valve regurgitation is not visualized. Mild aortic valve sclerosis is present, with no evidence of aortic valve stenosis. Aortic valve mean gradient  measures 5.0 mmHg. Aortic valve peak gradient measures 7.2 mmHg. Aortic valve area, by VTI measures 3.11 cm. Pulmonic Valve: The pulmonic valve was not well visualized. Pulmonic valve regurgitation is not visualized. Aorta: The aortic root, ascending aorta and aortic arch are all structurally normal, with no evidence of dilitation or obstruction. Venous: The inferior vena cava is normal in size with greater than 50% respiratory variability, suggesting right atrial pressure of 3 mmHg. IAS/Shunts: The atrial septum is grossly normal.  LEFT VENTRICLE PLAX 2D LVIDd:         5.00 cm  Diastology LVIDs:         3.70 cm  LV e' medial:    5.55 cm/s LV PW:         0.80 cm  LV E/e' medial:  9.2 LV IVS:        1.00 cm  LV e' lateral:    7.72 cm/s LVOT diam:     2.10 cm  LV E/e' lateral: 6.6 LV SV:         86 LV SV Index:   40 LVOT Area:     3.46 cm  RIGHT VENTRICLE          IVC RV Basal diam:  3.30 cm  IVC diam: 1.50 cm LEFT ATRIUM             Index       RIGHT ATRIUM           Index LA diam:        4.50 cm 2.09 cm/m  RA Area:     18.20 cm LA Vol (A2C):   81.4 ml 37.72 ml/m RA Volume:   48.90 ml  22.66 ml/m LA Vol (A4C):   88.4 ml 40.97 ml/m LA Biplane Vol: 88.1 ml 40.83 ml/m  AORTIC VALVE AV Area (Vmax):    3.26 cm AV Area (Vmean):   2.53 cm AV Area (VTI):     3.11 cm AV Vmax:           134.00 cm/s AV Vmean:          103.000 cm/s AV VTI:            0.275 m AV Peak Grad:      7.2 mmHg AV Mean Grad:      5.0 mmHg LVOT Vmax:         126.00 cm/s LVOT Vmean:        75.200 cm/s LVOT VTI:          0.247 m LVOT/AV VTI ratio: 0.90  AORTA Ao Root diam: 3.50 cm Ao Asc diam:  3.30 cm MITRAL VALVE MV Area (PHT): 2.45 cm    SHUNTS MV Decel Time: 310 msec    Systemic VTI:  0.25 m MV E velocity: 51.00 cm/s  Systemic Diam: 2.10 cm MV A velocity: 74.60 cm/s MV E/A ratio:  0.68 Buford Dresser MD Electronically signed by Buford Dresser MD Signature Date/Time: 06/25/2020/4:28:31 PM    Final    CT HEAD CODE STROKE WO CONTRAST  Result Date: 06/25/2020 CLINICAL DATA:  Code stroke. Initial evaluation for acute left-sided weakness. EXAM: CT HEAD WITHOUT CONTRAST TECHNIQUE: Contiguous axial images were obtained from the base of the skull through the vertex without intravenous contrast. COMPARISON:  None. FINDINGS: Brain: Examination degraded by motion artifact.  2.9 x 2.7 x 1.3 cm acute intraparenchymal hemorrhage seen positioned at the posterior right temporal lobe (estimated volume 5 cc). Mild surrounding vasogenic edema without significant regional mass effect. No other visible acute intracranial hemorrhage. No other acute large vessel territory infarct. No mass lesion or midline shift. No hydrocephalus or extra-axial fluid collection. Underlying  moderate to advanced chronic microvascular ischemic disease. Vascular: Calcified atherosclerosis at the skull base. No asymmetric hyperdense vessel. Skull: No scalp soft tissue abnormality.  Calvarium intact. Sinuses/Orbits: Globes and orbital soft tissues within normal limits. Paranasal sinuses are clear. Trace left mastoid effusion noted, of doubtful significance. Other: None. ASPECTS [facility] Stroke Program Early CT Score) Does not apply given acute intracranial hemorrhage. IMPRESSION: 1. 2.9 x 2.7 x 1.3 cm acute intraparenchymal hemorrhage centered at the posterior right temporal lobe (estimated volume 5 cc). Mild localized edema without significant regional mass effect. 2. Underlying moderate to advanced chronic microvascular ischemic disease. Critical Value/emergent results were called by telephone at the time of interpretation on 06/25/2020 at 3:50 am to provider Dr. Theda Sers, who verbally acknowledged these results. Electronically Signed   By: Jeannine Boga M.D.   On: 06/25/2020 03:53   IR ANGIO INTRA EXTRACRAN SEL COM CAROTID INNOMINATE BILAT MOD SED  Result Date: 06/27/2020 CLINICAL DATA:  Right temporal intracranial hemorrhage EXAM: BILATERAL COMMON CAROTID AND INNOMINATE ANGIOGRAPHY COMPARISON:  CT angiogram of the head and neck of June 25, 2020. MEDICATIONS: Heparin none units IV. No antibiotic was administered within 1 hour of the procedure. ANESTHESIA/SEDATION: Precedex IV CONTRAST:  Isovue 300 approximately 65 mL FLUOROSCOPY TIME:  Fluoroscopy Time: 10 minutes 18 seconds (957 mGy). COMPLICATIONS: None immediate. TECHNIQUE: Informed written consent was obtained from the patient after a thorough discussion of the procedural risks, benefits and alternatives. All questions were addressed. Maximal Sterile Barrier Technique was utilized including caps, mask, sterile gowns, sterile gloves, sterile drape, hand hygiene and skin antiseptic. A timeout was performed prior to the initiation of the  procedure. The right groin was prepped and draped in the usual sterile fashion. Thereafter using modified Seldinger technique, transfemoral access into the right common femoral artery was obtained without difficulty. Over a 0.035 inch guidewire, a 5 French Pinnacle sheath was inserted. Through this, and also over 0.035 inch guidewire, a 5 [place] JB 1 catheter was advanced to the aortic arch region and selectively positioned in the right common carotid artery, right subclavian artery, the left common carotid artery and the left subclavian artery. FINDINGS: The right common carotid arteriogram demonstrates moderate tortuosity of the proximal right common carotid artery. The right common carotid bifurcation demonstrates the right external carotid artery and its major branches to be widely patent. The right internal carotid artery at the bulb to the cranial skull base is widely patent. The petrous, the cavernous and the supraclinoid segments are widely patent. The right posterior communicating artery is seen opacifying the right posterior cerebral artery and superior cerebellar artery distributions. The right middle cerebral artery demonstrates mild fusiform dilatation at the distal right M1 segment. The trifurcation branches, otherwise, opacify normally into the capillary and venous phases. The right anterior cerebral artery is seen to opacify normally into the capillary and venous phases. The right vertebral artery origin is widely patent. The vessel is seen to advanced to the occipital region where it supplies the right posterior-inferior cerebellar artery. Focal areas of caliber narrowing are seen involving the proximal 1/3 of the right posterior-inferior cerebellar artery. No contrast is seen the right vertebrobasilar junction distal to the  posterior-inferior cerebellar artery. The left subclavian arteriogram demonstrates the origin of the left vertebral artery to be at the level of the left thyrocervical trunk.  The vertebral artery is seen to opacify to the cranial skull base. Patency is seen of the right vertebrobasilar junction with a prominent left posterior-inferior cerebellar artery. Distal to this opacification of the basilar artery is noted with opacification of the left superior cerebellar artery. The left common carotid arteriogram demonstrates the left external carotid artery and its major branches to be widely patent. The left internal carotid artery at the bulb to the cranial skull base is widely patent. The petrous, cavernous and the supraclinoid segments are widely patent. The left middle cerebral artery and the left anterior cerebral artery opacify into the capillary and venous phases. Small focal areas of caliber irregularity involving A2 and A3 segment of the of the left pericallosal artery suggest intracranial arteriosclerosis. Noted is a dominant left posterior communicating artery opacifying the left posterior cerebral artery and left superior cerebellar artery distributions. 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 Otherwise, no evidence of extracranial or intracranial dissections, occlusions or stenosis, dural AV fistula, AV shunting or of intraluminal filling defects. Venous outflow within normal limits. PLAN: Follow-up of diagnostic catheter arteriogram in 4 to 6 weeks time. Electronically Signed   By: Luanne Bras M.D.   On: 06/26/2020 13:18   IR ANGIO VERTEBRAL SEL SUBCLAVIAN INNOMINATE BILAT MOD SED  Result Date: 06/27/2020 CLINICAL DATA:  Right temporal intracranial hemorrhage EXAM: BILATERAL COMMON CAROTID AND INNOMINATE ANGIOGRAPHY COMPARISON:  CT angiogram of the head and neck of June 25, 2020. MEDICATIONS: Heparin none units IV. No antibiotic was  administered within 1 hour of the procedure. ANESTHESIA/SEDATION: Precedex IV CONTRAST:  Isovue 300 approximately 65 mL FLUOROSCOPY TIME:  Fluoroscopy Time: 10 minutes 18 seconds (957 mGy). COMPLICATIONS: None immediate. TECHNIQUE: Informed written consent was obtained from the patient after a thorough discussion of the procedural risks, benefits and alternatives. All questions were addressed. Maximal Sterile Barrier Technique was utilized including caps, mask, sterile gowns, sterile gloves, sterile drape, hand hygiene and skin antiseptic. A timeout was performed prior to the initiation of the procedure. The right groin was prepped and draped in the usual sterile fashion. Thereafter using modified Seldinger technique, transfemoral access into the right common femoral artery was obtained without difficulty. Over a 0.035 inch guidewire, a 5 French Pinnacle sheath was inserted. Through this, and also over 0.035 inch guidewire, a 5 [place] JB 1 catheter was advanced to the aortic arch region and selectively positioned in the right common carotid artery, right subclavian artery, the left common carotid artery and the left subclavian artery. FINDINGS: The right common carotid arteriogram demonstrates moderate tortuosity of the proximal right common carotid artery. The right common carotid bifurcation demonstrates the right external carotid artery and its major branches to be widely patent. The right internal carotid artery at the bulb to the cranial skull base is widely patent. The petrous, the cavernous and the supraclinoid segments are widely patent. The right posterior communicating artery is seen opacifying the right posterior cerebral artery and superior cerebellar artery distributions. The right middle cerebral artery demonstrates mild fusiform dilatation at the distal right M1 segment. The trifurcation branches, otherwise, opacify normally into the capillary and venous phases. The right anterior  cerebral artery is  seen to opacify normally into the capillary and venous phases. The right vertebral artery origin is widely patent. The vessel is seen to advanced to the occipital region where it supplies the right posterior-inferior cerebellar artery. Focal areas of caliber narrowing are seen involving the proximal 1/3 of the right posterior-inferior cerebellar artery. No contrast is seen the right vertebrobasilar junction distal to the posterior-inferior cerebellar artery. The left subclavian arteriogram demonstrates the origin of the left vertebral artery to be at the level of the left thyrocervical trunk. The vertebral artery is seen to opacify to the cranial skull base. Patency is seen of the right vertebrobasilar junction with a prominent left posterior-inferior cerebellar artery. Distal to this opacification of the basilar artery is noted with opacification of the left superior cerebellar artery. The left common carotid arteriogram demonstrates the left external carotid artery and its major branches to be widely patent. The left internal carotid artery at the bulb to the cranial skull base is widely patent. The petrous, cavernous and the supraclinoid segments are widely patent. The left middle cerebral artery and the left anterior cerebral artery opacify into the capillary and venous phases. Small focal areas of caliber irregularity involving A2 and A3 segment of the of the left pericallosal artery suggest intracranial arteriosclerosis. Noted is a dominant left posterior communicating artery opacifying the left posterior cerebral artery and left superior cerebellar artery distributions. 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 Otherwise, no evidence of extracranial or intracranial  dissections, occlusions or stenosis, dural AV fistula, AV shunting or of intraluminal filling defects. Venous outflow within normal limits. PLAN: Follow-up of diagnostic catheter arteriogram in 4 to 6 weeks time. Electronically Signed   By: Luanne Bras M.D.   On: 06/26/2020 13:18      HISTORY OF PRESENT ILLNESS Mr. Dameon Soltis is a 75 y.o. male with history of HTN, HLD presenting with new onset seizures and AMS after collapsing at home. EMS activated and noted R gaze, intially flaccid left extremities then started moving left lower extremity. He was also noted to be incontinent of bowel. He was transported to the ED where stat [facility] showed acute posterior right temporal ICH. He was admitted for further evaluation, work up and care.   HOSPITAL COURSE  Mr. Couper continued stroke work up showed the following: MRI brain does not have the typical appearance of cerebral amyloid angiopathy. There was concern for micro-AVM but diagnostic cerebral angiogram done by Dr. Estanislado Pandy on 3/7  reports no AVM aneurysm or dural AV fistula. CT head right temporal lobe ICH about 5cc CTA head & neck stable hematoma, 7 mm blush of contrast at the posterior right temporal lobe, immediately adjacent to the right temporal lobe hemorrhage Cerebral angiogram done (06/26/2020): no AVM aneurysm or dural AV fistula reported-CT angio rules out AVM. Repeat CT 06/27/2020 with stable 2.8 cm IPH within the posterior right temporal lobe with surrounding mild mass-effect.  No midline shift. LDL 76 HgbA1c 5.6 2D echo: EF 50-55%, moderate biatrial dilation  He required cleviprex drip to control severe hypertension upon admission. Grand mal seizure occurred in the ED. He was  given Ativan and loaded with Keppra and VPA with resolution. He transferred to the neurologic ICU. He required restraints and sedation for agitation. The critical care medicine  team was consulted and assisted with managing patient's critical illness. Precedex drip  improved agitation however patient became bradycardic which resolved with discontinuation of precedex. EEG was obtained and showed epileptogenicity as below.  No further seizures during his stay. Repeat head CT was stable on 3/8. Ongoing titration for improved blood pressure control.   With time he made progress and improved to an alert and oriented state. Able to tolerate a regular diet. Physical, occupational and speech therapists evaluated patient and recommended inpatient rehabilitation. He was accepted at [facility] and was transferred there. Issues are presented below in a problem based format:   Stroke management:  ASA 81mg  prior to admission, now no AP/AC d/t bleed NO antiplatelets or anticoagulants Therapy recommendations:  CIR Disposition:  CIR, [facility] Follow up with [facility], Dr. Leonie Man 4 weeks from discharge from rehab facility. Appt requested.    Seizure, [facility], new onset Witnessed seizure in ED Secondary to temporal ICH Loaded with 3mg  Ativan, 4g Keppra and 3g VPA in ER (3/7) Continue 1500mg  Keppra BID Significant post-ictal agitation now resolved EEG: potential epileptogenicity arising from right posterior temporal region as well as cortical dysfunction in the right temporal region likely secondary to underlying bleed. No seizures were seen throughout the recording.     Hypertension/hypertensive emergency-hypertensive emergency resolved.  Continuing to work on optimizing blood pressures. Home meds:  Maxzide, Coreg, Zestril, Hydrodiuril-unclear compliance-suspect non-compliance to meds. [facility] records not available. On lisinopril 40.  Amlodipine 10.  Carvedilol 3.125 which has been held few times due to bradycardia.  Hydrochlorothiazide 12.5.  Hydralazine 50 p.o. 3 times daily was added yesterday.  Can increase to hydralazine 100 3 times daily. SBP goal less than 160.  Eventually will require normotension.   Sinus Bradycardia Worsened while on high  dose precedex and better after discontinuation Appears asymptomatic at this time   Hyperlipidemia Home meds:  Lovaza, Q10 LDL 76, goal < 70 Consider statin at discharge    Dysphagia  Passed bedside swallow eval Speech on board  The patient is asked to make an attempt to improve diet and exercise patterns to aid in medical management of this problem.  PTSD, history Sertraline 50 mg bid Abilify 10 mg daily Clonazepam 0.5mg  bid prn   Thrombocytopenia Platelet count 125 prior admission Platelet count 145->102->95 ->92-->98-->105 (closer to baseline now) No evidence of active bleeding.  No indication for transfusion at this time.   Other Stroke Risk Factors Advanced Age >/= 65  OSA on CPAP at home Previous Cigarette smoker Obesity, Body mass index is 31.03 kg/m., BMI >/= 30 associated with increased stroke risk, recommend weight loss, diet and exercise as appropriate    Other Active Problems PTSD- high risk for delirium. Should resume mood/SSRi meds when able Prostate Cancer-MRI brain negative for any underlying metastatic lesion at this time. Chronic urinary retention- resume oxybutynin when able Grief: Recently lost his wife. He has been having difficulty managing alone. He is visiting his two daughters that live in [place] from [place], [place] [facility] to [facility]-orders placed in the chart-pending chart availability.   DISCHARGE EXAM Blood pressure (!) 156/100, pulse 65, temperature 98.1 F (36.7 C), temperature source Oral, resp. rate 18, height 5\' 10"  (1.778 m), weight 98.1 kg, SpO2 94 %. Per Dr. Clydene Fake exam today:   General: Pleasant elderly African-American male, sitting comfortably  in bed.  Not in distress HEENT: Normocephalic, improving left lateral tongue laceration from his tongue bite CVS: Heart rate  improved regular in the 60s. Extremities warm well perfused Neurological exam Awake alert oriented x3 No gross dysarthria No  aphasia Cranial nerves II to XII intact Motor exam with no drift in any of the 4 extremities-5/5 all over. Sensation intact light touch all over Coordination: Finger-nose-finger testing intact with no dysmetria Gait testing deferred at this time   Discharge Diet      Diet   Diet heart healthy/carb modified Room service appropriate? Yes; Fluid consistency: Thin   liquids  DISCHARGE PLAN Disposition:  Transfer to [place] for ongoing PT, OT and ST NO anticoagulants or antiplatelet medications until further notice Recommend ongoing stroke risk factor control by Primary Care Physician at time of discharge from inpatient rehabilitation. Follow-up PCP Avva, Ravisankar, MD in 2 weeks following discharge from rehab. Follow-up in [facility] in 4 weeks following discharge from rehab, office to schedule an appointment.   45 minutes were spent preparing discharge.  Delila A Bailey-Modzik, NP-C  I have personally obtained history,examined this patient, reviewed notes, independently viewed imaging studies, participated in medical decision making and plan of care.ROS completed by me personally and pertinent positives fully documented  I have made any additions or clarifications directly to the above note. Agree with note above.    Antony Contras, MD Medical Director [facility] Pager: [phone] 07/03/2020 5:02 PM

## 2020-07-01 NOTE — Care Management (Signed)
Spoke with Mathew Malone with [facility]. States family has address to facility. Made Mathew Malone aware that dc summary is in the system. Mathew Malone states there is no need to send prescriptions with patient as [facility].   Family will transport patient to [place]. Spoke with nursing. Report has already been given. [facility] is expecting Mathew Malone.  No further needs assessed.    [name], MSN, RN,BSN Inpatient [facility] Case Manager [phone]

## 2020-07-01 NOTE — TOC Transition Note (Signed)
Transition of Care [facility]) - CM/SW Discharge Note   Patient Details  Name: Day Deery MRN: [id number] Date of Birth: [birth year]  Transition of Care [facility]) CM/SW Contact:  Konrad Penta, RN Phone Number: [phone]  07/01/2020, 1:52 PM   Clinical Narrative:   Confirmed with Jace with Inpatient Rehab at [facility] that Mr. Hermiz can transfer to their unit today. Jace states he is awaiting the dc summary and requests that writer give him an idea of timing of Mr. Seng's arrival to [place]. Family will transport patient to [facility] inpatient rehab unit.  Spoke with patient's nurse to make aware telephone number to call for report is [phone]. Accepting physician is Dr. Leandro Reasoner.    Final next level of care: [facility] inpatient rehab) Barriers to Discharge: No Barriers Identified   Patient Goals and CMS Choice Patient states their goals for this hospitalization and ongoing recovery are:: to transfer to inpatient rehab at [email or web address] Compare Post Acute Care list provided to:: Patient Choice offered to / list presented to : [place]  Discharge Placement                       Discharge Plan and Services In-house Referral: Clinical Social Work Discharge Planning Services: CM Consult Post Acute Care Choice: IP Rehab          DME Arranged: N/A DME Agency: NA       HH Arranged: NA HH Agency: NA        Social Determinants of Health (SDOH) Interventions     Readmission Risk Interventions No flowsheet data found.   [name], MSN, RN,BSN Inpatient [facility] Case Manager [phone]

## 2020-07-01 NOTE — Progress Notes (Signed)
Ambulated patient in hall with one assist and a front wheel walker. Three laps completed. Pt. Tolerated well and reported no side effects. VSS.

## 2020-07-01 NOTE — Progress Notes (Signed)
Called report to Dory Larsen, RN at [facility] inpatient rehab

## 2020-07-01 NOTE — Progress Notes (Signed)
STROKE TEAM PROGRESS NOTE   INTERVAL HISTORY Patient is sitting up comfortably in bed.  He wants to go home.  He has no complaints.   No family at bedside. No agitation overnight.  Vital signs stable. Blood pressure adequately controlled. Will transfer to floor when bed available Family wants him for CIR at [facility].  Rehab aware.   Vitals:   07/01/20 1000 07/01/20 1033 07/01/20 1034 07/01/20 1100  BP: (!) 147/104 (!) 147/104 (!) 147/104 (!) 156/100  Pulse: 62   65  Resp: (!) 25   18  Temp:      TempSrc:      SpO2: (!) 84%   94%  Weight:      Height:       CBC:  Recent Labs  Lab 06/25/20 0350 06/26/20 0217 06/29/20 0156 06/30/20 0227  WBC 14.1*   < > 8.0 8.4  NEUTROABS 10.3*  --   --   --   HGB 17.0   < > 15.6 14.7  HCT 52.9*   < > 44.5 42.5  MCV 89.5   < > 83.6 84.8  PLT 145*   < > 98* 105*   < > = values in this interval not displayed.   Basic Metabolic Panel:  Recent Labs  Lab 06/25/20 0800 06/26/20 0217 06/28/20 0020 06/29/20 0156 06/29/20 1823 06/30/20 0227  NA  --    < > 135 135  --  135  K  --    < > 2.8* 3.1* 4.0 4.1  CL  --    < > 101 99  --  100  CO2  --    < > 25 28  --  27  GLUCOSE  --    < > 114* 125*  --  140*  BUN  --    < > 7* 5*  --  10  CREATININE  --    < > 0.74 0.74  --  1.01  CALCIUM  --    < > 8.8* 9.7  --  9.7  MG 1.9   < > 1.6*  --   --  2.1  PHOS 2.5  --   --   --   --   --    < > = values in this interval not displayed.   Lipid Panel:  Recent Labs  Lab 06/25/20 0800  CHOL 141  TRIG 41  HDL 57  CHOLHDL 2.5  VLDL 8  LDLCALC 76   HgbA1c:  Recent Labs  Lab 06/25/20 0800  HGBA1C 5.6   Urine Drug Screen:  Recent Labs  Lab 06/25/20 1409  LABOPIA NONE DETECTED  COCAINSCRNUR NONE DETECTED  LABBENZ NONE DETECTED  AMPHETMU NONE DETECTED  THCU POSITIVE*  LABBARB NONE DETECTED    Alcohol Level  Recent Labs  Lab 06/25/20 0800  ETH <10   IMAGING CT head from 06/27/2020 Stable 2.8 cm intraparenchymal hematoma within  the posterior right temporal lobe with surrounding mild mass effect. No midline shift. No interval hemorrhage. Ventricular size is normal.  MRI brain with and without contrast03/10/2020 IMPRESSION: 1. No significant interval change in size of the posterior right temporal lobe intraparenchymal hematoma measuring approximately 2.9 x 2.6 x 1.4 cm with mild surrounding vasogenic edema. No focus of abnormal contrast enhancement identified. 2. Prominent vessels in the superior aspect of the hematoma may represent displaced leptomeningeal vessels although the possibility of a micro AVM cannot be entirely excluded.  EEG adult Result Date: 06/26/2020 IMPRESSION: This study showed  evidence of potential epileptogenicity arising from right posterior temporal region as well as cortical dysfunction in the right temporal region likely secondary to underlying bleed. No seizures were seen throughout the recording. Mathew Malone   PHYSICAL EXAM Vitals:   07/01/20 1034 07/01/20 1100  BP: (!) 147/104 (!) 156/100  Pulse:  65  Resp:  18  Temp:    SpO2:  94%  General: Pleasant elderly African-American male, sitting comfortably  in bed.  Not in distress HEENT: Normocephalic, improving left lateral tongue laceration from his tongue bite CVS: Heart rate improved regular in the 60s. Extremities warm well perfused Neurological exam Awake alert oriented x3 No gross dysarthria No aphasia Cranial nerves II to XII intact Motor exam with no drift in any of the 4 extremities-5/5 all over. Sensation intact light touch all over Coordination: Finger-nose-finger testing intact with no dysmetria Gait testing deferred at this time Unchanged from yesterday  Current Facility-Administered Medications:  .  acetaminophen (TYLENOL) tablet 650 mg, 650 mg, Oral, Q4H PRN **OR** acetaminophen (TYLENOL) 160 MG/5ML solution 650 mg, 650 mg, Per Tube, Q4H PRN **OR** acetaminophen (TYLENOL) suppository 650 mg, 650 mg, Rectal,  Q4H PRN, Gwinda Maine, MD .  amLODipine (NORVASC) tablet 10 mg, 10 mg, Oral, Daily, Amie Portland, MD, 10 mg at 07/01/20 1033 .  ARIPiprazole (ABILIFY) tablet 10 mg, 10 mg, Oral, Daily, Amie Portland, MD, 10 mg at 07/01/20 1037 .  carvedilol (COREG) tablet 3.125 mg, 3.125 mg, Oral, BID WC, Amie Portland, MD, 3.125 mg at 07/01/20 0826 .  Chlorhexidine Gluconate Cloth 2 % PADS 6 each, 6 each, Topical, Daily, Rosalin Hawking, MD, 6 each at 07/01/20 0600 .  clonazePAM (KLONOPIN) tablet 0.5 mg, 0.5 mg, Oral, BID PRN, Jennelle Human B, NP .  hydrALAZINE (APRESOLINE) injection 10 mg, 10 mg, Intravenous, Q4H PRN, Jennelle Human B, NP, 10 mg at 06/29/20 1724 .  hydrALAZINE (APRESOLINE) tablet 100 mg, 100 mg, Oral, Q8H, Amie Portland, MD, 100 mg at 07/01/20 0537 .  hydrochlorothiazide (MICROZIDE) capsule 12.5 mg, 12.5 mg, Oral, Daily, Amie Portland, MD, 12.5 mg at 07/01/20 1033 .  isosorbide mononitrate (IMDUR) 24 hr tablet 30 mg, 30 mg, Oral, Daily, Jennelle Human B, NP, 30 mg at 07/01/20 1034 .  levETIRAcetam (KEPPRA) tablet 1,500 mg, 1,500 mg, Oral, BID, Olivencia-Simmons, Ivelisse, NP, 1,500 mg at 07/01/20 1034 .  lisinopril (ZESTRIL) tablet 40 mg, 40 mg, Oral, Daily, Olivencia-Simmons, Ivelisse, NP, 40 mg at 07/01/20 1034 .  LORazepam (ATIVAN) injection 1 mg, 1 mg, Intravenous, Q15 min PRN, Metzger-Cihelka, Desiree, NP .  pantoprazole (PROTONIX) EC tablet 40 mg, 40 mg, Oral, QHS, Amie Portland, MD, 40 mg at 06/30/20 2227 .  senna-docusate (Senokot-S) tablet 1 tablet, 1 tablet, Oral, BID, Gwinda Maine, MD, 1 tablet at 07/01/20 1034 .  sertraline (ZOLOFT) tablet 50 mg, 50 mg, Oral, BID, Amie Portland, MD, 50 mg at 07/01/20 0826 .  sodium chloride flush (NS) 0.9 % injection 3 mL, 3 mL, Intravenous, Once, Pollina, Gwenyth Allegra, MD   ASSESSMENT/PLAN [name] is a 75 y.o. male with history of HTN, HLD presenting with new onset seizures and AMS. 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    MRI brain reviewed: Does not have the typical appearance of cerebral amyloid angiopathy. There was concern for micro-AVM but diagnostic cerebral angiogram done by Dr. Estanislado Pandy reports no AVM aneurysm or dural AV fistula.  ICH has been stable on multiple CTs and MRIs done throughout the course of this hospitalization for  now.    ICH: right temporal lobe ICH, etiology likely hypertensive,   CT head right temporal lobe ICH about 5cc  CTA head & neck stable hematoma, 7 mm blush of contrast at the posterior right temporal lobe, immediately adjacent to the right temporal lobe hemorrhage  Cerebral angiogram done (06/26/2020): no AVM aneurysm or dural AV fistula reported-CT angio rules out AVM.  Repeat CT 06/27/2020 with stable 2.8 cm IPH within the posterior right temporal lobe with surrounding mild mass-effect.  No midline shift.  LDL 76  HgbA1c 5.6  VTE prophylaxis - SCDs  2D echo: EF 50-55%, moderate biatrial dilation  ASA 81mg  prior to admission, now no AP/AC d/t bleed  Therapy recommendations:  OT recs CIR,   Disposition:  CIR or home depending on how he does over the next few days.  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 D/C EEG leads 03/08/202  Bradycardia resolved with discontinuation of Precedex.  Hypertension/hypertensive emergency-hypertensive emergency resolved.  Continuing to work on optimizing blood pressures.  Home meds:  Maxzide, Coreg, Zestril, Hydrodiuril-unclear compliance-suspect non-compliance to meds. [place] records not available. . On lisinopril 40.   Amlodipine 10.  Carvedilol 3.125 which has been held few times due to bradycardia.  Hydrochlorothiazide 12.5.  Hydralazine 50 p.o. 3 times daily was added yesterday.  Can increase to hydralazine 100 3 times daily. . SBP goal less than 160.  Eventually will require normotension.  Sinus Bradycardia  Worsened while on high dose precedex and better after discontinuation  Continue to monitor telemetry  Appears asymptomatic at this time  Hyperlipidemia  Home meds:  Lovaza, Q10  LDL 76, goal < 70  Consider statin at discharge  Dysphagia   Passed bedside swallow eval  Speech on board  PTSD, history  Sertraline 50 mg bid  Abilify 10 mg daily  Clonazepam 0.5mg  bid prn  Hypokalemia-resolved  Thrombocytopenia  Platelet count 125 prior admission  Platelet count 145->102->95 ->92-->98-->105 (closer to baseline now)  Repeat level in am and continue to monitor.  No evidence of active bleeding.  No indication for transfusion at this time.  Other Stroke Risk Factors  Advanced Age >/= 65   OSA on CPAP at home  Previous Cigarette smoker  Obesity, Body mass index is 31.03 kg/m., BMI >/= 30 associated with increased stroke risk, recommend weight loss, diet and exercise as appropriate   Other Active Problems  PTSD- high risk for delirium. Should resume mood/SSRi meds when able  Prostate Cancer-MRI brain negative for any underlying metastatic lesion at this time.  Chronic urinary retention- resume oxybutynin when able  Grief: Recently lost his wife. He has been having difficulty managing alone. He is visiting his two daughters that live in [place] from [place], [place] [facility]-orders placed in the chart-pending chart availability.  ConsultS: PCCM-appreciate consultation and signed off 06/30/20.     Transfer orders to floor orders are already in  Likely discharge to rehab in [place] in next few days when bed is available,  greater than 50% time during this 25-minute visit was spent on counseling and coordination of care about  his intracerebral hemorrhage   Antony Contras, MD

## 2020-07-01 NOTE — Progress Notes (Signed)
Pt. discharged to [facility] rehab facility via daughter. Discharge instructions given to patient and daughter, Jassiah Viviano. Pt and daughter educated and all questions answered. All belongings sent with patient.

## 2020-09-06 ENCOUNTER — Telehealth (HOSPITAL_COMMUNITY): Payer: Self-pay

## 2020-09-06 NOTE — Telephone Encounter (Signed)
Called to schedule angiogram, no answer, no vm. AW  

## 2021-01-04 ENCOUNTER — Telehealth (HOSPITAL_COMMUNITY): Payer: Self-pay

## 2021-01-04 NOTE — Telephone Encounter (Signed)
Called to schedule angiogram, no answer, no vm. AW  

## 2021-09-20 IMAGING — CT CT ANGIO HEAD
2 of 11 series · 7 of 33 positions shown · IV contrast (OMNI 350)
Comparison: Prior head CT from earlier the same day.

CLINICAL DATA: Follow-up acute intracranial hemorrhage.

EXAM:
CT ANGIOGRAPHY HEAD AND NECK
TECHNIQUE: Multidetector CT imaging of the head and neck was performed using
the standard protocol during bolus administration of intravenous
contrast. Multiplanar CT image reconstructions and MIPs were
obtained to evaluate the vascular anatomy. Carotid stenosis
measurements (when applicable) are obtained utilizing NASCET
criteria, using the distal internal carotid diameter as the
denominator.
CONTRAST:  75mL OMNIPAQUE IOHEXOL 350 MG/ML SOLN

[Series 9: cta neck · axial · 0.48mm/px · z∈[-194,-82]mm · 2 of 169 slices shown]
[im 57/169  soft-tissue]
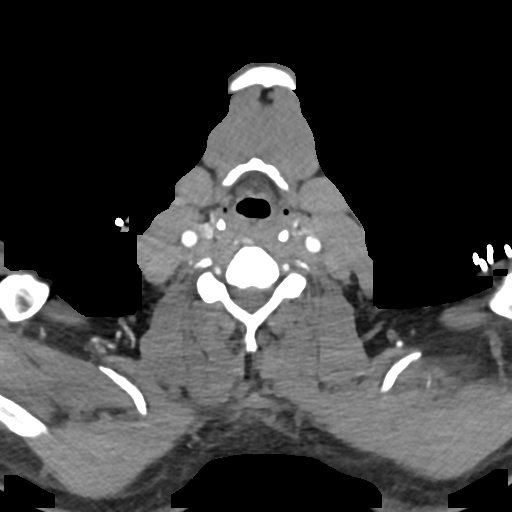
[im 113/169  soft-tissue]
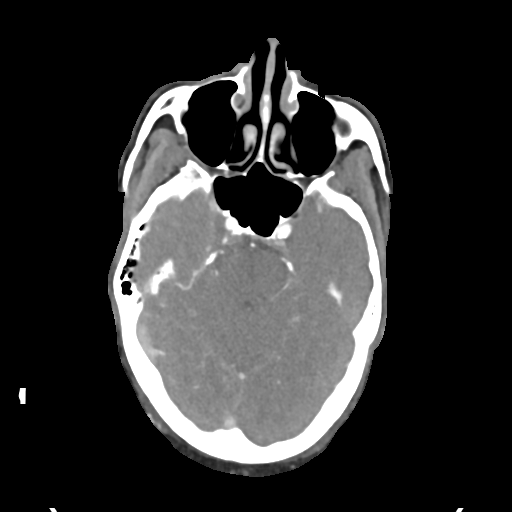

[Series 11: cta neck axial · axial · 0.39mm/px · z∈[-248,-24]mm · 5 of 337 slices shown]
[im 57/337  soft-tissue]
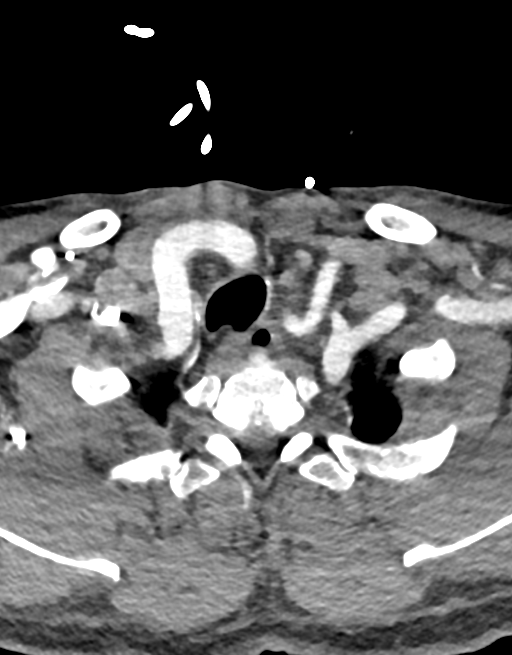
[im 113/337  bone]
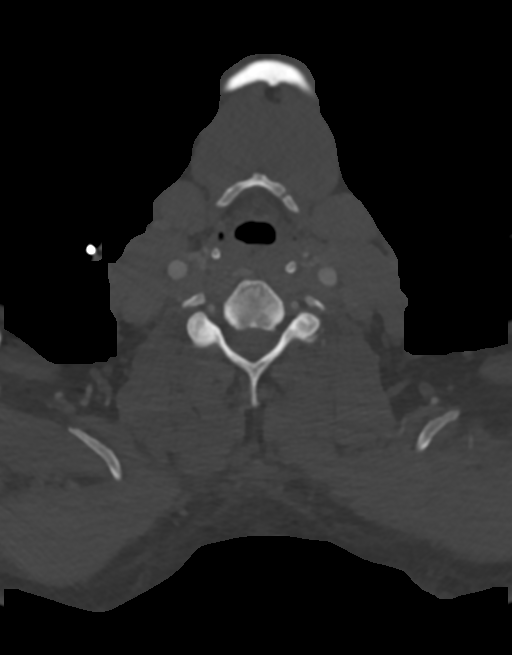
[im 169/337  soft-tissue]
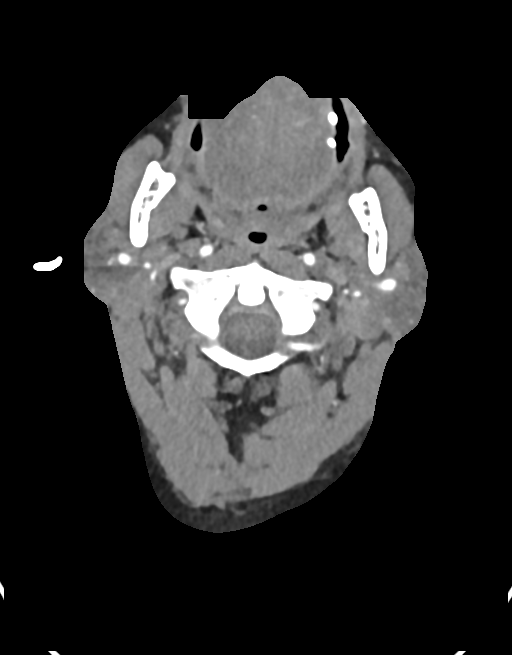
[im 225/337  bone]
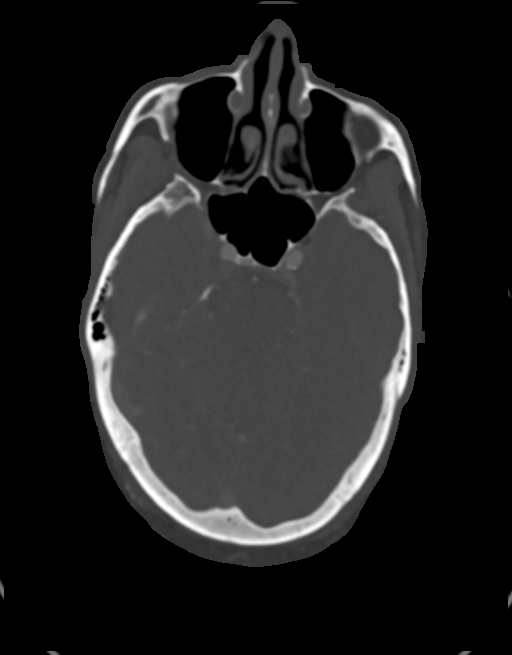
[im 281/337  soft-tissue]
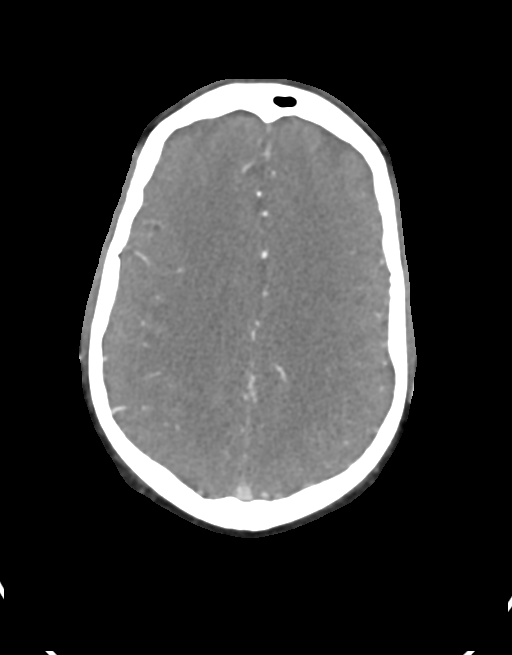

[7 of 33 positions shown; findings below may reference images not displayed]

FINDINGS: CT HEAD FINDINGS

Brain: No significant interval change in size and morphology of
estimated 5 cc intraparenchymal hemorrhage centered at the posterior
right temporal lobe. Mildly increased localized vasogenic edema
without significant regional mass effect.

No other new acute intracranial hemorrhage. No other acute large
vessel territory infarct. No mass lesion or midline shift. No
hydrocephalus or extra-axial fluid collection. Chronic microvascular
ischemic disease again noted.

Vascular: No hyperdense vessel. Scattered vascular calcifications
noted within the carotid siphons.

Skull: Scalp soft tissues and calvarium demonstrate no new finding.

Sinuses: Paranasal sinuses remain largely clear. Trace left mastoid
effusion noted.

Orbits: Globes and orbital soft tissues within normal limits.

CTA NECK FINDINGS

Aortic arch: Visualized arch normal in caliber with normal 3 vessel
morphology. Mild plaque about the arch with no hemodynamically
significant stenosis about the origin the great vessels.

Right carotid system: Right common and internal carotid arteries
widely patent without stenosis, dissection or occlusion. Minimal
plaque about the right bifurcation for age.

Left carotid system: Left common and internal carotid arteries
widely patent without stenosis, dissection or occlusion. Minimal
plaque about the left bifurcation for age.

Vertebral arteries: Both vertebral arteries arise from the
subclavian arteries. No proximal subclavian artery stenosis. Both
vertebral arteries widely patent without stenosis, dissection or
occlusion.

Skeleton: No acute osseous finding. No discrete or worrisome osseous
lesions.

Other neck: No other acute soft tissue abnormality within the neck.
No mass or adenopathy.

Upper chest: Scattered atelectatic changes noted within the
visualized lungs. Visualized upper chest demonstrates no other acute
finding.

Review of the MIP images confirms the above findings

CTA HEAD FINDINGS

Anterior circulation: Petrous segments widely patent. Mild
atheromatous change within the carotid siphons without
hemodynamically significant stenosis. A1 segments patent
bilaterally. Normal anterior communicating artery complex. Anterior
cerebral arteries patent to their distal aspects without stenosis.
No M1 stenosis or occlusion. Normal MCA bifurcations. Distal MCA
branches well perfused bilaterally.

Posterior circulation: Both V4 segments patent to the
vertebrobasilar junction, with the left being slightly dominant.
Both PICA origins patent and normal. Basilar diminutive but patent
to its distal aspect without stenosis. Superior cerebellar arteries
patent bilaterally. Fetal type origin of the PCAs bilaterally. PCAs
well perfused to their distal aspects without stenosis.

Venous sinuses: Patent.

Anatomic variants: None significant. There is an apparent 7 mm blush
of contrast at the posterior right temporal lobe immediately
superior to the right temporal lobe hemorrhage (series 11, image
86). Finding is favored to reflect a focal venous confluence. No
other AVM or other vascular abnormality seen underlying the acute
intraparenchymal bleed.

Review of the MIP images confirms the above findings
IMPRESSION: 1. No significant interval change in size and morphology of
estimated 5 cc intraparenchymal hemorrhage centered at the posterior
right temporal lobe. Mildly increased localized vasogenic edema
without significant regional mass effect. No other new acute
intracranial abnormality.
2. 7 mm blush of contrast at the posterior right temporal lobe,
immediately adjacent to the right temporal lobe hemorrhage. While
this is favored to reflect a focal venous confluence, confirmation
with dedicated catheter directed angiography suggested for
confirmatory purposes given its close proximity to the adjacent
bleed.
3. Mild atherosclerotic change about the carotid bifurcations and
carotid siphons without hemodynamically significant stenosis.
4. Otherwise negative CTA of the head and neck. No large vessel
occlusion, hemodynamically significant stenosis, or other acute
vascular abnormality.
